# Patient Record
Sex: Male | Born: 1935 | Race: White | Hispanic: No | Smoking: Never smoker
Health system: Southern US, Community
[De-identification: ages and names within clinical notes are randomized; demographics above are authoritative.]

## PROBLEM LIST (undated history)

## (undated) DIAGNOSIS — C4490 Unspecified malignant neoplasm of skin, unspecified: Secondary | ICD-10-CM

## (undated) DIAGNOSIS — E785 Hyperlipidemia, unspecified: Secondary | ICD-10-CM

## (undated) DIAGNOSIS — N400 Enlarged prostate without lower urinary tract symptoms: Secondary | ICD-10-CM

## (undated) DIAGNOSIS — R7303 Prediabetes: Secondary | ICD-10-CM

## (undated) DIAGNOSIS — I10 Essential (primary) hypertension: Secondary | ICD-10-CM

## (undated) DIAGNOSIS — M1990 Unspecified osteoarthritis, unspecified site: Secondary | ICD-10-CM

## (undated) DIAGNOSIS — E559 Vitamin D deficiency, unspecified: Secondary | ICD-10-CM

## (undated) DIAGNOSIS — C679 Malignant neoplasm of bladder, unspecified: Secondary | ICD-10-CM

## (undated) HISTORY — DX: Malignant neoplasm of bladder, unspecified: C67.9

## (undated) HISTORY — DX: Essential (primary) hypertension: I10

## (undated) HISTORY — PX: CATARACT EXTRACTION: SUR2

## (undated) HISTORY — DX: Benign prostatic hyperplasia without lower urinary tract symptoms: N40.0

## (undated) HISTORY — DX: Hyperlipidemia, unspecified: E78.5

## (undated) HISTORY — DX: Prediabetes: R73.03

## (undated) HISTORY — DX: Unspecified osteoarthritis, unspecified site: M19.90

## (undated) HISTORY — DX: Vitamin D deficiency, unspecified: E55.9

## (undated) HISTORY — PX: TONSILLECTOMY: SUR1361

---

## 1997-06-03 ENCOUNTER — Other Ambulatory Visit: Admission: RE | Admit: 1997-06-03 | Discharge: 1997-06-03 | Payer: Self-pay | Admitting: *Deleted

## 1997-07-06 ENCOUNTER — Ambulatory Visit (HOSPITAL_COMMUNITY): Admission: RE | Admit: 1997-07-06 | Discharge: 1997-07-06 | Payer: Self-pay | Admitting: *Deleted

## 1997-08-04 ENCOUNTER — Other Ambulatory Visit: Admission: RE | Admit: 1997-08-04 | Discharge: 1997-08-04 | Payer: Self-pay | Admitting: *Deleted

## 1997-09-06 ENCOUNTER — Other Ambulatory Visit: Admission: RE | Admit: 1997-09-06 | Discharge: 1997-09-06 | Payer: Self-pay | Admitting: *Deleted

## 1999-08-15 ENCOUNTER — Ambulatory Visit (HOSPITAL_COMMUNITY): Admission: RE | Admit: 1999-08-15 | Discharge: 1999-08-15 | Payer: Self-pay | Admitting: *Deleted

## 2000-01-22 ENCOUNTER — Encounter: Payer: Self-pay | Admitting: Internal Medicine

## 2000-01-22 ENCOUNTER — Encounter: Admission: RE | Admit: 2000-01-22 | Discharge: 2000-01-22 | Payer: Self-pay | Admitting: Internal Medicine

## 2001-02-18 DIAGNOSIS — C679 Malignant neoplasm of bladder, unspecified: Secondary | ICD-10-CM

## 2001-02-18 HISTORY — PX: CYSTO: SHX6284

## 2001-02-18 HISTORY — DX: Malignant neoplasm of bladder, unspecified: C67.9

## 2001-08-03 ENCOUNTER — Encounter: Payer: Self-pay | Admitting: Urology

## 2001-08-03 ENCOUNTER — Encounter: Admission: RE | Admit: 2001-08-03 | Discharge: 2001-08-03 | Payer: Self-pay | Admitting: Urology

## 2001-08-05 ENCOUNTER — Encounter (INDEPENDENT_AMBULATORY_CARE_PROVIDER_SITE_OTHER): Payer: Self-pay | Admitting: Specialist

## 2001-08-05 ENCOUNTER — Ambulatory Visit (HOSPITAL_BASED_OUTPATIENT_CLINIC_OR_DEPARTMENT_OTHER): Admission: RE | Admit: 2001-08-05 | Discharge: 2001-08-05 | Payer: Self-pay | Admitting: Urology

## 2003-02-25 ENCOUNTER — Encounter: Admission: RE | Admit: 2003-02-25 | Discharge: 2003-02-25 | Payer: Self-pay | Admitting: Urology

## 2003-03-02 ENCOUNTER — Ambulatory Visit (HOSPITAL_BASED_OUTPATIENT_CLINIC_OR_DEPARTMENT_OTHER): Admission: RE | Admit: 2003-03-02 | Discharge: 2003-03-02 | Payer: Self-pay | Admitting: Urology

## 2003-03-02 ENCOUNTER — Encounter (INDEPENDENT_AMBULATORY_CARE_PROVIDER_SITE_OTHER): Payer: Self-pay | Admitting: Specialist

## 2003-03-02 ENCOUNTER — Ambulatory Visit (HOSPITAL_COMMUNITY): Admission: RE | Admit: 2003-03-02 | Discharge: 2003-03-02 | Payer: Self-pay | Admitting: Urology

## 2004-06-12 ENCOUNTER — Encounter: Admission: RE | Admit: 2004-06-12 | Discharge: 2004-06-12 | Payer: Self-pay | Admitting: Urology

## 2004-06-20 ENCOUNTER — Ambulatory Visit (HOSPITAL_BASED_OUTPATIENT_CLINIC_OR_DEPARTMENT_OTHER): Admission: RE | Admit: 2004-06-20 | Discharge: 2004-06-20 | Payer: Self-pay | Admitting: Urology

## 2004-06-20 ENCOUNTER — Encounter (INDEPENDENT_AMBULATORY_CARE_PROVIDER_SITE_OTHER): Payer: Self-pay | Admitting: Specialist

## 2004-06-20 ENCOUNTER — Ambulatory Visit (HOSPITAL_COMMUNITY): Admission: RE | Admit: 2004-06-20 | Discharge: 2004-06-20 | Payer: Self-pay | Admitting: Urology

## 2005-06-06 ENCOUNTER — Ambulatory Visit (HOSPITAL_COMMUNITY): Admission: RE | Admit: 2005-06-06 | Discharge: 2005-06-06 | Payer: Self-pay | Admitting: Internal Medicine

## 2005-12-11 ENCOUNTER — Encounter (INDEPENDENT_AMBULATORY_CARE_PROVIDER_SITE_OTHER): Payer: Self-pay | Admitting: *Deleted

## 2005-12-11 ENCOUNTER — Ambulatory Visit (HOSPITAL_BASED_OUTPATIENT_CLINIC_OR_DEPARTMENT_OTHER): Admission: RE | Admit: 2005-12-11 | Discharge: 2005-12-11 | Payer: Self-pay | Admitting: Urology

## 2009-10-13 ENCOUNTER — Ambulatory Visit (HOSPITAL_BASED_OUTPATIENT_CLINIC_OR_DEPARTMENT_OTHER): Admission: RE | Admit: 2009-10-13 | Discharge: 2009-10-13 | Payer: Self-pay | Admitting: Urology

## 2010-05-03 LAB — POCT I-STAT, CHEM 8
BUN: 31 mg/dL — ABNORMAL HIGH (ref 6–23)
HCT: 39 % (ref 39.0–52.0)
Hemoglobin: 13.3 g/dL (ref 13.0–17.0)
Potassium: 3.9 mEq/L (ref 3.5–5.1)
Sodium: 142 mEq/L (ref 135–145)
TCO2: 23 mmol/L (ref 0–100)

## 2010-07-06 NOTE — Op Note (Signed)
NAMEBENEN, WEIDA                   ACCOUNT NO.:  [id number]   MEDICAL RECORD NO.:  [id number]          PATIENT TYPE:  AMB   LOCATION:  [facility]                         FACILITY:  [facility]   PHYSICIAN:  Maretta Bees. Vonita Moss, M.D.DATE OF BIRTH:  1922-09-07   DATE OF PROCEDURE:  06/20/2004  DATE OF DISCHARGE:                                 OPERATIVE REPORT   PREOPERATIVE DIAGNOSIS:  Rule out recurrent bladder carcinoma.   POSTOPERATIVE DIAGNOSIS:  Rule out recurrent bladder carcinoma.   PROCEDURE:  1.  Cystoscopy.  2.  Bilateral retrograde pyelograms with interpretation.  3.  Cold cup bladder biopsy.   SURGEON:  Larey Dresser, M.D.   ANESTHESIA:  General.   INDICATIONS:  This 77 year old gentleman had bladder carcinoma resected in  June of 2003 and also had bladder biopsies done in January of 2005.  He has  got some erythematous areas in the bladder and hopefully this would just be  reflective of the inflammation and reactive tissue in his January, 2005  biopsy.   PROCEDURE:  The patient was brought to the operating room and placed in the  lithotomy position.  External genitalia were prepped and draped in the usual  fashion using a cystoscope.  The anterior and prostatic urethra were  unremarkable.   Bilateral retrograde pyelograms were obtained using the cone tip catheter  and there was no evidence of obstruction or filling defects in either upper  tract.   He then underwent cold cup bladder biopsy of a tiny frond-like lesion on the  trigone, this was about no more than 0.5 cm in size.  There were two areas  that were also biopsied that were erythematous and measuring about 1 to 1.5  cm each.  One was on the dome and one was on the right wall and  representative areas of each one were biopsied  with the cold cup biopsy forceps and the surrounding erythematous mucosa and  biopsy site were completely fulgurated with the Bugbee electrode.  At this  point, the bladder was emptied, the  scope removed and the patient sent to  the recovery room in good condition having tolerated the procedure well.      LJP/MEDQ  D:  06/20/2004  T:  06/20/2004  Job:  [id number]   cc:   Lovenia Kim, D.O.  [address]  [place]  [place] [address]  Fax: [phone]

## 2010-07-06 NOTE — Op Note (Signed)
Philip Simpson, Philip Simpson                   ACCOUNT NO.:  [id number]   MEDICAL RECORD NO.:  [id number]          PATIENT TYPE:  AMB   LOCATION:  [facility]                         FACILITY:  [facility]   PHYSICIAN:  Maretta Bees. Vonita Moss, M.D.DATE OF BIRTH:  19-Jan-1923   DATE OF PROCEDURE:  12/11/2005  DATE OF DISCHARGE:                                 OPERATIVE REPORT   PREOPERATIVE DIAGNOSIS:  Rule out recurrent bladder carcinoma.   POSTOPERATIVE DIAGNOSIS:  Rule out recurrent bladder carcinoma.   PROCEDURE:  Cystoscopy, bilateral retrograde pyelograms with interpretation  and cold cup bladder biopsy.   SURGEON:  Dr. Larey Dresser.   ANESTHESIA:  General.   INDICATIONS:  This is an 77 year old gentleman has had a history of bladder  carcinoma dating back to 2003 and has had a follow-up biopsy showing  inflammation and reactive tissue.  His last biopsy procedure was May 2006.  Recent cystoscopy revealed some couple of erythematous areas and some small  raised area in the dome suspicious for tumor.  Brought to the OR today for  further treatment and evaluation.   PROCEDURE:  The patient brought to the operating room, placed lithotomy  position.  External genitalia were prepped and draped in usual fashion.  He  was cystoscoped.  The anterior and prostatic urethras were unremarkable.  He  had an erythematous area on the anterior wall and a couple of erythematous  areas on the dome one of which was slightly raised mucosa.   Bilateral retrograde pyelograms were performed by injecting contrast through  the cystoscope by means of a cone-tip catheter.  The pyelocaliceal systems  and ureters were delicate without filling defects.  The calyces were full  which I think reflect a variation of normal and no obstruction.   The cold cup bladder biopsy forceps was used to biopsy the two areas on the  dome and the one area on the anterior wall, each of which was 0.5 to 1 cm in  size.  The biopsy sites were  fulgurated with the Bugbee electrode.  At this  point there was no evidence of other lesions noted.  There was good  hemostasis.  Bladder was emptied. the scope removed and the patient sent to  recovery room in good condition having tolerated the procedure well.      Maretta Bees. Vonita Moss, M.D.  Electronically Signed     LJP/MEDQ  D:  12/11/2005  T:  12/12/2005  Job:  [id number]

## 2010-07-06 NOTE — Op Note (Signed)
NAME:  Philip Simpson, Philip Simpson                             ACCOUNT NO.:  [id number]   MEDICAL RECORD NO.:  [id number]                   PATIENT TYPE:  AMB   LOCATION:  [facility]                                 FACILITY:  [facility]   PHYSICIAN:  Maretta Bees. Vonita Moss, M.D.             DATE OF BIRTH:  Dec 30, 1922   DATE OF PROCEDURE:  03/02/2003  DATE OF DISCHARGE:                                 OPERATIVE REPORT   PREOPERATIVE DIAGNOSIS:  Rule out recurrent bladder carcinoma.   POSTOPERATIVE DIAGNOSIS:  Rule out recurrent bladder carcinoma.   PROCEDURE:  Cystoscopy, bilateral retrograde pyelograms with interpretation  and cold cup bladder biopsies.   SURGEON:  Maretta Bees. Vonita Moss, M.D.   ANESTHESIA:  General.   INDICATIONS FOR PROCEDURE:  This 77 year old white male had a bladder  carcinoma resected in June of 2003.  It was lateral to the right ureteral  orifice. He has been cystoscope in followup and there has been no evidence  of recurrences.  On Cysto on February 08, 2003, there were a couple of  erythematous area and an NMP-22 was done which was positive which lead to  his evaluation today.   DESCRIPTION OF PROCEDURE:  The patient was brought to the operating room,  placed in lithotomy position, external genitalia were prepped and draped in  the usual fashion. He was cystoscope, the anterior urethra was normal, the  prostate had a partial lateral lobe hypertrophy.  The bladder had some mild  erythematous areas on the right lower bladder wall and one small  erythematous area on the base.  I performed bilateral retrograde pyelograms  using the cone tip catheter in the ureters and pyelocaliceal systems had no  filling defects or hydronephrosis. There was some extravasation of contrast  in the perirenal region at the end of the procedure but that should not  cause any problems.  I then perform cold cup bladder biopsies taking two  specimens from the right lower wall, one specimen from the base and  erythematous areas and a random biopsy from the left wall.  Biopsies sites  were fulgurated with the Bugbee electrode and at this point the bladder was  emptied, the scope removed and there was good hemostasis with no blood loss  and he tolerated the procedure well.                                               Maretta Bees. Vonita Moss, M.D.    LJP/MEDQ  D:  03/02/2003  T:  03/02/2003  Job:  [id number]   cc:   Lovenia Kim, D.O.  [address]  [place]  [place] [address]  Fax: [phone]

## 2010-07-06 NOTE — Op Note (Signed)
[facility]  Patient:    Philip Simpson, Philip Simpson Visit Number: [id number] MRN: [id number]          Service Type: N[facility] Location: [facility] Attending Physician:  Lauree Chandler Dictated by:   Maretta Bees. Vonita Moss, M.D. Proc. Date: 08/05/01 Admit Date:  08/05/2001                             Operative Report  PREOPERATIVE DIAGNOSIS:  Bladder carcinoma.  POSTOPERATIVE DIAGNOSIS:  Bladder carcinoma.  OPERATION:  Cystoscopy, bilateral retrograde pyelogram with interpretation and resection and fulguration of bladder tumor.  SURGEON:  Maretta Bees. Vonita Moss, M.D.  ANESTHESIA:  General  INDICATIONS:  This 77 year old, gentleman has had workup for hematuria and a renal ultrasound was unremarkable except for right renal cyst.  Cystoscopy revealed a slightly raised pinkish lesion lateral to the right ureteral orifice which was worrisome for bladder carcinoma.  He is brought to the ER today for further treatment therapy.  DESCRIPTION OF PROCEDURE:  The patient was brought to the operating room placed in the lithotomy position.  External genitalia were prepped and draped in the usual fashion.  He was cystoscoped.  The anterior urethra was normal. The prostate had partial obstruction.  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  Bilateral retrograde pyelogram were obtained using the cone tip catheter and the upper tracts were unremarkable with no stones, obstruction or filling defects.  I then used the cold cup bladder biopsy forceps to biopsy and resect representative areas of this tumor.  This was sent for pathology.  The unresected areas and surrounding mucosa were fulgurated with the Bugbee electrode and this encompassed about and area 2 cm in diameter.  At this point, there were no other lesions in the  bladder.  The ureteral orifices were intact.  Bladder was emptied.  The scope removed and the patient sent to the recovery room in good condition, having tolerated the procedure well. Dictated by:   Maretta Bees. Vonita Moss, M.D. Attending Physician:  Lauree Chandler DD:  08/05/01 TD:  08/06/01 Job: [id number]

## 2011-02-19 DIAGNOSIS — C4490 Unspecified malignant neoplasm of skin, unspecified: Secondary | ICD-10-CM

## 2011-02-19 HISTORY — DX: Unspecified malignant neoplasm of skin, unspecified: C44.90

## 2011-10-23 ENCOUNTER — Other Ambulatory Visit: Payer: Self-pay | Admitting: Dermatology

## 2013-01-27 ENCOUNTER — Encounter: Payer: Self-pay | Admitting: Internal Medicine

## 2013-01-27 DIAGNOSIS — E785 Hyperlipidemia, unspecified: Secondary | ICD-10-CM | POA: Insufficient documentation

## 2013-01-27 DIAGNOSIS — N400 Enlarged prostate without lower urinary tract symptoms: Secondary | ICD-10-CM | POA: Insufficient documentation

## 2013-01-27 DIAGNOSIS — R7303 Prediabetes: Secondary | ICD-10-CM | POA: Insufficient documentation

## 2013-01-27 DIAGNOSIS — I10 Essential (primary) hypertension: Secondary | ICD-10-CM | POA: Insufficient documentation

## 2013-01-27 DIAGNOSIS — E559 Vitamin D deficiency, unspecified: Secondary | ICD-10-CM | POA: Insufficient documentation

## 2013-01-27 DIAGNOSIS — M1990 Unspecified osteoarthritis, unspecified site: Secondary | ICD-10-CM | POA: Insufficient documentation

## 2013-02-02 ENCOUNTER — Encounter: Payer: Self-pay | Admitting: Internal Medicine

## 2013-02-02 ENCOUNTER — Ambulatory Visit (INDEPENDENT_AMBULATORY_CARE_PROVIDER_SITE_OTHER): Payer: Medicare PPO | Admitting: Internal Medicine

## 2013-02-02 VITALS — BP 140/62 | HR 72 | Temp 97.9°F | Resp 16 | Wt 172.8 lb

## 2013-02-02 DIAGNOSIS — Z79899 Other long term (current) drug therapy: Secondary | ICD-10-CM

## 2013-02-02 DIAGNOSIS — I10 Essential (primary) hypertension: Secondary | ICD-10-CM

## 2013-02-02 DIAGNOSIS — R7309 Other abnormal glucose: Secondary | ICD-10-CM

## 2013-02-02 DIAGNOSIS — E559 Vitamin D deficiency, unspecified: Secondary | ICD-10-CM

## 2013-02-02 DIAGNOSIS — E782 Mixed hyperlipidemia: Secondary | ICD-10-CM

## 2013-02-02 LAB — CBC WITH DIFFERENTIAL/PLATELET
Basophils Relative: 1 % (ref 0–1)
Eosinophils Absolute: 0.1 10*3/uL (ref 0.0–0.7)
Lymphocytes Relative: 19 % (ref 12–46)
Lymphs Abs: 1 10*3/uL (ref 0.7–4.0)
MCHC: 33.7 g/dL (ref 30.0–36.0)
MCV: 97.3 fL (ref 78.0–100.0)
Monocytes Absolute: 0.5 10*3/uL (ref 0.1–1.0)
Monocytes Relative: 9 % (ref 3–12)
Neutro Abs: 3.8 10*3/uL (ref 1.7–7.7)
Platelets: 201 10*3/uL (ref 150–400)
RBC: 3.69 MIL/uL — ABNORMAL LOW (ref 4.22–5.81)

## 2013-02-02 NOTE — Progress Notes (Signed)
Patient ID: Philip Simpson, male   DOB: Nov 22, 1922, 79 y.o.   MRN: [id number]   This very nice 79 yo WWM living alone who presents for 3 month follow up with Hypertension, Hyperlipidemia, Pre-Diabetes and Vitamin D Deficiency.    BP has been controlled at home. Today's BP is 140/62. Patient denies any cardiac type chest pain, palpitations, dyspnea/orthopnea/PND, dizziness, claudication, or dependent edema.   Hyperlipidemia is controlled with diet & meds. Last Cholesterol was 138, Triglycerides were 86, HDL 56, and LDL 71 in September. Patient denies myalgias or other med SE's.    Also, the patient has history of PreDiabetes/insulin resistance with last A1c of 6.0% in September. Patient denies any symptoms of reactive hypoglycemia, diabetic polys, paresthesias or visual blurring.   Further, Patient has history of Vitamin D Deficiency with last vitamin D of 34 in September. Patient supplements vitamin D without any suspected side-effects.  Medication Sig Dispense Refill  . amLODipine (NORVASC) 5 MG tablet Take 5 mg by mouth daily.      . benazepril (LOTENSIN) 40 MG tablet Take 40 mg by mouth daily.      . Cholecalciferol (VITAMIN D PO) Take 4,000 Int'l Units by mouth daily.      Marland Kitchen gemfibrozil (LOPID) 600 MG tablet Take 600 mg by mouth 2 (two) times daily before a meal.      . HYDROcodone-acetaminophen (NORCO/VICODIN) 5-325 MG per tablet Take 1 tablet by mouth every 4 (four) hours as needed for moderate pain.       Advil 200 mg  Takes 2-3 tabs daily           . terazosin (HYTRIN) 10 MG capsule Take 10 mg by mouth 2 (two) times daily.         Allergies  Allergen Reactions  . Baycol [Cerivastatin]   . Zocor [Simvastatin]     PMHx:   Past Medical History  Diagnosis Date  . Hyperlipidemia   . Hypertension   . Arthritis   . Bladder cancer 2003  . Prediabetes   . Vitamin D deficiency   . BPH (benign prostatic hyperplasia)     FHx:    Reviewed / unchanged  SHx:    Reviewed /  unchanged  Systems Review: Constitutional: Denies fever, chills, wt changes, headaches, insomnia, fatigue, night sweats, change in appetite. Eyes: Denies redness, blurred vision, diplopia, discharge, itchy, watery eyes.  ENT: Denies discharge, congestion, post nasal drip, epistaxis, sore throat, earache, hearing loss, dental pain, tinnitus, vertigo, sinus pain, snoring.  CV: Denies chest pain, palpitations, irregular heartbeat, syncope, dyspnea, diaphoresis, orthopnea, PND, claudication, edema. Respiratory: denies cough, dyspnea, DOE, pleurisy, hoarseness, laryngitis, wheezing.  Gastrointestinal: Denies dysphagia, odynophagia, heartburn, reflux, water brash, abdominal pain or cramps, nausea, vomiting, bloating, diarrhea, constipation, hematemesis, melena, hematochezia,  or hemorrhoids. Genitourinary: Denies dysuria, frequency, urgency, nocturia, hesitancy, discharge, hematuria, flank pain. Musculoskeletal: Denies arthralgias, myalgias, stiffness, jt. swelling, pain, limp, strain/sprain.  Skin: Denies pruritus, rash, hives, warts, acne, eczema, change in skin lesion(s). Neuro: No weakness, tremor, incoordination, spasms, paresthesia, or pain. Psychiatric: Denies confusion, memory loss, or sensory loss. Endo: Denies change in weight, skin, hair change.  Heme/Lymph: No excessive bleeding, bruising, orenlarged lymph nodes.  Filed Vitals:   02/02/13 1532  BP: 140/62  Pulse: 72  Temp: 97.9 F (36.6 C)  Resp: 16    There is no height on file to calculate BMI.  On Exam: Appears well nourished - in no distress. Eyes: PERRLA, EOMs, conjunctiva no swelling or erythema. Sinuses: No  frontal/maxillary tenderness ENT/Mouth: EAC's clear, TM's nl w/o erythema, bulging. Nares clear w/o erythema, swelling, exudates. Oropharynx clear without erythema or exudates. Oral hygiene is good. Tongue normal, non obstructing. Hearing intact.  Neck: Supple. Thyroid nl. Car 2+/2+ without bruits, nodes or  JVD. Chest: Respirations nl with BS clear & equal w/o rales, rhonchi, wheezing or stridor.  Cor: Heart sounds normal w/ regular rate and rhythm without sig. murmurs, gallops, clicks, or rubs. Peripheral pulses normal and equal  without edema.  Abdomen: Soft & bowel sounds normal. Non-tender w/o guarding, rebound, hernias, masses, or organomegaly.  Lymphatics: Unremarkable.  Musculoskeletal: Full ROM all peripheral extremities, joint stability, 5/5 strength, and normal gait.  Skin: Warm, dry without exposed rashes, lesions, ecchymosis apparent.  Neuro: Cranial nerves intact, reflexes equal bilaterally. Sensory-motor testing grossly intact. Tendon reflexes grossly intact.  Pysch: Alert & oriented x 3. Insight and judgement nl & appropriate. No ideations.  Assessment and Plan:  1. Hypertension - Continue monitor blood pressure at home. Continue diet/meds same.  2. Hyperlipidemia - Continue diet/meds, exercise,& lifestyle modifications. Continue monitor periodic cholesterol/liver & renal functions   3. Pre-diabetes/Insulin Resistance - Continue diet, exercise, lifestyle modifications. Monitor appropriate labs.  4. Vitamin D Deficiency - Continue supplementation.  Recommended regular exercise, BP monitoring, weight control, and discussed med and SE's. Recommended labs to assess and monitor clinical status. Further disposition pending results of labs.

## 2013-02-02 NOTE — Patient Instructions (Signed)
Hypertension As your heart beats, it forces blood through your arteries. This force is your blood pressure. If the pressure is too high, it is called hypertension (HTN) or high blood pressure. HTN is dangerous because you may have it and not know it. High blood pressure may mean that your heart has to work harder to pump blood. Your arteries may be narrow or stiff. The extra work puts you at risk for heart disease, stroke, and other problems.  Blood pressure consists of two numbers, a higher number over a lower, 110/72, for example. It is stated as "110 over 72." The ideal is below 120 for the top number (systolic) and under 80 for the bottom (diastolic). Write down your blood pressure today. You should pay close attention to your blood pressure if you have certain conditions such as:  Heart failure.  Prior heart attack.  Diabetes  Chronic kidney disease.  Prior stroke.  Multiple risk factors for heart disease. To see if you have HTN, your blood pressure should be measured while you are seated with your arm held at the level of the heart. It should be measured at least twice. A one-time elevated blood pressure reading (especially in the Emergency Department) does not mean that you need treatment. There may be conditions in which the blood pressure is different between your right and left arms. It is important to see your caregiver soon for a recheck. Most people have essential hypertension which means that there is not a specific cause. This type of high blood pressure may be lowered by changing lifestyle factors such as:  Stress.  Smoking.  Lack of exercise.  Excessive weight.  Drug/tobacco/alcohol use.  Eating less salt. Most people do not have symptoms from high blood pressure until it has caused damage to the body. Effective treatment can often prevent, delay or reduce that damage. TREATMENT  When a cause has been identified, treatment for high blood pressure is directed at the  cause. There are a large number of medications to treat HTN. These fall into several categories, and your caregiver will help you select the medicines that are best for you. Medications may have side effects. You should review side effects with your caregiver. If your blood pressure stays high after you have made lifestyle changes or started on medicines,   Your medication(s) may need to be changed.  Other problems may need to be addressed.  Be certain you understand your prescriptions, and know how and when to take your medicine.  Be sure to follow up with your caregiver within the time frame advised (usually within two weeks) to have your blood pressure rechecked and to review your medications.  If you are taking more than one medicine to lower your blood pressure, make sure you know how and at what times they should be taken. Taking two medicines at the same time can result in blood pressure that is too low. SEEK IMMEDIATE MEDICAL CARE IF:  You develop a severe headache, blurred or changing vision, or confusion.  You have unusual weakness or numbness, or a faint feeling.  You have severe chest or abdominal pain, vomiting, or breathing problems. MAKE SURE YOU:   Understand these instructions.  Will watch your condition.  Will get help right away if you are not doing well or get worse. Document Released: 02/04/2005 Document Revised: 04/29/2011 Document Reviewed: 09/25/2007 [employer] Patient Information 2014 [employer].  Diabetes and Exercise Exercising regularly is important. It is not just about losing weight. It   has many health benefits, such as:  Improving your overall fitness, flexibility, and endurance.  Increasing your bone density.  Helping with weight control.  Decreasing your body fat.  Increasing your muscle strength.  Reducing stress and tension.  Improving your overall health. People with diabetes who exercise gain additional benefits because  exercise:  Reduces appetite.  Improves the body's use of blood sugar (glucose).  Helps lower or control blood glucose.  Decreases blood pressure.  Helps control blood lipids (such as cholesterol and triglycerides).  Improves the body's use of the hormone insulin by:  Increasing the body's insulin sensitivity.  Reducing the body's insulin needs.  Decreases the risk for heart disease because exercising:  Lowers cholesterol and triglycerides levels.  Increases the levels of good cholesterol (such as high-density lipoproteins [HDL]) in the body.  Lowers blood glucose levels. YOUR ACTIVITY PLAN  Choose an activity that you enjoy and set realistic goals. Your health care provider or diabetes educator can help you make an activity plan that works for you. You can break activities into 2 or 3 sessions throughout the day. Doing so is as good as one long session. Exercise ideas include:  Taking the dog for a walk.  Taking the stairs instead of the elevator.  Dancing to your favorite song.  Doing your favorite exercise with a friend. RECOMMENDATIONS FOR EXERCISING WITH TYPE 1 OR TYPE 2 DIABETES   Check your blood glucose before exercising. If blood glucose levels are greater than 240 mg/dL, check for urine ketones. Do not exercise if ketones are present.  Avoid injecting insulin into areas of the body that are going to be exercised. For example, avoid injecting insulin into:  The arms when playing tennis.  The legs when jogging.  Keep a record of:  Food intake before and after you exercise.  Expected peak times of insulin action.  Blood glucose levels before and after you exercise.  The type and amount of exercise you have done.  Review your records with your health care provider. Your health care provider will help you to develop guidelines for adjusting food intake and insulin amounts before and after exercising.  If you take insulin or oral hypoglycemic agents, watch  for signs and symptoms of hypoglycemia. They include:  Dizziness.  Shaking.  Sweating.  Chills.  Confusion.  Drink plenty of water while you exercise to prevent dehydration or heat stroke. Body water is lost during exercise and must be replaced.  Talk to your health care provider before starting an exercise program to make sure it is safe for you. Remember, almost any type of activity is better than none. Document Released: 04/27/2003 Document Revised: 10/07/2012 Document Reviewed: 07/14/2012 [employer] Patient Information 2014 [employer].  Cholesterol Cholesterol is a white, waxy, fat-like protein needed by your body in small amounts. The liver makes all the cholesterol you need. It is carried from the liver by the blood through the blood vessels. Deposits (plaque) may build up on blood vessel walls. This makes the arteries narrower and stiffer. Plaque increases the risk for heart attack and stroke. You cannot feel your cholesterol level even if it is very high. The only way to know is by a blood test to check your lipid (fats) levels. Once you know your cholesterol levels, you should keep a record of the test results. Work with your caregiver to to keep your levels in the desired range. WHAT THE RESULTS MEAN:  Total cholesterol is a rough measure of all the cholesterol   in your blood.  LDL is the so-called bad cholesterol. This is the type that deposits cholesterol in the walls of the arteries. You want this level to be low.  HDL is the good cholesterol because it cleans the arteries and carries the LDL away. You want this level to be high.  Triglycerides are fat that the body can either burn for energy or store. High levels are closely linked to heart disease. DESIRED LEVELS:  Total cholesterol below 200.  LDL below 100 for people at risk, below 70 for very high risk.  HDL above 50 is good, above 60 is best.  Triglycerides below 150. HOW TO LOWER YOUR  CHOLESTEROL:  Diet.  Choose fish or white meat chicken and turkey, roasted or baked. Limit fatty cuts of red meat, fried foods, and processed meats, such as sausage and lunch meat.  Eat lots of fresh fruits and vegetables. Choose whole grains, beans, pasta, potatoes and cereals.  Use only small amounts of olive, corn or canola oils. Avoid butter, mayonnaise, shortening or palm kernel oils. Avoid foods with trans-fats.  Use skim/nonfat milk and low-fat/nonfat yogurt and cheeses. Avoid whole milk, cream, ice cream, egg yolks and cheeses. Healthy desserts include angel food cake, ginger snaps, animal crackers, hard candy, popsicles, and low-fat/nonfat frozen yogurt. Avoid pastries, cakes, pies and cookies.  Exercise.  A regular program helps decrease LDL and raises HDL.  Helps with weight control.  Do things that increase your activity level like gardening, walking, or taking the stairs.  Medication.  May be prescribed by your caregiver to help lowering cholesterol and the risk for heart disease.  You may need medicine even if your levels are normal if you have several risk factors. HOME CARE INSTRUCTIONS   Follow your diet and exercise programs as suggested by your caregiver.  Take medications as directed.  Have blood work done when your caregiver feels it is necessary. MAKE SURE YOU:   Understand these instructions.  Will watch your condition.  Will get help right away if you are not doing well or get worse. Document Released: 10/30/2000 Document Revised: 04/29/2011 Document Reviewed: 04/22/2007 [employer] Patient Information 2014 [employer].  Vitamin D Deficiency Vitamin D is an important vitamin that your body needs. Having too little of it in your body is called a deficiency. A very bad deficiency can make your bones soft and can cause a condition called rickets.  Vitamin D is important to your body for different reasons, such as:   It helps your body absorb 2  minerals called calcium and phosphorus.  It helps make your bones healthy.  It may prevent some diseases, such as diabetes and multiple sclerosis.  It helps your muscles and heart. You can get vitamin D in several ways. It is a natural part of some foods. The vitamin is also added to some dairy products and cereals. Some people take vitamin D supplements. Also, your body makes vitamin D when you are in the sun. It changes the sun's rays into a form of the vitamin that your body can use. CAUSES   Not eating enough foods that contain vitamin D.  Not getting enough sunlight.  Having certain digestive system diseases that make it hard to absorb vitamin D. These diseases include Crohn's disease, chronic pancreatitis, and cystic fibrosis.  Having a surgery in which part of the stomach or small intestine is removed.  Being obese. Fat cells pull vitamin D out of your blood. That means that obese people   may not have enough vitamin D left in their blood and in other body tissues.  Having chronic kidney or liver disease. RISK FACTORS Risk factors are things that make you more likely to develop a vitamin D deficiency. They include:  Being older.  Not being able to get outside very much.  Living in a nursing home.  Having had broken bones.  Having weak or thin bones (osteoporosis).  Having a disease or condition that changes how your body absorbs vitamin D.  Having dark skin.  Some medicines such as seizure medicines or steroids.  Being overweight or obese. SYMPTOMS Mild cases of vitamin D deficiency may not have any symptoms. If you have a very bad case, symptoms may include:  Bone pain.  Muscle pain.  Falling often.  Broken bones caused by a minor injury, due to osteoporosis. DIAGNOSIS A blood test is the best way to tell if you have a vitamin D deficiency. TREATMENT Vitamin D deficiency can be treated in different ways. Treatment for vitamin D deficiency depends on what is  causing it. Options include:  Taking vitamin D supplements.  Taking a calcium supplement. Your caregiver will suggest what dose is best for you. HOME CARE INSTRUCTIONS  Take any supplements that your caregiver prescribes. Follow the directions carefully. Take only the suggested amount.  Have your blood tested 2 months after you start taking supplements.  Eat foods that contain vitamin D. Healthy choices include:  Fortified dairy products, cereals, or juices. Fortified means vitamin D has been added to the food. Check the label on the package to be sure.  Fatty fish like salmon or trout.  Eggs.  Oysters.  Do not use a tanning bed.  Keep your weight at a healthy level. Lose weight if you need to.  Keep all follow-up appointments. Your caregiver will need to perform blood tests to make sure your vitamin D deficiency is going away. SEEK MEDICAL CARE IF:  You have any questions about your treatment.  You continue to have symptoms of vitamin D deficiency.  You have nausea or vomiting.  You are constipated.  You feel confused.  You have severe abdominal or back pain. MAKE SURE YOU:  Understand these instructions.  Will watch your condition.  Will get help right away if you are not doing well or get worse. Document Released: 04/29/2011 Document Revised: 06/01/2012 Document Reviewed: 04/29/2011 [employer] Patient Information 2014 [employer].  

## 2013-02-03 LAB — LIPID PANEL
Cholesterol: 135 mg/dL (ref 0–200)
HDL: 46 mg/dL (ref >39)
Total CHOL/HDL Ratio: 2.9 Ratio
Triglycerides: 94 mg/dL (ref <150)
VLDL: 19 mg/dL (ref 0–40)

## 2013-02-03 LAB — HEPATIC FUNCTION PANEL
ALT: 11 U/L (ref 0–53)
AST: 16 U/L (ref 0–37)
Albumin: 4.3 g/dL (ref 3.5–5.2)
Total Protein: 6.7 g/dL (ref 6.0–8.3)

## 2013-02-03 LAB — BASIC METABOLIC PANEL WITH GFR
BUN: 35 mg/dL — ABNORMAL HIGH (ref 6–23)
Chloride: 106 mEq/L (ref 96–112)
GFR, Est African American: 71 mL/min
GFR, Est Non African American: 62 mL/min
Sodium: 138 mEq/L (ref 135–145)

## 2013-02-03 LAB — HEMOGLOBIN A1C: Hgb A1c MFr Bld: 5.8 % — ABNORMAL HIGH (ref <5.7)

## 2013-02-03 LAB — MAGNESIUM: Magnesium: 2.1 mg/dL (ref 1.5–2.5)

## 2013-02-03 LAB — VITAMIN D 25 HYDROXY (VIT D DEFICIENCY, FRACTURES): Vit D, 25-Hydroxy: 23 ng/mL — ABNORMAL LOW (ref 30–89)

## 2013-02-03 LAB — INSULIN, FASTING: Insulin fasting, serum: 30 u[IU]/mL — ABNORMAL HIGH (ref 3–28)

## 2013-02-09 ENCOUNTER — Other Ambulatory Visit: Payer: Self-pay | Admitting: Internal Medicine

## 2013-04-25 ENCOUNTER — Emergency Department (HOSPITAL_COMMUNITY): Payer: Medicare PPO

## 2013-04-25 ENCOUNTER — Inpatient Hospital Stay (HOSPITAL_COMMUNITY)
Admission: EM | Admit: 2013-04-25 | Discharge: 2013-05-05 | DRG: 981 | Disposition: A | Discharge status: Skilled Nursing Facility | Payer: Medicare PPO | Attending: Family Medicine | Admitting: Family Medicine

## 2013-04-25 ENCOUNTER — Encounter (HOSPITAL_COMMUNITY): Payer: Self-pay | Admitting: Emergency Medicine

## 2013-04-25 DIAGNOSIS — R571 Hypovolemic shock: Secondary | ICD-10-CM

## 2013-04-25 DIAGNOSIS — Z602 Problems related to living alone: Secondary | ICD-10-CM

## 2013-04-25 DIAGNOSIS — F29 Unspecified psychosis not due to a substance or known physiological condition: Secondary | ICD-10-CM | POA: Diagnosis present

## 2013-04-25 DIAGNOSIS — Z9849 Cataract extraction status, unspecified eye: Secondary | ICD-10-CM

## 2013-04-25 DIAGNOSIS — E8809 Other disorders of plasma-protein metabolism, not elsewhere classified: Secondary | ICD-10-CM | POA: Diagnosis present

## 2013-04-25 DIAGNOSIS — G9340 Encephalopathy, unspecified: Secondary | ICD-10-CM | POA: Diagnosis present

## 2013-04-25 DIAGNOSIS — S02401A Maxillary fracture, unspecified, initial encounter for closed fracture: Secondary | ICD-10-CM | POA: Diagnosis present

## 2013-04-25 DIAGNOSIS — I5033 Acute on chronic diastolic (congestive) heart failure: Secondary | ICD-10-CM | POA: Diagnosis not present

## 2013-04-25 DIAGNOSIS — S0230XA Fracture of orbital floor, unspecified side, initial encounter for closed fracture: Secondary | ICD-10-CM | POA: Diagnosis present

## 2013-04-25 DIAGNOSIS — E785 Hyperlipidemia, unspecified: Secondary | ICD-10-CM

## 2013-04-25 DIAGNOSIS — D500 Iron deficiency anemia secondary to blood loss (chronic): Secondary | ICD-10-CM | POA: Diagnosis present

## 2013-04-25 DIAGNOSIS — Z8249 Family history of ischemic heart disease and other diseases of the circulatory system: Secondary | ICD-10-CM

## 2013-04-25 DIAGNOSIS — N400 Enlarged prostate without lower urinary tract symptoms: Secondary | ICD-10-CM

## 2013-04-25 DIAGNOSIS — K5732 Diverticulitis of large intestine without perforation or abscess without bleeding: Secondary | ICD-10-CM

## 2013-04-25 DIAGNOSIS — I2510 Atherosclerotic heart disease of native coronary artery without angina pectoris: Secondary | ICD-10-CM | POA: Diagnosis present

## 2013-04-25 DIAGNOSIS — I4510 Unspecified right bundle-branch block: Secondary | ICD-10-CM | POA: Diagnosis present

## 2013-04-25 DIAGNOSIS — K5731 Diverticulosis of large intestine without perforation or abscess with bleeding: Secondary | ICD-10-CM | POA: Diagnosis present

## 2013-04-25 DIAGNOSIS — R55 Syncope and collapse: Secondary | ICD-10-CM | POA: Diagnosis present

## 2013-04-25 DIAGNOSIS — Z794 Long term (current) use of insulin: Secondary | ICD-10-CM

## 2013-04-25 DIAGNOSIS — M1610 Unilateral primary osteoarthritis, unspecified hip: Secondary | ICD-10-CM | POA: Diagnosis present

## 2013-04-25 DIAGNOSIS — D649 Anemia, unspecified: Secondary | ICD-10-CM

## 2013-04-25 DIAGNOSIS — E559 Vitamin D deficiency, unspecified: Secondary | ICD-10-CM

## 2013-04-25 DIAGNOSIS — S51009A Unspecified open wound of unspecified elbow, initial encounter: Secondary | ICD-10-CM | POA: Diagnosis present

## 2013-04-25 DIAGNOSIS — Z820 Family history of epilepsy and other diseases of the nervous system: Secondary | ICD-10-CM

## 2013-04-25 DIAGNOSIS — I214 Non-ST elevation (NSTEMI) myocardial infarction: Secondary | ICD-10-CM

## 2013-04-25 DIAGNOSIS — S0280XA Fracture of other specified skull and facial bones, unspecified side, initial encounter for closed fracture: Secondary | ICD-10-CM

## 2013-04-25 DIAGNOSIS — K449 Diaphragmatic hernia without obstruction or gangrene: Secondary | ICD-10-CM | POA: Diagnosis present

## 2013-04-25 DIAGNOSIS — R7989 Other specified abnormal findings of blood chemistry: Secondary | ICD-10-CM

## 2013-04-25 DIAGNOSIS — R413 Other amnesia: Secondary | ICD-10-CM | POA: Diagnosis present

## 2013-04-25 DIAGNOSIS — E119 Type 2 diabetes mellitus without complications: Secondary | ICD-10-CM | POA: Diagnosis present

## 2013-04-25 DIAGNOSIS — K649 Unspecified hemorrhoids: Secondary | ICD-10-CM | POA: Diagnosis present

## 2013-04-25 DIAGNOSIS — I509 Heart failure, unspecified: Secondary | ICD-10-CM | POA: Diagnosis present

## 2013-04-25 DIAGNOSIS — R4181 Age-related cognitive decline: Secondary | ICD-10-CM | POA: Diagnosis present

## 2013-04-25 DIAGNOSIS — E876 Hypokalemia: Secondary | ICD-10-CM

## 2013-04-25 DIAGNOSIS — K5733 Diverticulitis of large intestine without perforation or abscess with bleeding: Principal | ICD-10-CM | POA: Diagnosis present

## 2013-04-25 DIAGNOSIS — Z85828 Personal history of other malignant neoplasm of skin: Secondary | ICD-10-CM

## 2013-04-25 DIAGNOSIS — I10 Essential (primary) hypertension: Secondary | ICD-10-CM

## 2013-04-25 DIAGNOSIS — K5900 Constipation, unspecified: Secondary | ICD-10-CM | POA: Diagnosis not present

## 2013-04-25 DIAGNOSIS — R7303 Prediabetes: Secondary | ICD-10-CM

## 2013-04-25 DIAGNOSIS — E872 Acidosis, unspecified: Secondary | ICD-10-CM | POA: Diagnosis not present

## 2013-04-25 DIAGNOSIS — M1990 Unspecified osteoarthritis, unspecified site: Secondary | ICD-10-CM

## 2013-04-25 DIAGNOSIS — S0180XA Unspecified open wound of other part of head, initial encounter: Secondary | ICD-10-CM | POA: Diagnosis present

## 2013-04-25 DIAGNOSIS — D62 Acute posthemorrhagic anemia: Secondary | ICD-10-CM

## 2013-04-25 DIAGNOSIS — M25551 Pain in right hip: Secondary | ICD-10-CM

## 2013-04-25 DIAGNOSIS — R578 Other shock: Secondary | ICD-10-CM

## 2013-04-25 DIAGNOSIS — D696 Thrombocytopenia, unspecified: Secondary | ICD-10-CM | POA: Diagnosis not present

## 2013-04-25 DIAGNOSIS — S02400A Malar fracture unspecified, initial encounter for closed fracture: Secondary | ICD-10-CM | POA: Diagnosis present

## 2013-04-25 DIAGNOSIS — I5030 Unspecified diastolic (congestive) heart failure: Secondary | ICD-10-CM

## 2013-04-25 DIAGNOSIS — I5032 Chronic diastolic (congestive) heart failure: Secondary | ICD-10-CM | POA: Diagnosis present

## 2013-04-25 DIAGNOSIS — W19XXXA Unspecified fall, initial encounter: Secondary | ICD-10-CM | POA: Diagnosis present

## 2013-04-25 DIAGNOSIS — S0181XA Laceration without foreign body of other part of head, initial encounter: Secondary | ICD-10-CM

## 2013-04-25 DIAGNOSIS — R197 Diarrhea, unspecified: Secondary | ICD-10-CM | POA: Diagnosis present

## 2013-04-25 DIAGNOSIS — R778 Other specified abnormalities of plasma proteins: Secondary | ICD-10-CM

## 2013-04-25 DIAGNOSIS — M549 Dorsalgia, unspecified: Secondary | ICD-10-CM | POA: Diagnosis not present

## 2013-04-25 DIAGNOSIS — Z8551 Personal history of malignant neoplasm of bladder: Secondary | ICD-10-CM

## 2013-04-25 DIAGNOSIS — K922 Gastrointestinal hemorrhage, unspecified: Secondary | ICD-10-CM

## 2013-04-25 DIAGNOSIS — S0285XA Fracture of orbit, unspecified, initial encounter for closed fracture: Secondary | ICD-10-CM

## 2013-04-25 DIAGNOSIS — Z79899 Other long term (current) drug therapy: Secondary | ICD-10-CM

## 2013-04-25 DIAGNOSIS — R933 Abnormal findings on diagnostic imaging of other parts of digestive tract: Secondary | ICD-10-CM | POA: Diagnosis present

## 2013-04-25 HISTORY — DX: Unspecified malignant neoplasm of skin, unspecified: C44.90

## 2013-04-25 LAB — CBC WITH DIFFERENTIAL/PLATELET
BASOS PCT: 0 % (ref 0–1)
Basophils Absolute: 0 10*3/uL (ref 0.0–0.1)
EOS ABS: 0 10*3/uL (ref 0.0–0.7)
EOS PCT: 0 % (ref 0–5)
HEMATOCRIT: 28.8 % — AB (ref 39.0–52.0)
Hemoglobin: 10 g/dL — ABNORMAL LOW (ref 13.0–17.0)
LYMPHS ABS: 0.3 10*3/uL — AB (ref 0.7–4.0)
Lymphocytes Relative: 3 % — ABNORMAL LOW (ref 12–46)
MCH: 34.4 pg — AB (ref 26.0–34.0)
MCHC: 34.7 g/dL (ref 30.0–36.0)
MCV: 99 fL (ref 78.0–100.0)
MONOS PCT: 5 % (ref 3–12)
Monocytes Absolute: 0.5 10*3/uL (ref 0.1–1.0)
NEUTROS PCT: 91 % — AB (ref 43–77)
Neutro Abs: 9.2 10*3/uL — ABNORMAL HIGH (ref 1.7–7.7)
PLATELETS: 178 10*3/uL (ref 150–400)
RBC: 2.91 MIL/uL — ABNORMAL LOW (ref 4.22–5.81)
RDW: 13.4 % (ref 11.5–15.5)
WBC: 10.1 10*3/uL (ref 4.0–10.5)

## 2013-04-25 LAB — BASIC METABOLIC PANEL
BUN: 38 mg/dL — ABNORMAL HIGH (ref 6–23)
CO2: 21 meq/L (ref 19–32)
Calcium: 9.3 mg/dL (ref 8.4–10.5)
Chloride: 108 mEq/L (ref 96–112)
Creatinine, Ser: 0.8 mg/dL (ref 0.50–1.35)
GFR calc Af Amer: 90 mL/min — ABNORMAL LOW (ref >90)
GFR, EST NON AFRICAN AMERICAN: 77 mL/min — AB (ref >90)
GLUCOSE: 175 mg/dL — AB (ref 70–99)
POTASSIUM: 4.7 meq/L (ref 3.7–5.3)
Sodium: 143 mEq/L (ref 137–147)

## 2013-04-25 LAB — GLUCOSE, CAPILLARY: GLUCOSE-CAPILLARY: 143 mg/dL — AB (ref 70–99)

## 2013-04-25 MED ORDER — SODIUM CHLORIDE 0.9 % IV SOLN
INTRAVENOUS | Status: AC
  Administered 2013-04-25 – 2013-04-26 (×2): via INTRAVENOUS

## 2013-04-25 MED ORDER — AMLODIPINE BESYLATE 5 MG PO TABS
5.0000 mg | ORAL_TABLET | Freq: Every day | ORAL | Status: DC
  Filled 2013-04-25: qty 1

## 2013-04-25 MED ORDER — ACETAMINOPHEN 325 MG PO TABS
650.0000 mg | ORAL_TABLET | ORAL | Status: DC | PRN
  Administered 2013-04-28 – 2013-05-05 (×13): 650 mg via ORAL
  Filled 2013-04-25 (×15): qty 2

## 2013-04-25 MED ORDER — TETANUS-DIPHTH-ACELL PERTUSSIS 5-2.5-18.5 LF-MCG/0.5 IM SUSP
0.5000 mL | Freq: Once | INTRAMUSCULAR | Status: AC
  Administered 2013-04-25: 0.5 mL via INTRAMUSCULAR
  Filled 2013-04-25: qty 0.5

## 2013-04-25 MED ORDER — CHOLECALCIFEROL 10 MCG (400 UNIT) PO TABS
400.0000 [IU] | ORAL_TABLET | Freq: Every day | ORAL | Status: DC
  Administered 2013-04-26 – 2013-05-05 (×9): 400 [IU] via ORAL
  Filled 2013-04-25 (×10): qty 1

## 2013-04-25 MED ORDER — INSULIN ASPART 100 UNIT/ML ~~LOC~~ SOLN
0.0000 [IU] | Freq: Three times a day (TID) | SUBCUTANEOUS | Status: DC
  Administered 2013-04-26 (×3): 1 [IU] via SUBCUTANEOUS

## 2013-04-25 MED ORDER — GEMFIBROZIL 600 MG PO TABS
600.0000 mg | ORAL_TABLET | Freq: Two times a day (BID) | ORAL | Status: DC
  Administered 2013-04-26 (×2): 600 mg via ORAL
  Filled 2013-04-25 (×5): qty 1

## 2013-04-25 MED ORDER — BENAZEPRIL HCL 40 MG PO TABS
40.0000 mg | ORAL_TABLET | Freq: Every day | ORAL | Status: DC
  Filled 2013-04-25: qty 1

## 2013-04-25 MED ORDER — TERAZOSIN HCL 5 MG PO CAPS
10.0000 mg | ORAL_CAPSULE | Freq: Two times a day (BID) | ORAL | Status: DC
  Administered 2013-04-25: 10 mg via ORAL
  Filled 2013-04-25 (×4): qty 2

## 2013-04-25 NOTE — ED Notes (Signed)
Attempt to call report. Floor RN will call back 

## 2013-04-25 NOTE — Consult Note (Signed)
Reason for Consult: Left orbital fractures Referring Physician: Riki Altes, MD  HPI:  Philip Simpson is an 80 y.o. male who apparently had a fall yesterday.  The pt could not recall the actual event.  His son brought him in today for evaluation of his left periorbital pain and laceration. In the ER, patient's CT shows nondisplaced left periorbital fractures. EKG shows sinus rhythm with RBBB and ST depression in the lateral leads and T wave inversion in the inferior leads.  Patient's labs also shows anemia.  Patient's son states that he has hemorrhoids and it bleeds very often.  Patient at this time denies any chest pain, shortness of breath.  He does not have any focal deficits, denies any headache, visual symptoms, difficulty speaking, swallowing, or any incontinence of urine or bowels.    Past Medical History  Diagnosis Date  . Hyperlipidemia   . Hypertension   . Arthritis   . Bladder cancer 2003  . Prediabetes   . Vitamin D deficiency   . BPH (benign prostatic hyperplasia)     Past Surgical History  Procedure Laterality Date  . Cysto  2003  . Tonsillectomy    . Cataract extraction Right     Family History  Problem Relation Age of Onset  . Alzheimer's disease Mother   . Heart disease Mother   . Heart disease Father     Social History:  reports that he has never smoked. He has never used smokeless tobacco. He reports that he does not drink alcohol or use illicit drugs.  Allergies:  Allergies  Allergen Reactions  . Baycol [Cerivastatin]   . Zocor [Simvastatin]     Medications:  I have reviewed the patient's current medications. Scheduled: . [START ON 04/26/2013] amLODipine  5 mg Oral Daily  . [START ON 04/26/2013] benazepril  40 mg Oral Daily  . [START ON 04/26/2013] cholecalciferol  400 Units Oral Daily  . [START ON 04/26/2013] gemfibrozil  600 mg Oral BID AC  . [START ON 04/26/2013] insulin aspart  0-9 Units Subcutaneous TID WC  . terazosin  10 mg Oral BID    BBC:[id number]  Results for orders placed during the hospital encounter of 04/25/13 (from the past 48 hour(s))  CBC WITH DIFFERENTIAL     Status: Abnormal   Collection Time    04/25/13  6:23 PM      Result Value Ref Range   WBC 10.1  4.0 - 10.5 K/uL   RBC 2.91 (*) 4.22 - 5.81 MIL/uL   Hemoglobin 10.0 (*) 13.0 - 17.0 g/dL   HCT 28.8 (*) 39.0 - 52.0 %   MCV 99.0  78.0 - 100.0 fL   MCH 34.4 (*) 26.0 - 34.0 pg   MCHC 34.7  30.0 - 36.0 g/dL   RDW 13.4  11.5 - 15.5 %   Platelets 178  150 - 400 K/uL   Neutrophils Relative % 91 (*) 43 - 77 %   Neutro Abs 9.2 (*) 1.7 - 7.7 K/uL   Lymphocytes Relative 3 (*) 12 - 46 %   Lymphs Abs 0.3 (*) 0.7 - 4.0 K/uL   Monocytes Relative 5  3 - 12 %   Monocytes Absolute 0.5  0.1 - 1.0 K/uL   Eosinophils Relative 0  0 - 5 %   Eosinophils Absolute 0.0  0.0 - 0.7 K/uL   Basophils Relative 0  0 - 1 %   Basophils Absolute 0.0  0.0 - 0.1 K/uL  BASIC METABOLIC PANEL  Status: Abnormal   Collection Time    04/25/13  6:23 PM      Result Value Ref Range   Sodium 143  137 - 147 mEq/L   Potassium 4.7  3.7 - 5.3 mEq/L   Chloride 108  96 - 112 mEq/L   CO2 21  19 - 32 mEq/L   Glucose, Bld 175 (*) 70 - 99 mg/dL   BUN 38 (*) 6 - 23 mg/dL   Creatinine, Ser 0.80  0.50 - 1.35 mg/dL   Calcium 9.3  8.4 - 10.5 mg/dL   GFR calc non Af Amer 77 (*) >90 mL/min   GFR calc Af Amer 90 (*) >90 mL/min   Comment: (NOTE)     The eGFR has been calculated using the CKD EPI equation.     This calculation has not been validated in all clinical situations.     eGFR's persistently <90 mL/min signify possible Chronic Kidney     Disease.    Ct Head Wo Contrast  04/25/2013   CLINICAL DATA:  Fall.  Bruising to left orbit.  EXAM: CT HEAD WITHOUT CONTRAST  CT MAXILLOFACIAL WITHOUT CONTRAST  TECHNIQUE: Multidetector CT imaging of the head and maxillofacial structures were performed using the standard protocol without intravenous contrast. Multiplanar CT image reconstructions of the  maxillofacial structures were also generated.  COMPARISON:  None.  FINDINGS: CT HEAD FINDINGS  Diffuse, extensive cerebral atrophy. Dilated extra-axial CSF spaces over the cerebral hemispheres bilaterally most likely related to the volume loss, or this could be related to chronic subdural hygromas. No hemorrhage. No mass lesion or acute infarction. No hydrocephalus.  No acute calvarial abnormality. Left orbital fractures noted. See maxillofacial report below.  CT MAXILLOFACIAL FINDINGS  Fracture through the left lateral orbital wall and orbital floor. Probable nondisplaced zygomatic arch fracture on the left. Fracture also noted posteriorly within the lateral posterior orbital wall on image 68 of series 6. This is also noted on image 71 of series 6. The fracture line continues laterally into the sphenoid wing and superiorly into the squamosal portion of the left temporal bone.  Small amount of fluid noted in the left maxillary sinus. Remainder the paranasal sinuses are clear. Mastoids are clear. Globes are intact.  IMPRESSION: Diffuse cerebral atrophy. Prominent extra-axial low-density fluid likely related to the atrophy although these could represent chronic subdural hygromas.  No acute intracranial abnormality.  Complex left orbital fracture including anterior and posterior lateral walls, extending laterally into the sphenoid wing, and likely into the squamosal portion of the temporal bone. Left orbital floor fracture. Nondisplaced zygomatic arch fracture.   Electronically Signed   By: Rolm Baptise M.D.   On: 04/25/2013 17:32   Ct Maxillofacial Wo Cm  04/25/2013   CLINICAL DATA:  Fall.  Bruising to left orbit.  EXAM: CT HEAD WITHOUT CONTRAST  CT MAXILLOFACIAL WITHOUT CONTRAST  TECHNIQUE: Multidetector CT imaging of the head and maxillofacial structures were performed using the standard protocol without intravenous contrast. Multiplanar CT image reconstructions of the maxillofacial structures were also generated.   COMPARISON:  None.  FINDINGS: CT HEAD FINDINGS  Diffuse, extensive cerebral atrophy. Dilated extra-axial CSF spaces over the cerebral hemispheres bilaterally most likely related to the volume loss, or this could be related to chronic subdural hygromas. No hemorrhage. No mass lesion or acute infarction. No hydrocephalus.  No acute calvarial abnormality. Left orbital fractures noted. See maxillofacial report below.  CT MAXILLOFACIAL FINDINGS  Fracture through the left lateral orbital wall and orbital floor. Probable  nondisplaced zygomatic arch fracture on the left. Fracture also noted posteriorly within the lateral posterior orbital wall on image 68 of series 6. This is also noted on image 71 of series 6. The fracture line continues laterally into the sphenoid wing and superiorly into the squamosal portion of the left temporal bone.  Small amount of fluid noted in the left maxillary sinus. Remainder the paranasal sinuses are clear. Mastoids are clear. Globes are intact.  IMPRESSION: Diffuse cerebral atrophy. Prominent extra-axial low-density fluid likely related to the atrophy although these could represent chronic subdural hygromas.  No acute intracranial abnormality.  Complex left orbital fracture including anterior and posterior lateral walls, extending laterally into the sphenoid wing, and likely into the squamosal portion of the temporal bone. Left orbital floor fracture. Nondisplaced zygomatic arch fracture.   Electronically Signed   By: Rolm Baptise M.D.   On: 04/25/2013 17:32   Blood pressure 133/52, pulse 98, temperature 97.7 F (36.5 C), temperature source Oral, resp. rate 16, height 5' 7"  (1.702 m), weight 169 lb 1.6 oz (76.703 kg), SpO2 100.00%.  Physical Exam  Nursing note and vitals reviewed.  Constitutional: Awake, mildly confused, nontoxic appearance with baseline speech for patient.  Ears: Normal auricle. Right TM wnl. Left EAC with cerumen impaction. Mouth/Throat: No oropharyngeal exudate.   Left periorbital mild tenderness with ecchymosis. 2 cm irregular laceration lateral to his left periorbital region, closed with dermabond. Eyes: EOM are normal. Pupils are equal, round, and reactive to light. No entrapment. No diplopia. Neck: Neck supple. Cervical spine nontender  Cardiovascular: Normal rate and regular rhythm.  Lymphadenopathy: He has no cervical adenopathy.  Neurological: He is alert and responsive, but slightly confused. CN 2-12 all intact. Skin: No rash noted.  Psychiatric: He has a normal mood and affect.   Assessment/Plan: Non-displaced left orbital fracture. No entrapment, diplopia, or visual loss.  Likely will not need any surgical intervention.  Pt may f/u with me as an outpatient approximately 1 week after discharge.  Pt may call [phone] to schedule an appt.  Cheyan Frees,SUI W 04/25/2013, 11:01 PM

## 2013-04-25 NOTE — ED Notes (Signed)
Pt reports he fell late last night while walking to bathroom. sts He was unable to get up for a long time. Son went to check on him this afternoon and brought him to the ER. Pt has bruising to L eye and wound noted to lateral L eye. Pt denies loc. Family reports that pt seem a little "grogy and slow".

## 2013-04-25 NOTE — ED Notes (Signed)
Pt to CT

## 2013-04-25 NOTE — ED Provider Notes (Signed)
CSN: [id number]     Arrival date & time 04/25/13  1616 History   First MD Initiated Contact with Patient 04/25/13 1733     Chief Complaint  Patient presents with  . Fall     (Consider location/radiation/quality/duration/timing/severity/associated sxs/prior Treatment) HPI 80 year old male lives alone still drives has amnesia for the event sometime last night apparently woke up to go the bathroom at about 2 in the morning next thing he remembers he woke up on the floor at his face with some mild left periorbital pain and bruising but no headache no neck pain no chest pain no palpitations no shortness breath no abdominal pain no back pain no injury to his arms or legs no focal or lateralizing weakness or numbness or incoordination he was able to get up and walk to the couch. His family checked on him today realized he had fallen so brought him to the emergency room for evaluation. His left periorbital pain and bruising is mild without treatment prior to arrival. He also has a small laceration lateral to his left orbital region. He is no change in speech vision swallowing or understanding. His last tetanus shot is unknown so will receive a tetanus booster today in the emergency department. Past Medical History  Diagnosis Date  . Hyperlipidemia   . Hypertension   . Arthritis   . Bladder cancer 2003  . Prediabetes   . Vitamin D deficiency   . BPH (benign prostatic hyperplasia)    Past Surgical History  Procedure Laterality Date  . Cysto  2003  . Tonsillectomy    . Cataract extraction Right    Family History  Problem Relation Age of Onset  . Alzheimer's disease Mother   . Heart disease Mother   . Heart disease Father    History  Substance Use Topics  . Smoking status: Never Smoker   . Smokeless tobacco: Never Used  . Alcohol Use: No    Review of Systems  10 Systems reviewed and are negative for acute change except as noted in the HPI.  Allergies  Baycol and Zocor  Home  Medications   No current outpatient prescriptions on file. BP 133/52  Pulse 98  Temp(Src) 97.7 F (36.5 C) (Oral)  Resp 16  Ht 5\' 7"  (1.702 m)  Wt 169 lb 1.6 oz (76.703 kg)  BMI 26.48 kg/m2  SpO2 100% Physical Exam  Nursing note and vitals reviewed. Constitutional:  Awake, alert, nontoxic appearance with baseline speech for patient.  HENT:  Mouth/Throat: No oropharyngeal exudate.  Left periorbital mild tenderness with ecchymosis 2 cm irregular laceration lateral to his left periorbital region with no foreign body noted no significant deep structure involvement noted  Eyes: EOM are normal. Pupils are equal, round, and reactive to light. Right eye exhibits no discharge. Left eye exhibits no discharge.  Neck: Neck supple.  Cervical spine nontender  Cardiovascular: Normal rate and regular rhythm.   No murmur heard. Pulmonary/Chest: Effort normal and breath sounds normal. No stridor. No respiratory distress. He has no wheezes. He has no rales. He exhibits no tenderness.  Abdominal: Soft. Bowel sounds are normal. He exhibits no mass. There is no tenderness. There is no rebound.  Musculoskeletal: He exhibits no edema and no tenderness.  Baseline ROM, moves extremities with no obvious new focal weakness.  Lymphadenopathy:    He has no cervical adenopathy.  Neurological: He is alert.  Awake, alert, cooperative and aware of situation; motor strength 5/5 bilaterally; sensation normal to light touch bilaterally; peripheral  visual fields full to confrontation; no facial asymmetry; tongue midline; major cranial nerves appear intact; no pronator drift, normal finger to nose bilaterally  Skin: No rash noted.  Psychiatric: He has a normal mood and affect.    ED Course  Procedures (including critical care time) D/w [place] MaxFacial trauma will consult tomorrow. [place] in ED never received iStat Chem8; d/w main lab who has blood and will run BMP now. 2015 Pt stable in ED with no significant  deterioration in condition. Patient / Family / Caregiver informed of clinical course, understand medical decision-making process, and agree with plan.d/w Med for Obs for possible non-low risk syncope.  LACERATION REPAIR Performed by: Babette Relic Consent: Verbal consent obtained. Risks and benefits: risks, benefits and alternatives were discussed Patient identity confirmed: provided demographic data Time out performed prior to procedure Prepped and Draped in normal sterile fashion Wound explored Laceration Location:  Left face Laceration Length: 2cm No Foreign Bodies seen or palpated Amount of cleaning: standard Safclens Skin closure: Dermabond Technique: tissue glue Patient tolerance: Patient tolerated the procedure well with no immediate complications. Labs Review Labs Reviewed  CBC WITH DIFFERENTIAL - Abnormal; Notable for the following:    RBC 2.91 (*)    Hemoglobin 10.0 (*)    HCT 28.8 (*)    MCH 34.4 (*)    Neutrophils Relative % 91 (*)    Neutro Abs 9.2 (*)    Lymphocytes Relative 3 (*)    Lymphs Abs 0.3 (*)    All other components within normal limits  BASIC METABOLIC PANEL - Abnormal; Notable for the following:    Glucose, Bld 175 (*)    BUN 38 (*)    GFR calc non Af Amer 77 (*)    GFR calc Af Amer 90 (*)    All other components within normal limits  CBC WITH DIFFERENTIAL - Abnormal; Notable for the following:    RBC 2.43 (*)    Hemoglobin 8.4 (*)    HCT 24.0 (*)    MCH 34.6 (*)    Neutrophils Relative % 88 (*)    Lymphocytes Relative 6 (*)    Lymphs Abs 0.4 (*)    All other components within normal limits  RETICULOCYTES - Abnormal; Notable for the following:    RBC. 2.40 (*)    All other components within normal limits  GLUCOSE, CAPILLARY - Abnormal; Notable for the following:    Glucose-Capillary 143 (*)    All other components within normal limits  COMPREHENSIVE METABOLIC PANEL  TROPONIN I  VITAMIN B12  FOLATE  IRON AND TIBC  FERRITIN  I-STAT CHEM  8, ED   Imaging Review Ct Head Wo Contrast  04/25/2013   CLINICAL DATA:  Fall.  Bruising to left orbit.  EXAM: CT HEAD WITHOUT CONTRAST  CT MAXILLOFACIAL WITHOUT CONTRAST  TECHNIQUE: Multidetector CT imaging of the head and maxillofacial structures were performed using the standard protocol without intravenous contrast. Multiplanar CT image reconstructions of the maxillofacial structures were also generated.  COMPARISON:  None.  FINDINGS: CT HEAD FINDINGS  Diffuse, extensive cerebral atrophy. Dilated extra-axial CSF spaces over the cerebral hemispheres bilaterally most likely related to the volume loss, or this could be related to chronic subdural hygromas. No hemorrhage. No mass lesion or acute infarction. No hydrocephalus.  No acute calvarial abnormality. Left orbital fractures noted. See maxillofacial report below.  CT MAXILLOFACIAL FINDINGS  Fracture through the left lateral orbital wall and orbital floor. Probable nondisplaced zygomatic arch fracture on the left. Fracture  also noted posteriorly within the lateral posterior orbital wall on image 68 of series 6. This is also noted on image 71 of series 6. The fracture line continues laterally into the sphenoid wing and superiorly into the squamosal portion of the left temporal bone.  Small amount of fluid noted in the left maxillary sinus. Remainder the paranasal sinuses are clear. Mastoids are clear. Globes are intact.  IMPRESSION: Diffuse cerebral atrophy. Prominent extra-axial low-density fluid likely related to the atrophy although these could represent chronic subdural hygromas.  No acute intracranial abnormality.  Complex left orbital fracture including anterior and posterior lateral walls, extending laterally into the sphenoid wing, and likely into the squamosal portion of the temporal bone. Left orbital floor fracture. Nondisplaced zygomatic arch fracture.   Electronically Signed   By: Rolm Baptise M.D.   On: 04/25/2013 17:32   Ct Maxillofacial Wo  Cm  04/25/2013   CLINICAL DATA:  Fall.  Bruising to left orbit.  EXAM: CT HEAD WITHOUT CONTRAST  CT MAXILLOFACIAL WITHOUT CONTRAST  TECHNIQUE: Multidetector CT imaging of the head and maxillofacial structures were performed using the standard protocol without intravenous contrast. Multiplanar CT image reconstructions of the maxillofacial structures were also generated.  COMPARISON:  None.  FINDINGS: CT HEAD FINDINGS  Diffuse, extensive cerebral atrophy. Dilated extra-axial CSF spaces over the cerebral hemispheres bilaterally most likely related to the volume loss, or this could be related to chronic subdural hygromas. No hemorrhage. No mass lesion or acute infarction. No hydrocephalus.  No acute calvarial abnormality. Left orbital fractures noted. See maxillofacial report below.  CT MAXILLOFACIAL FINDINGS  Fracture through the left lateral orbital wall and orbital floor. Probable nondisplaced zygomatic arch fracture on the left. Fracture also noted posteriorly within the lateral posterior orbital wall on image 68 of series 6. This is also noted on image 71 of series 6. The fracture line continues laterally into the sphenoid wing and superiorly into the squamosal portion of the left temporal bone.  Small amount of fluid noted in the left maxillary sinus. Remainder the paranasal sinuses are clear. Mastoids are clear. Globes are intact.  IMPRESSION: Diffuse cerebral atrophy. Prominent extra-axial low-density fluid likely related to the atrophy although these could represent chronic subdural hygromas.  No acute intracranial abnormality.  Complex left orbital fracture including anterior and posterior lateral walls, extending laterally into the sphenoid wing, and likely into the squamosal portion of the temporal bone. Left orbital floor fracture. Nondisplaced zygomatic arch fracture.   Electronically Signed   By: Rolm Baptise M.D.   On: 04/25/2013 17:32     EKG Interpretation   Date/Time:  Sunday April 25 2013  16:55:51 EDT Ventricular Rate:  89 PR Interval:  176 QRS Duration: 137 QT Interval:  355 QTC Calculation: 432 R Axis:   67 Text Interpretation:  Sinus rhythm Right bundle branch block Repol abnrm  suggests ischemia, diffuse leads No significant change since last tracing  Confirmed by [employer]  MD, Jenny Reichmann ([id number]) on 04/25/2013 8:11:17 PM      MDM   Final diagnoses:  Syncope  Orbital fracture  Facial laceration    The patient appears reasonably stabilized for admission considering the current resources, flow, and capabilities available in the ED at this time, and I doubt any other [facility] requiring further screening and/or treatment in the ED prior to admission.    Babette Relic, MD 04/26/13 [phone]

## 2013-04-25 NOTE — H&P (Addendum)
[facility] History and Physical  Philip Simpson VOJ:[id number] DOB: 08-13-1922 DOA: 04/25/2013  Referring physician: ER physician. PCP: Alesia Richards, MD   Chief Complaint: Loss of consciousness.  HPI: Philip Simpson is a 80 y.o. male history of hypertension hyperlipidemia prediabetes presents to the ER because of loss of consciousness. Patient early in the morning had episode of loss of consciousness which he is not recall it is completely amnestic to the episode. Since patient was not seen around his house by his neighbors, the neighbors called his son had it when his son came to check on his dad he was found to be sitting on the commode with a puddle of blood around him. Patient had some laceration on his left forehead and PR rectal area and left elbow area. Patient has had previous episodes of syncope many years ago when he used it muscle spasms in his low back. In the ER patient's CT head maxillofacial shows left periorbital fracture for which ENT surgeon Dr. Arnette Norris has been consulted. EKG shows sinus rhythm with RBBB and ST depression in the lateral leads and T wave inversion in the inferior leads. No old EKG to compare. Patient's labs also shows anemia at patient's son states that he has hemorrhoids and it bleeds very often patient is requesting no colonoscopy or further workup on that. Patient at this time denies any chest pain shortness of breath does not have any focal deficits denies any headache visual symptoms difficulty speaking swallowing or any incontinence of urine or bowels.   Review of Systems: As presented in the history of presenting illness, rest negative.  Past Medical History  Diagnosis Date  . Hyperlipidemia   . Hypertension   . Arthritis   . Bladder cancer 2003  . Prediabetes   . Vitamin D deficiency   . BPH (benign prostatic hyperplasia)    Past Surgical History  Procedure Laterality Date  . Cysto  2003  . Tonsillectomy    . Cataract extraction Right     Social History:  reports that he has never smoked. He has never used smokeless tobacco. He reports that he does not drink alcohol or use illicit drugs. Where does patient live home. Can patient participate in ADLs? Yes.  Allergies  Allergen Reactions  . Baycol [Cerivastatin]   . Zocor [Simvastatin]     Family History:  Family History  Problem Relation Age of Onset  . Alzheimer's disease Mother   . Heart disease Mother   . Heart disease Father       Prior to Admission medications   Medication Sig Start Date End Date Taking? Authorizing Provider  amLODipine (NORVASC) 5 MG tablet Take 5 mg by mouth daily.   Yes Historical Provider, MD  benazepril (LOTENSIN) 40 MG tablet Take 40 mg by mouth daily.   Yes Historical Provider, MD  Cholecalciferol (VITAMIN D PO) Take 2 tablets by mouth daily.   Yes Historical Provider, MD  gemfibrozil (LOPID) 600 MG tablet Take 600 mg by mouth 2 (two) times daily before a meal.   Yes Historical Provider, MD  oxymetazoline (AFRIN) 0.05 % nasal spray Place 1 spray into both nostrils 2 (two) times daily as needed for congestion.   Yes Historical Provider, MD  terazosin (HYTRIN) 10 MG capsule Take 10 mg by mouth 2 (two) times daily.   Yes Historical Provider, MD    Physical Exam: Filed Vitals:   04/25/13 1941 04/25/13 2015 04/25/13 2130 04/25/13 2143  BP: 124/55 125/51 109/56 109/56  Pulse: 89 89 99 94  Temp: 98.2 F (36.8 C)     TempSrc:      Resp: 17 14 19 14   SpO2: 99% 98% 98% 96%     General:  Well-developed well-nourished.  Eyes: Left periorbital hematoma with laceration which has been glued.  ENT: Left periorbital hematoma with laceration which has been glued. No discharge from the ears eyes nose mouth.  Neck: No mass felt no neck rigidity.  Cardiovascular: S1-S2 heard.  Respiratory: No rhonchi or crepitations.  Abdomen: Soft nontender bowel sounds present.  Skin: Laceration on his left periorbital area and left  elbow.  Musculoskeletal: No edema.  Psychiatric: Appears normal.  Neurologic: Alert awake oriented to time place and person. Moves all extremities 5 x 5. No facial asymmetry. Tongue is midline.  Labs on Admission:  Basic Metabolic Panel:  Recent Labs Lab 04/25/13 1823  NA 143  K 4.7  CL 108  CO2 21  GLUCOSE 175*  BUN 38*  CREATININE 0.80  CALCIUM 9.3   Liver Function Tests: No results found for this basename: AST, ALT, ALKPHOS, BILITOT, PROT, ALBUMIN,  in the last 168 hours No results found for this basename: LIPASE, AMYLASE,  in the last 168 hours No results found for this basename: AMMONIA,  in the last 168 hours CBC:  Recent Labs Lab 04/25/13 1823  WBC 10.1  NEUTROABS 9.2*  HGB 10.0*  HCT 28.8*  MCV 99.0  PLT 178   Cardiac Enzymes: No results found for this basename: CKTOTAL, CKMB, CKMBINDEX, TROPONINI,  in the last 168 hours  BNP (last 3 results) No results found for this basename: PROBNP,  in the last 8760 hours CBG: No results found for this basename: GLUCAP,  in the last 168 hours  Radiological Exams on Admission: Ct Head Wo Contrast  04/25/2013   CLINICAL DATA:  Fall.  Bruising to left orbit.  EXAM: CT HEAD WITHOUT CONTRAST  CT MAXILLOFACIAL WITHOUT CONTRAST  TECHNIQUE: Multidetector CT imaging of the head and maxillofacial structures were performed using the standard protocol without intravenous contrast. Multiplanar CT image reconstructions of the maxillofacial structures were also generated.  COMPARISON:  None.  FINDINGS: CT HEAD FINDINGS  Diffuse, extensive cerebral atrophy. Dilated extra-axial CSF spaces over the cerebral hemispheres bilaterally most likely related to the volume loss, or this could be related to chronic subdural hygromas. No hemorrhage. No mass lesion or acute infarction. No hydrocephalus.  No acute calvarial abnormality. Left orbital fractures noted. See maxillofacial report below.  CT MAXILLOFACIAL FINDINGS  Fracture through the left  lateral orbital wall and orbital floor. Probable nondisplaced zygomatic arch fracture on the left. Fracture also noted posteriorly within the lateral posterior orbital wall on image 68 of series 6. This is also noted on image 71 of series 6. The fracture line continues laterally into the sphenoid wing and superiorly into the squamosal portion of the left temporal bone.  Small amount of fluid noted in the left maxillary sinus. Remainder the paranasal sinuses are clear. Mastoids are clear. Globes are intact.  IMPRESSION: Diffuse cerebral atrophy. Prominent extra-axial low-density fluid likely related to the atrophy although these could represent chronic subdural hygromas.  No acute intracranial abnormality.  Complex left orbital fracture including anterior and posterior lateral walls, extending laterally into the sphenoid wing, and likely into the squamosal portion of the temporal bone. Left orbital floor fracture. Nondisplaced zygomatic arch fracture.   Electronically Signed   By: Rolm Baptise M.D.   On: 04/25/2013 17:32  Ct Maxillofacial Wo Cm  04/25/2013   CLINICAL DATA:  Fall.  Bruising to left orbit.  EXAM: CT HEAD WITHOUT CONTRAST  CT MAXILLOFACIAL WITHOUT CONTRAST  TECHNIQUE: Multidetector CT imaging of the head and maxillofacial structures were performed using the standard protocol without intravenous contrast. Multiplanar CT image reconstructions of the maxillofacial structures were also generated.  COMPARISON:  None.  FINDINGS: CT HEAD FINDINGS  Diffuse, extensive cerebral atrophy. Dilated extra-axial CSF spaces over the cerebral hemispheres bilaterally most likely related to the volume loss, or this could be related to chronic subdural hygromas. No hemorrhage. No mass lesion or acute infarction. No hydrocephalus.  No acute calvarial abnormality. Left orbital fractures noted. See maxillofacial report below.  CT MAXILLOFACIAL FINDINGS  Fracture through the left lateral orbital wall and orbital floor.  Probable nondisplaced zygomatic arch fracture on the left. Fracture also noted posteriorly within the lateral posterior orbital wall on image 68 of series 6. This is also noted on image 71 of series 6. The fracture line continues laterally into the sphenoid wing and superiorly into the squamosal portion of the left temporal bone.  Small amount of fluid noted in the left maxillary sinus. Remainder the paranasal sinuses are clear. Mastoids are clear. Globes are intact.  IMPRESSION: Diffuse cerebral atrophy. Prominent extra-axial low-density fluid likely related to the atrophy although these could represent chronic subdural hygromas.  No acute intracranial abnormality.  Complex left orbital fracture including anterior and posterior lateral walls, extending laterally into the sphenoid wing, and likely into the squamosal portion of the temporal bone. Left orbital floor fracture. Nondisplaced zygomatic arch fracture.   Electronically Signed   By: Rolm Baptise M.D.   On: 04/25/2013 17:32    EKG: Independently reviewed. Normal sinus rhythm with RBBB and ST depression in lateral leads and T-wave inversion in inferior leads.  Assessment/Plan Principal Problem:   Syncope Active Problems:   Hyperlipidemia   Hypertension   Prediabetes   Orbital fracture   Anemia   1. Syncope - cause not clear but likely cardiac. Given the EKG changes including ST depression we will cycle cardiac markers check 2D echo and closely monitor in telemetry to rule out any arrhythmias. Check MRI brain and EEG and orthostatic changes. Physical therapy consult. 2. Left orbital fracture - ENT surgeon to see. 3. Anemia -  Patient's son states that patient has active right hemorrhoids and patient does not pursue any further workup at that. Closely follow CBC. Check anemia panel.  4. Hypertension - continue present medications. 5. Hyperlipidemia - continue statins. 6. Prediabetes - I have placed patient on insulin sliding-scale.  I have  reviewed patient's old charts and labs.  Code Status: Full code.  Family Communication: Patient's son at the bedside.  Disposition Plan: Admit for observation.    Milaina Sher N. [facility] Pager [phone].  If 7PM-7AM, please contact night-coverage [email or web address] Password [facility] 04/25/2013, 10:18 PM

## 2013-04-25 NOTE — ED Notes (Signed)
HEENT md at bedside

## 2013-04-25 NOTE — ED Notes (Signed)
Returned from [place].  Pt placed back on cardiac monitor.  Family at bedside.

## 2013-04-26 ENCOUNTER — Observation Stay (HOSPITAL_COMMUNITY): Payer: Medicare PPO

## 2013-04-26 DIAGNOSIS — I059 Rheumatic mitral valve disease, unspecified: Secondary | ICD-10-CM

## 2013-04-26 DIAGNOSIS — R55 Syncope and collapse: Secondary | ICD-10-CM

## 2013-04-26 DIAGNOSIS — R799 Abnormal finding of blood chemistry, unspecified: Secondary | ICD-10-CM

## 2013-04-26 LAB — CK: Total CK: 853 U/L — ABNORMAL HIGH (ref 7–232)

## 2013-04-26 LAB — LIPID PANEL
CHOL/HDL RATIO: 2.7 ratio
Cholesterol: 102 mg/dL (ref 0–200)
HDL: 38 mg/dL — AB (ref >39)
LDL CALC: 50 mg/dL (ref 0–99)
TRIGLYCERIDES: 72 mg/dL (ref <150)
VLDL: 14 mg/dL (ref 0–40)

## 2013-04-26 LAB — COMPREHENSIVE METABOLIC PANEL
ALBUMIN: 2.7 g/dL — AB (ref 3.5–5.2)
ALK PHOS: 47 U/L (ref 39–117)
ALT: 10 U/L (ref 0–53)
AST: 18 U/L (ref 0–37)
BUN: 36 mg/dL — AB (ref 6–23)
CHLORIDE: 107 meq/L (ref 96–112)
CO2: 22 meq/L (ref 19–32)
Calcium: 8.6 mg/dL (ref 8.4–10.5)
Creatinine, Ser: 0.87 mg/dL (ref 0.50–1.35)
GFR calc Af Amer: 87 mL/min — ABNORMAL LOW (ref >90)
GFR, EST NON AFRICAN AMERICAN: 75 mL/min — AB (ref >90)
Glucose, Bld: 165 mg/dL — ABNORMAL HIGH (ref 70–99)
POTASSIUM: 3.8 meq/L (ref 3.7–5.3)
Sodium: 142 mEq/L (ref 137–147)
Total Bilirubin: 0.3 mg/dL (ref 0.3–1.2)
Total Protein: 5.1 g/dL — ABNORMAL LOW (ref 6.0–8.3)

## 2013-04-26 LAB — FOLATE
Folate: 8.4 ng/mL
Folate: 8.7 ng/mL

## 2013-04-26 LAB — CK TOTAL AND CKMB (NOT AT ARMC)
CK TOTAL: 619 U/L — AB (ref 7–232)
CK, MB: 16.4 ng/mL (ref 0.3–4.0)
CK, MB: 17.3 ng/mL (ref 0.3–4.0)
Relative Index: 3.3 — ABNORMAL HIGH (ref 0.0–2.5)
Relative Index: 2.8 — ABNORMAL HIGH (ref 0.0–2.5)
Total CK: 504 U/L — ABNORMAL HIGH (ref 7–232)

## 2013-04-26 LAB — CBC WITH DIFFERENTIAL/PLATELET
BASOS PCT: 0 % (ref 0–1)
Basophils Absolute: 0 10*3/uL (ref 0.0–0.1)
EOS ABS: 0 10*3/uL (ref 0.0–0.7)
EOS PCT: 0 % (ref 0–5)
HEMATOCRIT: 24 % — AB (ref 39.0–52.0)
Hemoglobin: 8.4 g/dL — ABNORMAL LOW (ref 13.0–17.0)
Lymphocytes Relative: 6 % — ABNORMAL LOW (ref 12–46)
Lymphs Abs: 0.4 10*3/uL — ABNORMAL LOW (ref 0.7–4.0)
MCH: 34.6 pg — AB (ref 26.0–34.0)
MCHC: 35 g/dL (ref 30.0–36.0)
MCV: 98.8 fL (ref 78.0–100.0)
MONO ABS: 0.4 10*3/uL (ref 0.1–1.0)
Monocytes Relative: 6 % (ref 3–12)
Neutro Abs: 6.1 10*3/uL (ref 1.7–7.7)
Neutrophils Relative %: 88 % — ABNORMAL HIGH (ref 43–77)
Platelets: 155 10*3/uL (ref 150–400)
RBC: 2.43 MIL/uL — ABNORMAL LOW (ref 4.22–5.81)
RDW: 13.5 % (ref 11.5–15.5)
WBC: 7 10*3/uL (ref 4.0–10.5)

## 2013-04-26 LAB — VITAMIN B12
VITAMIN B 12: 283 pg/mL (ref 211–911)
Vitamin B-12: 266 pg/mL (ref 211–911)

## 2013-04-26 LAB — IRON AND TIBC
Iron: 82 ug/dL (ref 42–135)
Saturation Ratios: 26 % (ref 20–55)
TIBC: 311 ug/dL (ref 215–435)
UIBC: 229 ug/dL (ref 125–400)

## 2013-04-26 LAB — GLUCOSE, CAPILLARY
GLUCOSE-CAPILLARY: 140 mg/dL — AB (ref 70–99)
GLUCOSE-CAPILLARY: 146 mg/dL — AB (ref 70–99)
Glucose-Capillary: 146 mg/dL — ABNORMAL HIGH (ref 70–99)
Glucose-Capillary: 131 mg/dL — ABNORMAL HIGH (ref 70–99)

## 2013-04-26 LAB — OCCULT BLOOD X 1 CARD TO LAB, STOOL: FECAL OCCULT BLD: POSITIVE — AB

## 2013-04-26 LAB — ABO/RH: ABO/RH(D): B NEG

## 2013-04-26 LAB — PREPARE RBC (CROSSMATCH)

## 2013-04-26 LAB — RETICULOCYTES
RBC.: 2.4 MIL/uL — AB (ref 4.22–5.81)
RBC.: 2.19 MIL/uL — AB (ref 4.22–5.81)
RETIC COUNT ABSOLUTE: 40.8 10*3/uL (ref 19.0–186.0)
RETIC COUNT ABSOLUTE: 48.2 10*3/uL (ref 19.0–186.0)
RETIC CT PCT: 1.7 % (ref 0.4–3.1)
Retic Ct Pct: 2.2 % (ref 0.4–3.1)

## 2013-04-26 LAB — FERRITIN
FERRITIN: 46 ng/mL (ref 22–322)
FERRITIN: 45 ng/mL (ref 22–322)

## 2013-04-26 LAB — HEMOGLOBIN AND HEMATOCRIT, BLOOD
HEMATOCRIT: 17.7 % — AB (ref 39.0–52.0)
Hemoglobin: 6.2 g/dL — CL (ref 13.0–17.0)

## 2013-04-26 LAB — TROPONIN I
Troponin I: 0.64 ng/mL (ref <0.30)
Troponin I: 1.55 ng/mL (ref <0.30)
Troponin I: 1.54 ng/mL (ref <0.30)
Troponin I: 2.79 ng/mL (ref <0.30)

## 2013-04-26 MED ORDER — ASPIRIN 325 MG PO TABS
325.0000 mg | ORAL_TABLET | Freq: Every day | ORAL | Status: DC
  Administered 2013-04-26: 325 mg via ORAL
  Filled 2013-04-26: qty 1

## 2013-04-26 MED ORDER — LORAZEPAM 2 MG/ML IJ SOLN
0.2500 mg | INTRAMUSCULAR | Status: AC | PRN
  Administered 2013-04-26 – 2013-04-27 (×2): 0.25 mg via INTRAVENOUS
  Filled 2013-04-26 (×2): qty 1

## 2013-04-26 MED ORDER — SODIUM CHLORIDE 0.9 % IV BOLUS (SEPSIS)
500.0000 mL | Freq: Once | INTRAVENOUS | Status: AC
  Administered 2013-04-26: 500 mL via INTRAVENOUS

## 2013-04-26 NOTE — Progress Notes (Signed)
Called Blood bank and lab/ Phlebotomy: type and screen not ready at this time. requested to be done ASAP. Will continue to monitor. Ferdinand Lango, RN

## 2013-04-26 NOTE — Progress Notes (Signed)
EEG completed; results pending.    

## 2013-04-26 NOTE — Progress Notes (Signed)
Pt had 3 episode of bloody stool , Md notified and gave order to transfuse 1 unit of blood. GI consult pending. Will continue to monitor pt.

## 2013-04-26 NOTE — Consult Note (Addendum)
Reason for Consult: Syncope + Elevated Troponin Referring Physician: TRH  NOTE: The patient is alert but not very communicative. He does not give responses when asked questions. Unfortunately no family was present during this encounter, thus the HPI is limited. The majority of the information below was obtained from chart review.   HPI: The patient is a 80 y/o male with no prior cardiac history, who was admitted to [facility] yesterday. The exact events are not clear. His PMH is significant tor HTN, HLD and prediabetes. He was apparently found by his son yesterday morning. He was found sitting near the commode with a puddle of blood around him. He apparently had a laceration on his left forehead. He also was noted to have blood around his rectal area and left elbow. Until today, the patient has been living alone at home. In the ER, patient's CT of the head demonstrated no hemorrhage. No mass lesion or acute infarction. He was however shown to have a left periorbital fracture for which ENT surgeon Dr. Arnette Norris has been consulted. MRI of the brain is pending. EKG showed sinus rhythm with RBBB and ST depression in the lateral leads and T wave inversion in the inferior leads (? repolarization abnormalities). No old EKG to compare. Patient's labs also shows anemia. Hgb today is at 8.4. Troponin's have also been elevated today x 3 at 0.64, 1.55 and 1.54.   Due to his current mental status, we are unable to obtain any history regarding chest pain, SOB, dizziness or palpitations.   Past Medical History  Diagnosis Date  . Hyperlipidemia   . Hypertension   . Arthritis   . Bladder cancer 2003  . Prediabetes   . Vitamin D deficiency   . BPH (benign prostatic hyperplasia)     Past Surgical History  Procedure Laterality Date  . Cysto  2003  . Tonsillectomy    . Cataract extraction Right     Family History  Problem Relation Age of Onset  . Alzheimer's disease Mother   . Heart disease Mother   . Heart  disease Father     Social History:  reports that he has never smoked. He has never used smokeless tobacco. He reports that he does not drink alcohol or use illicit drugs.  Allergies:  Allergies  Allergen Reactions  . Baycol [Cerivastatin]   . Zocor [Simvastatin]     Medications:  Prior to Admission medications   Medication Sig Start Date End Date Taking? Authorizing Provider  amLODipine (NORVASC) 5 MG tablet Take 5 mg by mouth daily.   Yes Historical Provider, MD  benazepril (LOTENSIN) 40 MG tablet Take 40 mg by mouth daily.   Yes Historical Provider, MD  Cholecalciferol (VITAMIN D PO) Take 2 tablets by mouth daily.   Yes Historical Provider, MD  gemfibrozil (LOPID) 600 MG tablet Take 600 mg by mouth 2 (two) times daily before a meal.   Yes Historical Provider, MD  oxymetazoline (AFRIN) 0.05 % nasal spray Place 1 spray into both nostrils 2 (two) times daily as needed for congestion.   Yes Historical Provider, MD  terazosin (HYTRIN) 10 MG capsule Take 10 mg by mouth 2 (two) times daily.   Yes Historical Provider, MD     Results for orders placed during the hospital encounter of 04/25/13 (from the past 48 hour(s))  VITAMIN B12     Status: None   Collection Time    04/25/13  1:25 AM      Result Value Ref Range  Vitamin B-12 283  211 - 911 pg/mL   Comment: Performed at [facility]     Status: None   Collection Time    04/25/13  1:25 AM      Result Value Ref Range   Folate 8.4     Comment: (NOTE)     Reference Ranges            Deficient:       0.4 - 3.3 ng/mL            Indeterminate:   3.4 - 5.4 ng/mL            Normal:              > 5.4 ng/mL     Performed at [place]     Status: None   Collection Time    04/25/13  1:25 AM      Result Value Ref Range   Ferritin 46  22 - 322 ng/mL   Comment: Performed at [place]     Status: Abnormal   Collection Time    04/25/13  1:25 AM      Result Value Ref Range    Retic Ct Pct 1.7  0.4 - 3.1 %   RBC. 2.40 (*) 4.22 - 5.81 MIL/uL   Retic Count, Manual 40.8  19.0 - 186.0 K/uL  CBC WITH DIFFERENTIAL     Status: Abnormal   Collection Time    04/25/13  6:23 PM      Result Value Ref Range   WBC 10.1  4.0 - 10.5 K/uL   RBC 2.91 (*) 4.22 - 5.81 MIL/uL   Hemoglobin 10.0 (*) 13.0 - 17.0 g/dL   HCT 28.8 (*) 39.0 - 52.0 %   MCV 99.0  78.0 - 100.0 fL   MCH 34.4 (*) 26.0 - 34.0 pg   MCHC 34.7  30.0 - 36.0 g/dL   RDW 13.4  11.5 - 15.5 %   Platelets 178  150 - 400 K/uL   Neutrophils Relative % 91 (*) 43 - 77 %   Neutro Abs 9.2 (*) 1.7 - 7.7 K/uL   Lymphocytes Relative 3 (*) 12 - 46 %   Lymphs Abs 0.3 (*) 0.7 - 4.0 K/uL   Monocytes Relative 5  3 - 12 %   Monocytes Absolute 0.5  0.1 - 1.0 K/uL   Eosinophils Relative 0  0 - 5 %   Eosinophils Absolute 0.0  0.0 - 0.7 K/uL   Basophils Relative 0  0 - 1 %   Basophils Absolute 0.0  0.0 - 0.1 K/uL  BASIC METABOLIC PANEL     Status: Abnormal   Collection Time    04/25/13  6:23 PM      Result Value Ref Range   Sodium 143  137 - 147 mEq/L   Potassium 4.7  3.7 - 5.3 mEq/L   Chloride 108  96 - 112 mEq/L   CO2 21  19 - 32 mEq/L   Glucose, Bld 175 (*) 70 - 99 mg/dL   BUN 38 (*) 6 - 23 mg/dL   Creatinine, Ser 0.80  0.50 - 1.35 mg/dL   Calcium 9.3  8.4 - 10.5 mg/dL   GFR calc non Af Amer 77 (*) >90 mL/min   GFR calc Af Amer 90 (*) >90 mL/min   Comment: (NOTE)     The eGFR has been calculated using the CKD EPI equation.  This calculation has not been validated in all clinical situations.     eGFR's persistently <90 mL/min signify possible Chronic Kidney     Disease.  GLUCOSE, CAPILLARY     Status: Abnormal   Collection Time    04/25/13 11:07 PM      Result Value Ref Range   Glucose-Capillary 143 (*) 70 - 99 mg/dL  COMPREHENSIVE METABOLIC PANEL     Status: Abnormal   Collection Time    04/26/13  1:25 AM      Result Value Ref Range   Sodium 142  137 - 147 mEq/L   Potassium 3.8  3.7 - 5.3 mEq/L   Chloride  107  96 - 112 mEq/L   CO2 22  19 - 32 mEq/L   Glucose, Bld 165 (*) 70 - 99 mg/dL   BUN 36 (*) 6 - 23 mg/dL   Creatinine, Ser 0.87  0.50 - 1.35 mg/dL   Calcium 8.6  8.4 - 10.5 mg/dL   Total Protein 5.1 (*) 6.0 - 8.3 g/dL   Albumin 2.7 (*) 3.5 - 5.2 g/dL   AST 18  0 - 37 U/L   ALT 10  0 - 53 U/L   Alkaline Phosphatase 47  39 - 117 U/L   Total Bilirubin 0.3  0.3 - 1.2 mg/dL   GFR calc non Af Amer 75 (*) >90 mL/min   GFR calc Af Amer 87 (*) >90 mL/min   Comment: (NOTE)     The eGFR has been calculated using the CKD EPI equation.     This calculation has not been validated in all clinical situations.     eGFR's persistently <90 mL/min signify possible Chronic Kidney     Disease.  TROPONIN I     Status: Abnormal   Collection Time    04/26/13  1:25 AM      Result Value Ref Range   Troponin I 0.64 (*) <0.30 ng/mL   Comment:            Due to the release kinetics of cTnI,     a negative result within the first hours     of the onset of symptoms does not rule out     myocardial infarction with certainty.     If myocardial infarction is still suspected,     repeat the test at appropriate intervals.     CRITICAL RESULT CALLED TO, READ BACK BY AND VERIFIED WITH:     Fredia Beets RN [id number] [address], [address]     CALLED ON [id number] AT 0227  CBC WITH DIFFERENTIAL     Status: Abnormal   Collection Time    04/26/13  1:25 AM      Result Value Ref Range   WBC 7.0  4.0 - 10.5 K/uL   RBC 2.43 (*) 4.22 - 5.81 MIL/uL   Hemoglobin 8.4 (*) 13.0 - 17.0 g/dL   HCT 24.0 (*) 39.0 - 52.0 %   MCV 98.8  78.0 - 100.0 fL   MCH 34.6 (*) 26.0 - 34.0 pg   MCHC 35.0  30.0 - 36.0 g/dL   RDW 13.5  11.5 - 15.5 %   Platelets 155  150 - 400 K/uL   Neutrophils Relative % 88 (*) 43 - 77 %   Neutro Abs 6.1  1.7 - 7.7 K/uL   Lymphocytes Relative 6 (*) 12 - 46 %   Lymphs Abs 0.4 (*) 0.7 - 4.0 K/uL   Monocytes Relative 6  3 -  12 %   Monocytes Absolute 0.4  0.1 - 1.0 K/uL   Eosinophils Relative 0  0 - 5 %    Eosinophils Absolute 0.0  0.0 - 0.7 K/uL   Basophils Relative 0  0 - 1 %   Basophils Absolute 0.0  0.0 - 0.1 K/uL  GLUCOSE, CAPILLARY     Status: Abnormal   Collection Time    04/26/13  8:20 AM      Result Value Ref Range   Glucose-Capillary 146 (*) 70 - 99 mg/dL  TROPONIN I     Status: Abnormal   Collection Time    04/26/13  9:00 AM      Result Value Ref Range   Troponin I 1.55 (*) <0.30 ng/mL   Comment:            Due to the release kinetics of cTnI,     a negative result within the first hours     of the onset of symptoms does not rule out     myocardial infarction with certainty.     If myocardial infarction is still suspected,     repeat the test at appropriate intervals.     CRITICAL VALUE NOTED.  VALUE IS CONSISTENT WITH PREVIOUSLY REPORTED AND CALLED VALUE.     CRITICAL VALUE NOTED.  VALUE IS CONSISTENT WITH PREVIOUSLY REPORTED AND CALLED VALUE.  CK TOTAL AND CKMB     Status: Abnormal   Collection Time    04/26/13  9:00 AM      Result Value Ref Range   Total CK 504 (*) 7 - 232 U/L   CK, MB 16.4 (*) 0.3 - 4.0 ng/mL   Comment: CRITICAL RESULT CALLED TO, READ BACK BY AND VERIFIED WITH:     C.KALUMBO,RN 04/26/13 1000 BY BSLADE   Relative Index 3.3 (*) 0.0 - 2.5  LIPID PANEL     Status: Abnormal   Collection Time    04/26/13 10:30 AM      Result Value Ref Range   Cholesterol 102  0 - 200 mg/dL   Triglycerides 72  <150 mg/dL   HDL 38 (*) >39 mg/dL   Total CHOL/HDL Ratio 2.7     VLDL 14  0 - 40 mg/dL   LDL Cholesterol 50  0 - 99 mg/dL   Comment:            Total Cholesterol/HDL:CHD Risk     Coronary Heart Disease Risk Table                         Men   Women      1/2 Average Risk   3.4   3.3      Average Risk       5.0   4.4      2 X Average Risk   9.6   7.1      3 X Average Risk  23.4   11.0                Use the calculated Patient Ratio     above and the CHD Risk Table     to determine the patient's CHD Risk.                ATP III CLASSIFICATION (LDL):       <100     mg/dL   Optimal      100-129  mg/dL   Near or Above  Optimal      130-159  mg/dL   Borderline      160-189  mg/dL   High      >190     mg/dL   Very High  GLUCOSE, CAPILLARY     Status: Abnormal   Collection Time    04/26/13 11:38 AM      Result Value Ref Range   Glucose-Capillary 140 (*) 70 - 99 mg/dL  RETICULOCYTES     Status: Abnormal   Collection Time    04/26/13 12:43 PM      Result Value Ref Range   Retic Ct Pct 2.2  0.4 - 3.1 %   RBC. 2.19 (*) 4.22 - 5.81 MIL/uL   Retic Count, Manual 48.2  19.0 - 186.0 K/uL  TROPONIN I     Status: Abnormal   Collection Time    04/26/13  1:01 PM      Result Value Ref Range   Troponin I 1.54 (*) <0.30 ng/mL   Comment:            Due to the release kinetics of cTnI,     a negative result within the first hours     of the onset of symptoms does not rule out     myocardial infarction with certainty.     If myocardial infarction is still suspected,     repeat the test at appropriate intervals.     CRITICAL VALUE NOTED.  VALUE IS CONSISTENT WITH PREVIOUSLY REPORTED AND CALLED VALUE.  CK TOTAL AND CKMB     Status: Abnormal   Collection Time    04/26/13  1:01 PM      Result Value Ref Range   Total CK 619 (*) 7 - 232 U/L   CK, MB 17.3 (*) 0.3 - 4.0 ng/mL   Comment: CRITICAL VALUE NOTED.  VALUE IS CONSISTENT WITH PREVIOUSLY REPORTED AND CALLED VALUE.   Relative Index 2.8 (*) 0.0 - 2.5    Ct Head Wo Contrast  04/25/2013   CLINICAL DATA:  Fall.  Bruising to left orbit.  EXAM: CT HEAD WITHOUT CONTRAST  CT MAXILLOFACIAL WITHOUT CONTRAST  TECHNIQUE: Multidetector CT imaging of the head and maxillofacial structures were performed using the standard protocol without intravenous contrast. Multiplanar CT image reconstructions of the maxillofacial structures were also generated.  COMPARISON:  None.  FINDINGS: CT HEAD FINDINGS  Diffuse, extensive cerebral atrophy. Dilated extra-axial CSF spaces over the cerebral hemispheres  bilaterally most likely related to the volume loss, or this could be related to chronic subdural hygromas. No hemorrhage. No mass lesion or acute infarction. No hydrocephalus.  No acute calvarial abnormality. Left orbital fractures noted. See maxillofacial report below.  CT MAXILLOFACIAL FINDINGS  Fracture through the left lateral orbital wall and orbital floor. Probable nondisplaced zygomatic arch fracture on the left. Fracture also noted posteriorly within the lateral posterior orbital wall on image 68 of series 6. This is also noted on image 71 of series 6. The fracture line continues laterally into the sphenoid wing and superiorly into the squamosal portion of the left temporal bone.  Small amount of fluid noted in the left maxillary sinus. Remainder the paranasal sinuses are clear. Mastoids are clear. Globes are intact.  IMPRESSION: Diffuse cerebral atrophy. Prominent extra-axial low-density fluid likely related to the atrophy although these could represent chronic subdural hygromas.  No acute intracranial abnormality.  Complex left orbital fracture including anterior and posterior lateral walls, extending laterally into the sphenoid wing, and likely into the  squamosal portion of the temporal bone. Left orbital floor fracture. Nondisplaced zygomatic arch fracture.   Electronically Signed   By: Rolm Baptise M.D.   On: 04/25/2013 17:32   Ct Maxillofacial Wo Cm  04/25/2013   CLINICAL DATA:  Fall.  Bruising to left orbit.  EXAM: CT HEAD WITHOUT CONTRAST  CT MAXILLOFACIAL WITHOUT CONTRAST  TECHNIQUE: Multidetector CT imaging of the head and maxillofacial structures were performed using the standard protocol without intravenous contrast. Multiplanar CT image reconstructions of the maxillofacial structures were also generated.  COMPARISON:  None.  FINDINGS: CT HEAD FINDINGS  Diffuse, extensive cerebral atrophy. Dilated extra-axial CSF spaces over the cerebral hemispheres bilaterally most likely related to the volume  loss, or this could be related to chronic subdural hygromas. No hemorrhage. No mass lesion or acute infarction. No hydrocephalus.  No acute calvarial abnormality. Left orbital fractures noted. See maxillofacial report below.  CT MAXILLOFACIAL FINDINGS  Fracture through the left lateral orbital wall and orbital floor. Probable nondisplaced zygomatic arch fracture on the left. Fracture also noted posteriorly within the lateral posterior orbital wall on image 68 of series 6. This is also noted on image 71 of series 6. The fracture line continues laterally into the sphenoid wing and superiorly into the squamosal portion of the left temporal bone.  Small amount of fluid noted in the left maxillary sinus. Remainder the paranasal sinuses are clear. Mastoids are clear. Globes are intact.  IMPRESSION: Diffuse cerebral atrophy. Prominent extra-axial low-density fluid likely related to the atrophy although these could represent chronic subdural hygromas.  No acute intracranial abnormality.  Complex left orbital fracture including anterior and posterior lateral walls, extending laterally into the sphenoid wing, and likely into the squamosal portion of the temporal bone. Left orbital floor fracture. Nondisplaced zygomatic arch fracture.   Electronically Signed   By: Rolm Baptise M.D.   On: 04/25/2013 17:32    Review of Systems  Unable to perform ROS: mental status change   Blood pressure 89/51, pulse 79, temperature 98.1 F (36.7 C), temperature source Oral, resp. rate 16, height 5' 7"  (1.702 m), weight 169 lb 1.5 oz (76.7 kg), SpO2 98.00%. Physical Exam  Constitutional: He appears well-developed and well-nourished. No distress.  Eyes:  Left periorbital ecchymosis   Neck: No JVD present.  Cardiovascular: Normal rate and regular rhythm.  Exam reveals no gallop and no friction rub.   No murmur heard. Pulses:      Radial pulses are 2+ on the right side, and 2+ on the left side.       Dorsalis pedis pulses are 2+  on the right side, and 2+ on the left side.  Respiratory: Effort normal and breath sounds normal. No respiratory distress. He has no wheezes. He has no rales.  Musculoskeletal: He exhibits no edema.  Neurological: He is alert.  Skin: Skin is warm and dry. He is not diaphoretic.      Syncope    It is my understanding that the patient was found sitting on his toilet, confused. I am not completely sure of the syncope history.    Elevated troponin     There is a total CPK of 600 and elevation of MB. The CK value does not seem compatible with rhabdomyalasis. This most likely is cardiac in origin. At this point it is impossible to say if this is from demand ischemia or possibly a primary cardiac event. However the hemoglobin is 8.4. I suspect that this is demand ischemia.    Hyperlipidemia  Hypertension   Prediabetes   Orbital fracture   Anemia  Plan: 80 y/o male with HTN, HLD and prediabetes, but no known cardiac history, admitted for evaluation for syncope. Was found unresponsive by his son at home with a left forehead laceration, surrounded in a pool of blood. CT of head negative for bleed, but he has sustain a left periorbital fracture. MRI pending. EKG on admit showed sinus rhythm with RBBB and ST depression in the lateral leads and T wave inversion in the inferior leads (? repolarization abnormalities). No old EKG to compare. Troponin's were cycled today and were positive x 3 at 0.64, 1.55 and 1.54. Based on his mental status, we are unable to obtain a history from him regarding any recent CP or dyspnea. ? If he had a true Type I NSTEMI that caused his syncope of if this is Type II, from demand ischemia, secondary to blood loss. Hgb today is 8.4. However, will first check a CPK x 1. Will also await 2D echo results. Will re-evaluate in the am. If he has any improvement in his mental status, we may consider a Lexiscan  Myoview. MD to follow.    SIMMONS, BRITTAINY 04/26/2013, 4:13 PM  Patient  seen and examined. I agree with the assessment and plan as detailed above. See also my additional thoughts below.   I have reviewed all the information in the note above. I have modified the note. The patient was found with an abnormal mental status. His hemoglobin is 8.4. He is still having some loose stools. His rhythm has been stable. Two-dimensional echo is to be done. Recommendation at this time is to continue to follow his hemoglobin and his mental status. We will await the results of his echo. When his mental status improves, I would recommend risk stratification with a nuclear stress study in the hospital. Dola Argyle, MD, [facility] 04/26/2013 5:58 PM

## 2013-04-26 NOTE — Progress Notes (Signed)
PT Cancellation Note  Patient Details Name: Philip Simpson MRN: [id number] DOB: December 05, 1922   Cancelled Treatment:    Reason Eval/Treat Not Completed: Medical issues which prohibited therapy. RN and MD asked to hold due to recent troponin lab value elevated to 1.4. PT to return when appropriate/as able.   [name], [name] 04/26/2013, 11:20 AM

## 2013-04-26 NOTE — Progress Notes (Signed)
UR completed. Patient changed to inpatient- requiring IVF @ 75cc/hr and + troponin

## 2013-04-26 NOTE — Progress Notes (Signed)
CRITICAL VALUE ALERT  Critical value received:  Elevated troponin of 0.64  Date of notification:  04/26/2013  Time of notification:  0230  Critical value read back:yes  Nurse who received alert:  Geanie Berlin, RN  MD notified (1st page):  Kathline Magic, NP  Time of first page:  0235  MD notified (2nd page):  Time of second page:  Responding MD:  Kathline Magic, NP  Time MD responded:  [phone]  NP had RN do a stat EKG and placed an order for serial troponins and CK/CKMB.

## 2013-04-26 NOTE — Progress Notes (Signed)
PT Cancellation Note  Patient Details Name: GREYCEN FELTER MRN: [id number] DOB: 03-12-1922   Cancelled Treatment:    Reason Eval/Treat Not Completed: Patient at procedure or test/unavailable. Pt undergoing bedside EEG. PT to re-attempt as able.   Kingsley Callander 04/26/2013, 8:53 AM

## 2013-04-26 NOTE — Progress Notes (Addendum)
Patient ID: Philip Simpson  male  VQM:[id number]    DOB: [birth year]    DOA: 04/25/2013  PCP: Alesia Richards, MD  Addendum Called by RN that patient is having bloody bowel movements. - Will obtain stat H&H, will transfuse 1 unit packed RBC now, patient is still somewhat hypotensive - Hold aspirin - Clear liquid diet - Will obtain GI consult, d/w Dr Darnelle Going M.D. [facility] 04/26/2013, 5:01 PM  Pager: [phone]     Assessment/Plan: Principal Problem:   Syncope: Hypotensive, possibly cardiac with positive troponins, EKG changes, rule out arrhythmia. Patient also reported diarrhea yesterday and he was rushing to the bathroom when the syncopal episode happened. - Second set of troponins elevated to 1.55, placed on aspirin. Not started on heparin due to left periorbital fracture. - Patient is hypotensive, we'll give one fluid bolus and continue IV fluids. Hold off on amlodipine, benazepril and Hytrin - MRI of the brain and EEG still pending, 2-D echo pending - Cardiology consult called  Active Problems:   Hyperlipidemia -Continue statin    Hypotension with history of  Hypertension: Hold all BP meds, will give one fluid bolus    Prediabetes - Obtain hemoglobin A1c, continue sliding-scale insulin   Nondisplaced Left periorbital fracture - Evaluated by Dr. Benjamine Mola, does not feel patient needs any surgical intervention at this time and follow up outpatient one week after dc    Anemia - Will check anemia panel, FOBT, transfuse pRBC for hb <8  DVT Prophylaxis:SCDs  Code Status:Full code  Family Communication:Discussed with the patient's son at the bedside  Disposition:Will attempt PT evaluation once patient is more stable  Consultants:  Cardiology  Procedures:  None  Antibiotics:  None    Subjective: Patient seen and examined, denies any specific complaints, no chest pain, shortness of breath, palpitations, dizziness  left periorbital region  with ecchymosis  Objective: Weight change:  No intake or output data in the 24 hours ending 04/26/13 1201 Blood pressure 89/51, pulse 79, temperature 98.1 F (36.7 C), temperature source Oral, resp. rate 16, height 5\' 7"  (1.702 m), weight 76.7 kg (169 lb 1.5 oz), SpO2 98.00%.  Physical Exam: General: Alert and awake, oriented x3, not in any acute distress. HEENT: anicteric sclera, PERLA, EOMI, about 2 cm laceration on the left periorbital region with ecchymosis, glued CVS: S1-S2 clear, no murmur rubs or gallops Chest: clear to auscultation bilaterally, no wheezing, rales or rhonchi Abdomen: soft nontender, nondistended, normal bowel sounds  Extremities: no cyanosis, clubbing or edema noted bilaterally Neuro: Cranial nerves II-XII intact, no focal neurological deficits  Lab Results: Basic Metabolic Panel:  Recent Labs Lab 04/25/13 1823 04/26/13 0125  NA 143 142  K 4.7 3.8  CL 108 107  CO2 21 22  GLUCOSE 175* 165*  BUN 38* 36*  CREATININE 0.80 0.87  CALCIUM 9.3 8.6   Liver Function Tests:  Recent Labs Lab 04/26/13 0125  AST 18  ALT 10  ALKPHOS 47  BILITOT 0.3  PROT 5.1*  ALBUMIN 2.7*   No results found for this basename: LIPASE, AMYLASE,  in the last 168 hours No results found for this basename: AMMONIA,  in the last 168 hours CBC:  Recent Labs Lab 04/25/13 1823 04/26/13 0125  WBC 10.1 7.0  NEUTROABS 9.2* 6.1  HGB 10.0* 8.4*  HCT 28.8* 24.0*  MCV 99.0 98.8  PLT 178 155   Cardiac Enzymes:  Recent Labs Lab 04/26/13 0125 04/26/13 0900  CKTOTAL  --  504*  CKMB  --  16.4*  TROPONINI 0.64* 1.55*   BNP: No components found with this basename: POCBNP,  CBG:  Recent Labs Lab 04/25/13 2307 04/26/13 0820 04/26/13 1138  GLUCAP 143* 146* 140*     Micro Results: No results found for this or any previous visit (from the past 240 hour(s)).  Studies/Results: Ct Head Wo Contrast  04/25/2013   CLINICAL DATA:  Fall.  Bruising to left orbit.  EXAM: CT  HEAD WITHOUT CONTRAST  CT MAXILLOFACIAL WITHOUT CONTRAST  TECHNIQUE: Multidetector CT imaging of the head and maxillofacial structures were performed using the standard protocol without intravenous contrast. Multiplanar CT image reconstructions of the maxillofacial structures were also generated.  COMPARISON:  None.  FINDINGS: CT HEAD FINDINGS  Diffuse, extensive cerebral atrophy. Dilated extra-axial CSF spaces over the cerebral hemispheres bilaterally most likely related to the volume loss, or this could be related to chronic subdural hygromas. No hemorrhage. No mass lesion or acute infarction. No hydrocephalus.  No acute calvarial abnormality. Left orbital fractures noted. See maxillofacial report below.  CT MAXILLOFACIAL FINDINGS  Fracture through the left lateral orbital wall and orbital floor. Probable nondisplaced zygomatic arch fracture on the left. Fracture also noted posteriorly within the lateral posterior orbital wall on image 68 of series 6. This is also noted on image 71 of series 6. The fracture line continues laterally into the sphenoid wing and superiorly into the squamosal portion of the left temporal bone.  Small amount of fluid noted in the left maxillary sinus. Remainder the paranasal sinuses are clear. Mastoids are clear. Globes are intact.  IMPRESSION: Diffuse cerebral atrophy. Prominent extra-axial low-density fluid likely related to the atrophy although these could represent chronic subdural hygromas.  No acute intracranial abnormality.  Complex left orbital fracture including anterior and posterior lateral walls, extending laterally into the sphenoid wing, and likely into the squamosal portion of the temporal bone. Left orbital floor fracture. Nondisplaced zygomatic arch fracture.   Electronically Signed   By: Rolm Baptise M.D.   On: 04/25/2013 17:32   Ct Maxillofacial Wo Cm  04/25/2013   CLINICAL DATA:  Fall.  Bruising to left orbit.  EXAM: CT HEAD WITHOUT CONTRAST  CT MAXILLOFACIAL  WITHOUT CONTRAST  TECHNIQUE: Multidetector CT imaging of the head and maxillofacial structures were performed using the standard protocol without intravenous contrast. Multiplanar CT image reconstructions of the maxillofacial structures were also generated.  COMPARISON:  None.  FINDINGS: CT HEAD FINDINGS  Diffuse, extensive cerebral atrophy. Dilated extra-axial CSF spaces over the cerebral hemispheres bilaterally most likely related to the volume loss, or this could be related to chronic subdural hygromas. No hemorrhage. No mass lesion or acute infarction. No hydrocephalus.  No acute calvarial abnormality. Left orbital fractures noted. See maxillofacial report below.  CT MAXILLOFACIAL FINDINGS  Fracture through the left lateral orbital wall and orbital floor. Probable nondisplaced zygomatic arch fracture on the left. Fracture also noted posteriorly within the lateral posterior orbital wall on image 68 of series 6. This is also noted on image 71 of series 6. The fracture line continues laterally into the sphenoid wing and superiorly into the squamosal portion of the left temporal bone.  Small amount of fluid noted in the left maxillary sinus. Remainder the paranasal sinuses are clear. Mastoids are clear. Globes are intact.  IMPRESSION: Diffuse cerebral atrophy. Prominent extra-axial low-density fluid likely related to the atrophy although these could represent chronic subdural hygromas.  No acute intracranial abnormality.  Complex left orbital fracture including anterior and posterior  lateral walls, extending laterally into the sphenoid wing, and likely into the squamosal portion of the temporal bone. Left orbital floor fracture. Nondisplaced zygomatic arch fracture.   Electronically Signed   By: Rolm Baptise M.D.   On: 04/25/2013 17:32    Medications: Scheduled Meds: . aspirin  325 mg Oral Daily  . cholecalciferol  400 Units Oral Daily  . gemfibrozil  600 mg Oral BID AC  . insulin aspart  0-9 Units  Subcutaneous TID WC  . sodium chloride  500 mL Intravenous Once      LOS: 1 day   RAI,RIPUDEEP M.D. [facility] 04/26/2013, 12:01 PM Pager: [phone]  If 7PM-7AM, please contact night-coverage [email or web address] Password [email or web address]

## 2013-04-26 NOTE — Procedures (Signed)
ELECTROENCEPHALOGRAM REPORT  Patient: Philip Simpson       Room #: 0J81 EEG No. ID: [id number] Age: 80 y.o.        Sex: male Referring Physician: Rai Report Date:  04/26/2013        Interpreting Physician: Anthony Sar  History: Philip Simpson is an 80 y.o. male admitted with altered mental status. Patient suffered a fall at home during which she sustained a nondisplaced orbital fracture.  Indications for study:  Assess severity of encephalopathy.  Technique: This is an 18 channel routine scalp EEG performed at the bedside with bipolar and monopolar montages arranged in accordance to the international 10/20 system of electrode placement.   Description: This EEG recording was performed during wakefulness. Predominant background activity consists of one to 2 Hz diffuse low amplitude delta activity with superimposed 5-7 Hz theta activity which was fairly regular posteriorly. Photic stimulation was not performed. Hyperventilation was not performed. No epileptiform discharges are recorded.  Interpretation: This EEG is abnormal with moderate generalized continuous slowing of cerebral activity. This pattern of slowing is nonspecific and can be seen with a wide variety of disorders including metabolic as well as toxic and degenerative disorders. No evidence of an epileptic disorder was demonstrated.   Rush Farmer M.D. [facility] [phone]

## 2013-04-27 ENCOUNTER — Inpatient Hospital Stay (HOSPITAL_COMMUNITY): Payer: Medicare PPO

## 2013-04-27 ENCOUNTER — Encounter (HOSPITAL_COMMUNITY)
Admission: EM | Disposition: A | Discharge status: Skilled Nursing Facility | Payer: Self-pay | Source: Home / Self Care | Attending: Internal Medicine

## 2013-04-27 ENCOUNTER — Encounter (HOSPITAL_COMMUNITY): Payer: Self-pay | Admitting: Physician Assistant

## 2013-04-27 DIAGNOSIS — I214 Non-ST elevation (NSTEMI) myocardial infarction: Secondary | ICD-10-CM | POA: Diagnosis present

## 2013-04-27 DIAGNOSIS — R578 Other shock: Secondary | ICD-10-CM | POA: Diagnosis present

## 2013-04-27 DIAGNOSIS — D649 Anemia, unspecified: Secondary | ICD-10-CM

## 2013-04-27 DIAGNOSIS — K922 Gastrointestinal hemorrhage, unspecified: Secondary | ICD-10-CM

## 2013-04-27 HISTORY — PX: ESOPHAGOGASTRODUODENOSCOPY: SHX5428

## 2013-04-27 LAB — CBC
HCT: 21.6 % — ABNORMAL LOW (ref 39.0–52.0)
HEMATOCRIT: 20.5 % — AB (ref 39.0–52.0)
HEMATOCRIT: 22.3 % — AB (ref 39.0–52.0)
Hemoglobin: 7.2 g/dL — ABNORMAL LOW (ref 13.0–17.0)
Hemoglobin: 8 g/dL — ABNORMAL LOW (ref 13.0–17.0)
Hemoglobin: 7.6 g/dL — ABNORMAL LOW (ref 13.0–17.0)
MCH: 33.3 pg (ref 26.0–34.0)
MCH: 32.9 pg (ref 26.0–34.0)
MCH: 32.3 pg (ref 26.0–34.0)
MCHC: 35.1 g/dL (ref 30.0–36.0)
MCHC: 35.9 g/dL (ref 30.0–36.0)
MCHC: 35.2 g/dL (ref 30.0–36.0)
MCV: 94.9 fL (ref 78.0–100.0)
MCV: 91.8 fL (ref 78.0–100.0)
MCV: 91.9 fL (ref 78.0–100.0)
PLATELETS: 161 10*3/uL (ref 150–400)
Platelets: 150 10*3/uL (ref 150–400)
Platelets: 151 10*3/uL (ref 150–400)
RBC: 2.16 MIL/uL — ABNORMAL LOW (ref 4.22–5.81)
RBC: 2.43 MIL/uL — ABNORMAL LOW (ref 4.22–5.81)
RBC: 2.35 MIL/uL — AB (ref 4.22–5.81)
RDW: 16 % — AB (ref 11.5–15.5)
RDW: 16.9 % — AB (ref 11.5–15.5)
RDW: 17.5 % — ABNORMAL HIGH (ref 11.5–15.5)
WBC: 8.6 10*3/uL (ref 4.0–10.5)
WBC: 9.6 10*3/uL (ref 4.0–10.5)
WBC: 10.4 10*3/uL (ref 4.0–10.5)

## 2013-04-27 LAB — BASIC METABOLIC PANEL
BUN: 53 mg/dL — AB (ref 6–23)
CHLORIDE: 110 meq/L (ref 96–112)
CO2: 18 meq/L — AB (ref 19–32)
CREATININE: 0.9 mg/dL (ref 0.50–1.35)
Calcium: 8.3 mg/dL — ABNORMAL LOW (ref 8.4–10.5)
GFR calc Af Amer: 86 mL/min — ABNORMAL LOW (ref >90)
GFR calc non Af Amer: 74 mL/min — ABNORMAL LOW (ref >90)
GLUCOSE: 151 mg/dL — AB (ref 70–99)
Potassium: 3.7 mEq/L (ref 3.7–5.3)
Sodium: 144 mEq/L (ref 137–147)

## 2013-04-27 LAB — PROTIME-INR
INR: 1.24 (ref 0.00–1.49)
PROTHROMBIN TIME: 15.3 s — AB (ref 11.6–15.2)

## 2013-04-27 LAB — HEMOGLOBIN AND HEMATOCRIT, BLOOD
HCT: 22.8 % — ABNORMAL LOW (ref 39.0–52.0)
Hemoglobin: 8 g/dL — ABNORMAL LOW (ref 13.0–17.0)

## 2013-04-27 LAB — GLUCOSE, CAPILLARY
GLUCOSE-CAPILLARY: 151 mg/dL — AB (ref 70–99)
Glucose-Capillary: 131 mg/dL — ABNORMAL HIGH (ref 70–99)
Glucose-Capillary: 129 mg/dL — ABNORMAL HIGH (ref 70–99)
Glucose-Capillary: 128 mg/dL — ABNORMAL HIGH (ref 70–99)

## 2013-04-27 LAB — LACTIC ACID, PLASMA: Lactic Acid, Venous: 1.7 mmol/L (ref 0.5–2.2)

## 2013-04-27 LAB — PREPARE RBC (CROSSMATCH)

## 2013-04-27 LAB — APTT: aPTT: 30 seconds (ref 24–37)

## 2013-04-27 LAB — MRSA PCR SCREENING: MRSA by PCR: NEGATIVE

## 2013-04-27 LAB — IRON AND TIBC
Iron: 57 ug/dL (ref 42–135)
Saturation Ratios: 18 % — ABNORMAL LOW (ref 20–55)
TIBC: 315 ug/dL (ref 215–435)
UIBC: 258 ug/dL (ref 125–400)

## 2013-04-27 SURGERY — EGD (ESOPHAGOGASTRODUODENOSCOPY)
Anesthesia: Moderate Sedation

## 2013-04-27 MED ORDER — FENTANYL CITRATE 0.05 MG/ML IJ SOLN
INTRAMUSCULAR | Status: AC
  Filled 2013-04-27: qty 2

## 2013-04-27 MED ORDER — DIPHENHYDRAMINE HCL 50 MG/ML IJ SOLN
INTRAMUSCULAR | Status: AC
  Filled 2013-04-27: qty 1

## 2013-04-27 MED ORDER — DIPHENHYDRAMINE HCL 50 MG/ML IJ SOLN
INTRAMUSCULAR | Status: DC | PRN
  Administered 2013-04-27: 12.5 mg via INTRAVENOUS

## 2013-04-27 MED ORDER — SODIUM CHLORIDE 0.9 % IV BOLUS (SEPSIS)
1000.0000 mL | Freq: Once | INTRAVENOUS | Status: AC
  Administered 2013-04-27: 1000 mL via INTRAVENOUS

## 2013-04-27 MED ORDER — MIDAZOLAM HCL 5 MG/ML IJ SOLN
INTRAMUSCULAR | Status: AC
  Filled 2013-04-27: qty 1

## 2013-04-27 MED ORDER — SODIUM CHLORIDE 0.9 % IV BOLUS (SEPSIS)
500.0000 mL | Freq: Once | INTRAVENOUS | Status: AC
  Administered 2013-04-27: 500 mL via INTRAVENOUS

## 2013-04-27 MED ORDER — NOREPINEPHRINE BITARTRATE 1 MG/ML IJ SOLN
2.0000 ug/min | INTRAVENOUS | Status: DC
  Administered 2013-04-27: 10 ug/min via INTRAVENOUS
  Administered 2013-04-28: 30 ug/min via INTRAVENOUS
  Administered 2013-04-29: 26 ug/min via INTRAVENOUS
  Filled 2013-04-27 (×6): qty 16

## 2013-04-27 MED ORDER — INSULIN ASPART 100 UNIT/ML ~~LOC~~ SOLN
0.0000 [IU] | SUBCUTANEOUS | Status: DC
  Administered 2013-04-27: 3 [IU] via SUBCUTANEOUS
  Administered 2013-04-28: 2 [IU] via SUBCUTANEOUS
  Administered 2013-04-28 – 2013-04-29 (×3): 3 [IU] via SUBCUTANEOUS
  Administered 2013-04-29: 2 [IU] via SUBCUTANEOUS

## 2013-04-27 MED ORDER — SODIUM CHLORIDE 0.9 % IV SOLN: INTRAVENOUS | Status: DC

## 2013-04-27 MED ORDER — PANTOPRAZOLE SODIUM 40 MG IV SOLR
40.0000 mg | Freq: Two times a day (BID) | INTRAVENOUS | Status: DC
  Administered 2013-04-27 – 2013-04-28 (×3): 40 mg via INTRAVENOUS
  Filled 2013-04-27 (×4): qty 40

## 2013-04-27 NOTE — Progress Notes (Addendum)
Patient: FREDRICK GEOGHEGAN / Admit Date: 04/25/2013 / Date of Encounter: 04/27/2013, 2:53 PM  Subjective  Went down to endoscopy for EGD but dropped pressure into 60's. Receiving additional blood. CCM consulted and planning for norepi drip. Somnolent and confused this AM.  Objective   Telemetry: NSR PACs  Physical Exam: Blood pressure 66/26, pulse 54, temperature 98.5 F (36.9 C), temperature source Axillary, resp. rate 20, height 5\' 7"  (1.702 m), weight 174 lb 14.4 oz (79.334 kg), SpO2 100.00%. General: Well developed pale elderly WM in no acute distress. Head: Normocephalic, atraumatic, sclera non-icteric, no xanthomas, nares are without discharge. Neck: Just had central line placed. Lungs: Clear bilaterally anteriorly without wheezes, rales, or rhonchi. Breathing is unlabored. Laying flat in no acute distress. Heart: RRR S1 S2 without murmurs, rubs, or gallops.  Abdomen: Soft, non-tender Extremities: No edema.  Neuro: Oriented to name. Sleepy.    Intake/Output Summary (Last 24 hours) at 04/27/13 1453 Last data filed at 04/27/13 1345  Gross per 24 hour  Intake  467.5 ml  Output    101 ml  Net  366.5 ml    Inpatient Medications:  . cholecalciferol  400 Units Oral Daily  . gemfibrozil  600 mg Oral BID AC  . insulin aspart  0-9 Units Subcutaneous TID WC  . pantoprazole (PROTONIX) IV  40 mg Intravenous Q12H   Infusions:  . sodium chloride    . norepinephrine (LEVOPHED) Adult infusion      Labs:  Recent Labs  04/26/13 0125 04/27/13 0535  NA 142 144  K 3.8 3.7  CL 107 110  CO2 22 18*  GLUCOSE 165* 151*  BUN 36* 53*  CREATININE 0.87 0.90  CALCIUM 8.6 8.3*    Recent Labs  04/26/13 0125  AST 18  ALT 10  ALKPHOS 47  BILITOT 0.3  PROT 5.1*  ALBUMIN 2.7*    Recent Labs  04/25/13 1823 04/26/13 0125  04/27/13 0535 04/27/13 1400  WBC 10.1 7.0  --  8.6  --   NEUTROABS 9.2* 6.1  --   --   --   HGB 10.0* 8.4*  < > 7.2* 8.0*  HCT 28.8* 24.0*  < > 20.5* 22.8*  MCV  99.0 98.8  --  94.9  --   PLT 178 155  --  150  --   < > = values in this interval not displayed.  Recent Labs  04/26/13 0125 04/26/13 0900 04/26/13 1301 04/26/13 2150  CKTOTAL  --  504* 619* 853*  CKMB  --  16.4* 17.3*  --   TROPONINI 0.64* 1.55* 1.54* 2.79*   No components found with this basename: POCBNP,  No results found for this basename: HGBA1C,  in the last 72 hours   Radiology/Studies:  Ct Head Wo Contrast  04/25/2013   CLINICAL DATA:  Fall.  Bruising to left orbit.  EXAM: CT HEAD WITHOUT CONTRAST  CT MAXILLOFACIAL WITHOUT CONTRAST  TECHNIQUE: Multidetector CT imaging of the head and maxillofacial structures were performed using the standard protocol without intravenous contrast. Multiplanar CT image reconstructions of the maxillofacial structures were also generated.  COMPARISON:  None.  FINDINGS: CT HEAD FINDINGS  Diffuse, extensive cerebral atrophy. Dilated extra-axial CSF spaces over the cerebral hemispheres bilaterally most likely related to the volume loss, or this could be related to chronic subdural hygromas. No hemorrhage. No mass lesion or acute infarction. No hydrocephalus.  No acute calvarial abnormality. Left orbital fractures noted. See maxillofacial report below.  CT MAXILLOFACIAL FINDINGS  Fracture through  the left lateral orbital wall and orbital floor. Probable nondisplaced zygomatic arch fracture on the left. Fracture also noted posteriorly within the lateral posterior orbital wall on image 68 of series 6. This is also noted on image 71 of series 6. The fracture line continues laterally into the sphenoid wing and superiorly into the squamosal portion of the left temporal bone.  Small amount of fluid noted in the left maxillary sinus. Remainder the paranasal sinuses are clear. Mastoids are clear. Globes are intact.  IMPRESSION: Diffuse cerebral atrophy. Prominent extra-axial low-density fluid likely related to the atrophy although these could represent chronic subdural  hygromas.  No acute intracranial abnormality.  Complex left orbital fracture including anterior and posterior lateral walls, extending laterally into the sphenoid wing, and likely into the squamosal portion of the temporal bone. Left orbital floor fracture. Nondisplaced zygomatic arch fracture.   Electronically Signed   By: Charlett Nose M.D.   On: 04/25/2013 17:32   Ct Maxillofacial Wo Cm  04/25/2013   CLINICAL DATA:  Fall.  Bruising to left orbit.  EXAM: CT HEAD WITHOUT CONTRAST  CT MAXILLOFACIAL WITHOUT CONTRAST  TECHNIQUE: Multidetector CT imaging of the head and maxillofacial structures were performed using the standard protocol without intravenous contrast. Multiplanar CT image reconstructions of the maxillofacial structures were also generated.  COMPARISON:  None.  FINDINGS: CT HEAD FINDINGS  Diffuse, extensive cerebral atrophy. Dilated extra-axial CSF spaces over the cerebral hemispheres bilaterally most likely related to the volume loss, or this could be related to chronic subdural hygromas. No hemorrhage. No mass lesion or acute infarction. No hydrocephalus.  No acute calvarial abnormality. Left orbital fractures noted. See maxillofacial report below.  CT MAXILLOFACIAL FINDINGS  Fracture through the left lateral orbital wall and orbital floor. Probable nondisplaced zygomatic arch fracture on the left. Fracture also noted posteriorly within the lateral posterior orbital wall on image 68 of series 6. This is also noted on image 71 of series 6. The fracture line continues laterally into the sphenoid wing and superiorly into the squamosal portion of the left temporal bone.  Small amount of fluid noted in the left maxillary sinus. Remainder the paranasal sinuses are clear. Mastoids are clear. Globes are intact.  IMPRESSION: Diffuse cerebral atrophy. Prominent extra-axial low-density fluid likely related to the atrophy although these could represent chronic subdural hygromas.  No acute intracranial abnormality.   Complex left orbital fracture including anterior and posterior lateral walls, extending laterally into the sphenoid wing, and likely into the squamosal portion of the temporal bone. Left orbital floor fracture. Nondisplaced zygomatic arch fracture.   Electronically Signed   By: Charlett Nose M.D.   On: 04/25/2013 17:32     Assessment and Plan  1. Possible syncopal spell vs fall in bathroom, c/b periorbital fx 2. ABL anemia with GI bleed s/p 2 U PRBC (nadir 6.2) 3. NSTEMI peak 2.79 thus far, preserved LV function by echo 55-60% 04/26/13 4. Hypotension ? Hemorrhagic or hypovolemic shock 5. Altered mental status, nonspecific EEG pattern  At this time he is not a candidate for traditional MI measures including anticoag or antiplatelet therapy due to ongoing bleeding. NSTEMI may be demand ischemia +/- possible underlying CAD. 2D echo with normal LV function. Further cardiac workup is on hold. Blood pressure prohibits beta blocker initiation. EKG remains abnormal but not significantly changed from admission, still with ST-T changes. Continue supportive care. Appreciate PCCM assistance. Will follow.  Signed, Ronie Spies PA-C   Attending Note:   The patient was seen and  examined.  Agree with assessment and plan as noted above.  Changes made to the above note as needed. 1. Syncope. His "syncopal" episode was most likely due to shock associated with acute blood loss.    2. Elevated Troponin:  Likely a type 2 NSTEMI due to anemia.  At present there is not need to do any further evaluation - he is still significantly hypotensive and we need to resusitate him  Depending on how he does, we could consider further cardiac work up  - presently he is encephalopathic.    Getting ECg   3. GI bleed:  He had / is having a significant GI bleed - BP is still low. Could not complete the EGD due to hypotension.    Thayer Headings, Brooke Bonito., MD, [facility] 04/27/2013, 3:27 PM

## 2013-04-27 NOTE — Evaluation (Signed)
Physical Therapy Evaluation Patient Details Name: Philip Simpson MRN: [id number] DOB: May 16, 1922 Today's Date: 04/27/2013 Time: 4098-1191 PT Time Calculation (min): 29 min  PT Assessment / Plan / Recommendation History of Present Illness  80 y.o. male admitted to [facility] on 04/27/13 with history of hypertension hyperlipidemia prediabetes presents to the ER because of loss of consciousness.  He had head trauma and a facial fx with laceration above the left eye.  Cardiology following as he had elevated triponon.   Cardiology is questioning if he had a true Type I NSTEMI that caused his syncope or if this is Type II, from demand ischemia, secondary to blood loss.  GI also consulted as pt has been having BRBPR and may have a GIB.  Pt has had two blood transfusions of 1 unit PRBCs each time on 3/9 and 04/27/13.    Clinical Impression  Pt is very lethargic today and cannot tell me where he is or about his home situation.  The pt is unable to sit or stand unsupported.  He will likely need SNF level rehab before returning home alone.   PT to follow acutely for deficits listed below and update plan as able.    PT Assessment  Patient needs continued PT services    Follow Up Recommendations  SNF    Does the patient have the potential to tolerate intense rehabilitation     NA  Barriers to Discharge  Unknown support at d/c.  Pt usually lives alone, has a son (who works here) that lives in town.        Equipment Recommendations  Rolling walker with 5" wheels;Wheelchair (measurements PT);Wheelchair cushion (measurements PT);3in1 (PT)    Recommendations for Other Services OT consult   Frequency Min 3X/week    Precautions / Restrictions Precautions Precautions: Fall   Pertinent Vitals/Pain  04/27/13 1140  Vital Signs  Temp 98 F (36.7 C)  Temp src Axillary  Pulse Rate 70  Resp 18  BP 120/90 mmHg  Oxygen Therapy  SpO2 94 %  O2 Device None (Room air)    04/27/13 1152  Vital Signs  Pulse Rate 95  BP  ! 71/49 mmHg (pt moving a lot during BP, may not be accurate)  BP Location Left arm  BP Method Automatic  Patient Position, if appropriate Sitting  Pain Assessment  Pain Assessment No/denies pain         Mobility  Bed Mobility Overal bed mobility: Needs Assistance Bed Mobility: Sit to Supine;Supine to Sit Supine to sit: Max assist Sit to supine: Mod assist General bed mobility comments: Max assist to progress legs and support trunk to get to EOB.  Pt not initiating movement at all during transition until almost half way up then he was using his arms against therapist to pull up.  Sit to supine mod assist of bil legs.  Pt taking trunk to bed on his own.  Transfers Overall transfer level: Needs assistance Equipment used: None Transfers: Sit to/from Stand Sit to Stand: Max assist General transfer comment: Max assist of trunk to get to half squat position EOB.  Pt not strong enough to get to full standing.  Attempted orthostatics, but pt moving throughout sitting and unable to tell if BP was accurate, also not able to stand long enough to stand.          PT Diagnosis: Difficulty walking;Abnormality of gait;Generalized weakness;Altered mental status  PT Problem List: Decreased strength;Decreased activity tolerance;Decreased balance;Decreased cognition;Decreased mobility;Decreased knowledge of use of DME;Decreased safety  awareness;Decreased knowledge of precautions;Cardiopulmonary status limiting activity PT Treatment Interventions: DME instruction;Gait training;Stair training;Functional mobility training;Therapeutic activities;Therapeutic exercise;Balance training;Neuromuscular re-education;Patient/family education;Cognitive remediation     PT Goals(Current goals can be found in the care plan section) Acute Rehab PT Goals Patient Stated Goal: none stated PT Goal Formulation: Patient unable to participate in goal setting Time For Goal Achievement: 05/11/13 Potential to Achieve  Goals: Good  Visit Information  Last PT Received On: 04/27/13 Assistance Needed: +2 (for safety with gait) History of Present Illness: 80 y.o. male admitted to [facility] on 04/27/13 with history of hypertension hyperlipidemia prediabetes presents to the ER because of loss of consciousness.  He had head trauma and a facial fx with laceration above the left eye.  Cardiology following as he had elevated triponon.   Cardiology is questioning if he had a true Type I NSTEMI that caused his syncope or if this is Type II, from demand ischemia, secondary to blood loss.  GI also consulted as pt has been having BRBPR and may have a GIB.  Pt has had two blood transfusions of 1 unit PRBCs each time on 3/9 and 04/27/13.         Prior Selz expects to be discharged to:: Private residence Living Arrangements: Alone Available Help at Discharge: Family;Other (Comment) (son lives in town, but works) Type of Home: House Additional Comments: pt is poor historian info recieved from other staff sources.   Communication Communication: HOH    Cognition  Cognition Arousal/Alertness: Lethargic Behavior During Therapy: Restless Overall Cognitive Status: No family/caregiver present to determine baseline cognitive functioning    Extremity/Trunk Assessment Upper Extremity Assessment Upper Extremity Assessment: Generalized weakness Lower Extremity Assessment Lower Extremity Assessment: Generalized weakness Cervical / Trunk Assessment Cervical / Trunk Assessment: Normal   Balance Balance Overall balance assessment: Needs assistance Sitting-balance support: Bilateral upper extremity supported;Feet supported Sitting balance-Leahy Scale: Poor Sitting balance - Comments: mod assist to maintain sitting balance EOB.  Pt pushing right and posteriorly Postural control: Posterior lean;Right lateral lean Standing balance support: Bilateral upper extremity supported Standing balance-Leahy Scale:  Zero General Comments General comments (skin integrity, edema, etc.): Pt very lethargic.  Hgb is low (getting 1 unit PRBCs today) 7.2 (up from yesterday, but got 1 unit of blood last night).  Bp may be low in sitting.    End of Session PT - End of Session Activity Tolerance: Patient limited by fatigue;Patient limited by lethargy Patient left: in bed;with bed alarm set;with call bell/phone within reach Nurse Communication: Mobility status    Wells Guiles B. Sharisse Rantz, PT, DPT [phone]   04/27/2013, 12:14 PM

## 2013-04-27 NOTE — Progress Notes (Signed)
CRITICAL VALUE ALERT  Critical value received:  HGB   Date of notification:  04/26/13  Time of notification:  2243  Critical value read back:yes  Nurse who received alert:  Williemae Area., RN BSN   MD notified (1st page):  Tylene Fantasia, ARNP  Time of first page:  2302  MD notified (2nd page):  Time of second page:  Responding MD:  Tylene Fantasia, ARNP   Time MD responded:  [phone]

## 2013-04-27 NOTE — Procedures (Signed)
Central Venous Catheter Insertion Procedure Note JOSEY FORCIER [id number] 1922/04/17  Procedure: Insertion of Central Venous Catheter Indications: Assessment of intravascular volume, Drug and/or fluid administration and Frequent blood sampling  Procedure Details Consent: Risks of procedure as well as the alternatives and risks of each were explained to the (patient/caregiver).  Consent for procedure obtained. Time Out: Verified patient identification, verified procedure, site/side was marked, verified correct patient position, special equipment/implants available, medications/allergies/relevent history reviewed, required imaging and test results available.  Performed  Maximum sterile technique was used including antiseptics, cap, gloves, gown, hand hygiene, mask and sheet. Skin prep: Chlorhexidine; local anesthetic administered A antimicrobial bonded/coated single lumen catheter was placed in the right internal jugular vein using the Seldinger technique.  Evaluation Blood flow good Complications: No apparent complications Patient did tolerate procedure well. Chest X-ray ordered to verify placement.  CXR: pending.   Montey Hora, PA - [facility] Pgr: [phone]  or [phone]  Supervised, present for the entire procedure.  Doree Fudge, MD [facility] and [place] Pager: [phone]

## 2013-04-27 NOTE — Consult Note (Signed)
[facility] Consult: 11:26 AM 04/27/2013  LOS: 2 days    Referring Provider: Dr Tana Coast  Primary Care Physician:  Alesia Richards, MD Primary Gastroenterologist:  none Son Juleon Bosley cell [phone]   Reason for Consultation:  Upper GI bleed    HPI: Philip Simpson is a 80 y.o. male.  Hx bladder and skin cancer, HLD, htn.   Doing great at lunch on Saturday per son.   Upset stomach after pizza for dinner Sunday PM.  2AM woke up, fell in BR hit his left head, which bled.   Felt weak but made it to couch.  Son called by neighbor, arrived to see his Dad at 2 PM.  Had a melenic sounding stool when son was there.  Son saw BRB on floor of bathroom, he felt it was from the head injury. Pt was confused.  Pt brought to ED.     Hgb of 10 to 8.4 to 6.2 since admisiion at 1800 last night.  Hgb was 13.3 09/2009 and 12.1 in 01/2013.  1 unit of PRBC given overnight, another just started.  BUN is up.  No coags but no hx bleeding issues or blood thinners.    CT head neg acute changes but + atrophy and left orbital fx.     When he was alert, pt said he had never had colon/egd.   No hx of GI upset, anorexia, weight loss, dysphagia, transfusions, anemia or taking iron supplements.  No NSAIDs or use of ASA.  All hx is from son who knows him well.   Past Medical History  Diagnosis Date  . Hyperlipidemia   . Hypertension   . Arthritis   . Bladder cancer 2003  . Prediabetes   . Vitamin D deficiency   . BPH (benign prostatic hyperplasia)     Past Surgical History  Procedure Laterality Date  . Cysto  2003  . Tonsillectomy    . Cataract extraction Right     Prior to Admission medications   Medication Sig Start Date End Date Taking? Authorizing Provider  amLODipine (NORVASC) 5 MG tablet Take 5 mg by mouth daily.    Yes Historical Provider, MD  benazepril (LOTENSIN) 40 MG tablet Take 40 mg by mouth daily.   Yes Historical Provider, MD  Cholecalciferol (VITAMIN D PO) Take 2 tablets by mouth daily.   Yes Historical Provider, MD  gemfibrozil (LOPID) 600 MG tablet Take 600 mg by mouth 2 (two) times daily before a meal.   Yes Historical Provider, MD  oxymetazoline (AFRIN) 0.05 % nasal spray Place 1 spray into both nostrils 2 (two) times daily as needed for congestion.   Yes Historical Provider, MD  terazosin (HYTRIN) 10 MG capsule Take 10 mg by mouth 2 (two) times daily.   Yes Historical Provider, MD    Scheduled Meds: . cholecalciferol  400 Units Oral Daily  . gemfibrozil  600 mg Oral BID AC  . insulin aspart  0-9 Units Subcutaneous TID WC  . pantoprazole (PROTONIX) IV  40 mg Intravenous Q12H   Infusions:  PRN Meds: acetaminophen   Allergies as of 04/25/2013 - Review Complete 04/25/2013  Allergen Reaction Noted  . Baycol [cerivastatin]  01/27/2013  . Zocor [simvastatin]  01/27/2013    Family History  Problem Relation Age of Onset  . Alzheimer's disease Mother   . Heart disease Mother   . Heart disease Father     History   Social History  . Marital Status: Widowed    Spouse Name: N/A    Number of Children: N/A  . Years of Education: N/A   Occupational History  . Not on file.   Social History Main Topics  . Smoking status: Never Smoker   . Smokeless tobacco: Never Used  . Alcohol Use: No  . Drug Use: No  . Sexual Activity: Not on file   Social History Narrative  . Lives alone.     REVIEW OF SYSTEMS: Constitutional:  Per HPI.   ENT:  No nose bleeds Pulm:  Generally no SOB or cough.  Now with cough CV:  No palpitations, no LE edema.  GU:  No hematuria, no frequency GI:  Per HPI Heme:  Per HPI.  No hx anemia   Transfusions:  None before now Neuro:  No headaches, no peripheral tingling or numbness Derm:  No itching, no rash or sores.  Endocrine:  No sweats or chills.  No  polyuria or dysuria Immunization:  Multiple vaccines are up to date and reviewed Travel:  None beyond local counties in last few months.    PHYSICAL EXAM: Vital signs in last 24 hours: Filed Vitals:   04/27/13 0739  BP: 128/98  Pulse: 78  Temp: 98.1 F (36.7 C)  Resp: 16   Wt Readings from Last 3 Encounters:  04/27/13 79.334 kg (174 lb 14.4 oz)  02/02/13 78.382 kg (172 lb 12.8 oz)    General: confused, somnolent but follows simple commands Head:  Bruise and stitched laceration of left brow, orbit  Eyes:  + conj pallor Ears:  Not obviously HOH  Nose:  No discharge Mouth:  No blood in mouth.  No lesions Neck:  No mass, no JVD Lungs:  + cough, no dyspnea.  No rales.  BS diminished   Heart: RRR.  No MRG Abdomen:  Soft, ND, NT, not obese.  No mass, no bruit, no HSM.   Rectal: medium red, melenic smelling stool.  No mass   Musc/Skeltl: +kyphosis.  Extremities:  No pedal edema.  Feet warm  Neurologic:  Not speaking much, follows commands, not agitated.  Alert and moving 4 limbs.  + tremor of head/trunk Skin:  Some small bruising on trunk Tattoos:  none Nodes:  No cervical adenopathy.    Psych:  Somnolent.  Not agitated.  But mittens in place.   Intake/Output from previous day: 03/09 0701 - 03/10 0700 In: 12.5 [Blood:12.5] Out: 101 [Urine:100; Stool:1] Intake/Output this shift:    LAB RESULTS:  Recent Labs  04/25/13 1823 04/26/13 0125 04/26/13 2150 04/27/13 0535  WBC 10.1 7.0  --  8.6  HGB 10.0* 8.4* 6.2* 7.2*  HCT 28.8* 24.0* 17.7* 20.5*  PLT 178 155  --  150   BMET Lab Results  Component Value Date   NA 144 04/27/2013   NA 142 04/26/2013   NA 143 04/25/2013   K 3.7 04/27/2013   K 3.8 04/26/2013   K 4.7 04/25/2013   CL 110 04/27/2013   CL 107 04/26/2013   CL 108 04/25/2013   CO2 18* 04/27/2013   CO2 22 04/26/2013  CO2 21 04/25/2013   GLUCOSE 151* 04/27/2013   GLUCOSE 165* 04/26/2013   GLUCOSE 175* 04/25/2013   BUN 53* 04/27/2013   BUN 36* 04/26/2013   BUN 38* 04/25/2013    CREATININE 0.90 04/27/2013   CREATININE 0.87 04/26/2013   CREATININE 0.80 04/25/2013   CALCIUM 8.3* 04/27/2013   CALCIUM 8.6 04/26/2013   CALCIUM 9.3 04/25/2013   LFT  Recent Labs  04/26/13 0125  PROT 5.1*  ALBUMIN 2.7*  AST 18  ALT 10  ALKPHOS 47  BILITOT 0.3   PT/INR No results found for this basename: INR, PROTIME   Hepatitis Panel No results found for this basename: HEPBSAG, HCVAB, HEPAIGM, HEPBIGM,  in the last 72 hours C-Diff No components found with this basename: cdiff   Lipase  No results found for this basename: lipase    Drugs of Abuse  No results found for this basename: labopia, cocainscrnur, labbenz, amphetmu, thcu, labbarb     RADIOLOGY STUDIES: Ct Head Wo Contrast Ct Maxillofacial Wo Cm 04/25/2013   .  FINDINGS: CT HEAD FINDINGS  Diffuse, extensive cerebral atrophy. Dilated extra-axial CSF spaces over the cerebral hemispheres bilaterally most likely related to the volume loss, or this could be related to chronic subdural hygromas. No hemorrhage. No mass lesion or acute infarction. No hydrocephalus.  No acute calvarial abnormality. Left orbital fractures noted. See maxillofacial report below.  CT MAXILLOFACIAL FINDINGS  Fracture through the left lateral orbital wall and orbital floor. Probable nondisplaced zygomatic arch fracture on the left. Fracture also noted posteriorly within the lateral posterior orbital wall on image 68 of series 6. This is also noted on image 71 of series 6. The fracture line continues laterally into the sphenoid wing and superiorly into the squamosal portion of the left temporal bone.  Small amount of fluid noted in the left maxillary sinus. Remainder the paranasal sinuses are clear. Mastoids are clear. Globes are intact.  IMPRESSION: Diffuse cerebral atrophy. Prominent extra-axial low-density fluid likely related to the atrophy although these could represent chronic subdural hygromas.  No acute intracranial abnormality.  Complex left orbital  fracture including anterior and posterior lateral walls, extending laterally into the sphenoid wing, and likely into the squamosal portion of the temporal bone. Left orbital floor fracture. Nondisplaced zygomatic arch fracture.   Electronically Signed   By: Rolm Baptise M.D.   On: 04/25/2013 17:32    ENDOSCOPIC STUDIES: none  IMPRESSION:   *  Melenic stool by history.  *  ABL anemia.  MCV normal.  No hx of anemia.    *  Fall in BR, suffered orbital fracture.  No evidence of acute CVA but is confused.      PLAN:     *  Needs EGD after transfused, perhaps later today. Marland Kitchen  Spoke about this with son and will have son sign consent    Azucena Freed  04/27/2013, 11:27 AM Pager: [phone] Attending MD note:   I have taken a history, examined the patient, and reviewed the chart., spoke to the son. Acute upper GI bleed. Pt became unstable after  arrival to endoscopy unit. His blood pressure dropped to 45/40 systolic while blood was infusing. He is being transferred to monitored bed in the CCU. He has positive troponins. 2.79. Will consult hospitalist as to holding off upper endoscopy at this time.  [facility] Pager # [phone]

## 2013-04-27 NOTE — Progress Notes (Signed)
[facility] Progress Note Patient Name: Philip Simpson DOB: 1922/09/19 MRN: [id number]  Date of Service  04/27/2013   HPI/Events of Note  cvp 1 , reminas on 30 levophed On 1 unit prior order   eICU Interventions  Add additional unit prbc, then follow cbc, levo needs   Intervention Category Intermediate Interventions: Hypotension - evaluation and management  Raylene Miyamoto. 04/27/2013, 10:18 PM

## 2013-04-27 NOTE — Progress Notes (Addendum)
Patient ID: Philip Simpson  male  GEX:[id number]    DOB: 1922-05-24    DOA: 04/25/2013  PCP: Alesia Richards, MD    Assessment/Plan: Principal Problem:   Hypovolemic shock.  -Patient having a GI bleed overnight, and continued this morning. -Systolic blood pressures falling into the 70s for which he was transferred to the intensive care unit. -Pulmonary critical care medicine involved, started on IV pressors. -Will continue monitoring H&H, transfuse, IV fluid resuscitation.  Suspected upper GI bleed -Gastroenterology consulted -Patient becoming unstable after arrival to endoscopy units, could not undergo upper endoscopy as planned -Continue blood transfusions, endoscopy when hemodynamically stable. -Continue IV PPI therapy  Acute hemorrhagic anemia -Patient's hemoglobin goal and is 6.2 overnight as he was typed and crossed and transfused with packed red blood cells -Noted by nursing staff to have a red blood, possible this may represent upper GI bleed -GI consulted, however given Hemodynamic instability could not undergo endoscopy today. -Continue monitoring H&H, transfuse as needed   Syncope -Initial CT scan of brain negative -Troponins were cycled, come back positive with peak troponin of 1.55 as cardiology was consulted. -It is possible underlying anemia may have contributed to syncopal event.  Elevated troponins -Could be secondary to demand ischemia from anemia -Continue blood transfusions   Active Problems:   Hyperlipidemia -Continue statin    Hypotension with history of  Hypertension: Hold all BP meds, will give one fluid bolus    Prediabetes - Obtain hemoglobin A1c, continue sliding-scale insulin   Nondisplaced Left periorbital fracture - Evaluated by Dr. Benjamine Mola, does not feel patient needs any surgical intervention at this time and follow up outpatient one week after dc     DVT Prophylaxis:SCDs  Code Status:Full code  Family Communication:Discussed with  the patient's son at the bedside  Disposition: Patient transferred to intensive care unit  Consultants:  Cardiology  GI  PCCM  Procedures:  None  Antibiotics:  None    Subjective: Patient is an 80 year old gentleman with a past medical history of hypertension, dyslipidemia, admitted to the medicine service on 04/25/2013. Family members that reported that patient was highly functional, able to perform all activities of daily living. It appears on the morning of admission he may have had a syncopal event that resulted in head injury. He was brought to the emergency department where CT scan showed a left paravertebral fracture. Was seen and evaluated by ENT, patient not felt to warrant surgical intervention. On the evening of 04/26/2013 he had 3 episodes of bloody stools. Repeat hemoglobin and hematocrit came back low at 6.2 and 17.7. He was typed and crossed and transfused with one unit of packed red blood cells. On the following morning he was administered a second unit of packed red blood cells. At 11:40 AM on 04/27/2013 his blood pressures have remained stable having blood pressure 120/90 then he was sat up with a rapid fall in his blood pressures to 71/49. Transfusion of packed red blood cells were continued however despite this his blood pressures remained low and could not undergo upper endoscopy. At this point he was transferred to the intensive care unit where he was administered blood and started on IV pressors. Patient's son present at bedside was updated on condition.   Objective: Weight change: 2.449 kg (5 lb 6.4 oz)  Intake/Output Summary (Last 24 hours) at 04/27/13 1753 Last data filed at 04/27/13 1600  Gross per 24 hour  Intake 1523.8 ml  Output    100 ml  Net 1423.8  ml   Blood pressure 103/32, pulse 73, temperature 98 F (36.7 C), temperature source Oral, resp. rate 23, height 5\' 7"  (1.702 m), weight 79.334 kg (174 lb 14.4 oz), SpO2 98.00%.  Physical  Exam: General: Patient lethargic, cannot provide a history, he is arousable during my encounter HEENT: anicteric sclera, PERLA, EOMI, about 2 cm laceration on the left periorbital region with ecchymosis, glued CVS: S1-S2 clear, no murmur rubs or gallops Chest: clear to auscultation bilaterally, no wheezing, rales or rhonchi Abdomen: soft nontender, nondistended, normal bowel sounds  Extremities: no cyanosis, clubbing or edema noted bilaterally Neuro: Cranial nerves II-XII intact, no focal neurological deficits  Lab Results: Basic Metabolic Panel:  Recent Labs Lab 04/26/13 0125 04/27/13 0535  NA 142 144  K 3.8 3.7  CL 107 110  CO2 22 18*  GLUCOSE 165* 151*  BUN 36* 53*  CREATININE 0.87 0.90  CALCIUM 8.6 8.3*   Liver Function Tests:  Recent Labs Lab 04/26/13 0125  AST 18  ALT 10  ALKPHOS 47  BILITOT 0.3  PROT 5.1*  ALBUMIN 2.7*   No results found for this basename: LIPASE, AMYLASE,  in the last 168 hours No results found for this basename: AMMONIA,  in the last 168 hours CBC:  Recent Labs Lab 04/26/13 0125  04/27/13 1600 04/27/13 1715  WBC 7.0  < > 9.6 10.4  NEUTROABS 6.1  --   --   --   HGB 8.4*  < > 8.0* 7.6*  HCT 24.0*  < > 22.3* 21.6*  MCV 98.8  < > 91.8 91.9  PLT 155  < > 151 161  < > = values in this interval not displayed. Cardiac Enzymes:  Recent Labs Lab 04/26/13 0125 04/26/13 0900 04/26/13 1301 04/26/13 2150  CKTOTAL  --  504* 619* 853*  CKMB  --  16.4* 17.3*  --   TROPONINI 0.64* 1.55* 1.54* 2.79*   BNP: No components found with this basename: POCBNP,  CBG:  Recent Labs Lab 04/26/13 1642 04/26/13 1948 04/27/13 0742 04/27/13 1138 04/27/13 1543  GLUCAP 131* 146* 131* 129* 128*     Micro Results: Recent Results (from the past 240 hour(s))  MRSA PCR SCREENING     Status: None   Collection Time    04/27/13  2:38 PM      Result Value Ref Range Status   MRSA by PCR NEGATIVE  NEGATIVE Final   Comment:            The GeneXpert  MRSA Assay (FDA     approved for NASAL specimens     only), is one component of a     comprehensive MRSA colonization     surveillance program. It is not     intended to diagnose MRSA     infection nor to guide or     monitor treatment for     MRSA infections.    Studies/Results: Ct Head Wo Contrast  04/25/2013   CLINICAL DATA:  Fall.  Bruising to left orbit.  EXAM: CT HEAD WITHOUT CONTRAST  CT MAXILLOFACIAL WITHOUT CONTRAST  TECHNIQUE: Multidetector CT imaging of the head and maxillofacial structures were performed using the standard protocol without intravenous contrast. Multiplanar CT image reconstructions of the maxillofacial structures were also generated.  COMPARISON:  None.  FINDINGS: CT HEAD FINDINGS  Diffuse, extensive cerebral atrophy. Dilated extra-axial CSF spaces over the cerebral hemispheres bilaterally most likely related to the volume loss, or this could be related to chronic subdural hygromas. No hemorrhage.  No mass lesion or acute infarction. No hydrocephalus.  No acute calvarial abnormality. Left orbital fractures noted. See maxillofacial report below.  CT MAXILLOFACIAL FINDINGS  Fracture through the left lateral orbital wall and orbital floor. Probable nondisplaced zygomatic arch fracture on the left. Fracture also noted posteriorly within the lateral posterior orbital wall on image 68 of series 6. This is also noted on image 71 of series 6. The fracture line continues laterally into the sphenoid wing and superiorly into the squamosal portion of the left temporal bone.  Small amount of fluid noted in the left maxillary sinus. Remainder the paranasal sinuses are clear. Mastoids are clear. Globes are intact.  IMPRESSION: Diffuse cerebral atrophy. Prominent extra-axial low-density fluid likely related to the atrophy although these could represent chronic subdural hygromas.  No acute intracranial abnormality.  Complex left orbital fracture including anterior and posterior lateral walls,  extending laterally into the sphenoid wing, and likely into the squamosal portion of the temporal bone. Left orbital floor fracture. Nondisplaced zygomatic arch fracture.   Electronically Signed   By: Rolm Baptise M.D.   On: 04/25/2013 17:32   Ct Maxillofacial Wo Cm  04/25/2013   CLINICAL DATA:  Fall.  Bruising to left orbit.  EXAM: CT HEAD WITHOUT CONTRAST  CT MAXILLOFACIAL WITHOUT CONTRAST  TECHNIQUE: Multidetector CT imaging of the head and maxillofacial structures were performed using the standard protocol without intravenous contrast. Multiplanar CT image reconstructions of the maxillofacial structures were also generated.  COMPARISON:  None.  FINDINGS: CT HEAD FINDINGS  Diffuse, extensive cerebral atrophy. Dilated extra-axial CSF spaces over the cerebral hemispheres bilaterally most likely related to the volume loss, or this could be related to chronic subdural hygromas. No hemorrhage. No mass lesion or acute infarction. No hydrocephalus.  No acute calvarial abnormality. Left orbital fractures noted. See maxillofacial report below.  CT MAXILLOFACIAL FINDINGS  Fracture through the left lateral orbital wall and orbital floor. Probable nondisplaced zygomatic arch fracture on the left. Fracture also noted posteriorly within the lateral posterior orbital wall on image 68 of series 6. This is also noted on image 71 of series 6. The fracture line continues laterally into the sphenoid wing and superiorly into the squamosal portion of the left temporal bone.  Small amount of fluid noted in the left maxillary sinus. Remainder the paranasal sinuses are clear. Mastoids are clear. Globes are intact.  IMPRESSION: Diffuse cerebral atrophy. Prominent extra-axial low-density fluid likely related to the atrophy although these could represent chronic subdural hygromas.  No acute intracranial abnormality.  Complex left orbital fracture including anterior and posterior lateral walls, extending laterally into the sphenoid wing,  and likely into the squamosal portion of the temporal bone. Left orbital floor fracture. Nondisplaced zygomatic arch fracture.   Electronically Signed   By: Rolm Baptise M.D.   On: 04/25/2013 17:32    Medications: Scheduled Meds: . cholecalciferol  400 Units Oral Daily  . insulin aspart  0-15 Units Subcutaneous 6 times per day  . pantoprazole (PROTONIX) IV  40 mg Intravenous Q12H      LOS: 2 days   Kelvin Cellar M.D. [facility] 04/27/2013, 5:53 PM Pager: [phone]  If 7PM-7AM, please contact night-coverage [email or web address] Password [email or web address]

## 2013-04-27 NOTE — Procedures (Signed)
Arterial Catheter Insertion Procedure Note Philip Simpson [id number] 11/11/1922  Procedure: Insertion of Arterial Catheter  Indications: Blood pressure monitoring and Frequent blood sampling  Procedure Details Consent: Risks of procedure as well as the alternatives and risks of each were explained to the (patient/caregiver).  Consent for procedure obtained. Time Out: Verified patient identification, verified procedure, site/side was marked, verified correct patient position, special equipment/implants available, medications/allergies/relevent history reviewed, required imaging and test results available.  Performed  Maximum sterile technique was used including antiseptics, gloves, gown, hand hygiene, mask and sheet. Skin prep: Chlorhexidine; local anesthetic administered 20 gauge catheter was inserted into right radial artery using the Seldinger technique.  Evaluation Blood flow good; BP tracing good. Complications: No apparent complications.   [name] Jeani Hawking 04/27/2013

## 2013-04-27 NOTE — Progress Notes (Signed)
[facility] Progress Note Patient Name: Philip Simpson DOB: [birth year] MRN: [id number]  Date of Service  04/27/2013   HPI/Events of Note  hgb 7.7 still onp ressors   eICU Interventions  1 unit prbc further, cbc to follow, a line, abg, lactic acid   Intervention Category Major Interventions: Hypotension - evaluation and management  Raylene Miyamoto. 04/27/2013, 6:52 PM

## 2013-04-27 NOTE — Progress Notes (Signed)
Procedure note from Upper endoscopy has not made it into EPIC, so this is a summary of the findings:  Results:   Small reducible hiatal hernia No blood or bleeding from upper GI tract No lesions to account for GIB Shortly following completion of the EGD, pt evacuated large volume of maroon stool  Assessmant: suspect a LGIB- ? Diverticular, or small bowl, doubt ischemic colon  Plan:  discussed with the son, will observe overnight Check H/H q 4 hours Transfuse as needed If bleeding continues in am, will go ahead with nuclear medicine bleeding scan No anticoagulants Will try to transport Endoscopy Procedure  Report into EPIC in am.

## 2013-04-27 NOTE — Consult Note (Signed)
PULMONARY / CRITICAL CARE MEDICINE  Name: Philip Simpson MRN: [id number] DOB: February 10, 1923    ADMISSION DATE:  04/25/2013 CONSULTATION DATE:  04/27/2013  REFERRING MD :  [facility] PRIMARY SERVICE:  PCCM  CHIEF COMPLAINT:  Hypotension  BRIEF PATIENT DESCRIPTION: 80 yo with history of bleeding hemorrhoids admitted 3/8 after syncopal episode and fall resulting in periorbital Simpson.  EKG revealed RBBB and ST-T changes.  Troponin was elevated.  Course was complicated by brisk lower GI hemorrhage.  Endoscopy was planned, but aborted due to hypotension and the patient was brought to ICU for further management.  SIGNIFICANT EVENTS / STUDIES:  3/8    CT head >>> nad, chronic hygromas 3/8    CT maxillofacial >>> L orbital Simpson 3/9    TTE >>> EF 60, grade 1 DD 3/10  Hypotensive, endoscopy aborted, brought to ICU  LINES / TUBES: R IJ Cordis 3/10 >>>  CULTURES:  ANTIBIOTICS:  The patient is encephalopathic and unable to provide history, which was obtained for available medical records.  HISTORY OF PRESENT ILLNESS:  Philip Simpson.  EKG revealed RBBB and ST-T changes.  Troponin was elevated.  Course was complicated by brisk lower GI hemorrhage.  Endoscopy was planned, but aborted due to hypotension and the patient was brought to ICU for further management.  PAST MEDICAL HISTORY :  Past Medical History  Diagnosis Date  . Hyperlipidemia   . Hypertension   . Arthritis   . Bladder cancer 2003  . Prediabetes   . Vitamin D deficiency   . BPH (benign prostatic hyperplasia)   . Skin cancer 2013    sq cell on scalp, basal cell on chest   Past Surgical History  Procedure Laterality Date  . Cysto  2003  . Tonsillectomy    . Cataract extraction Right    Prior to Admission medications   Medication Sig Start Date End Date Taking? Authorizing Provider  amLODipine (NORVASC) 5 MG tablet Take 5 mg  by mouth daily.   Yes Historical Provider, MD  benazepril (LOTENSIN) 40 MG tablet Take 40 mg by mouth daily.   Yes Historical Provider, MD  Cholecalciferol (VITAMIN D PO) Take 2 tablets by mouth daily.   Yes Historical Provider, MD  gemfibrozil (LOPID) 600 MG tablet Take 600 mg by mouth 2 (two) times daily before a meal.   Yes Historical Provider, MD  oxymetazoline (AFRIN) 0.05 % nasal spray Place 1 spray into both nostrils 2 (two) times daily as needed for congestion.   Yes Historical Provider, MD  terazosin (HYTRIN) 10 MG capsule Take 10 mg by mouth 2 (two) times daily.   Yes Historical Provider, MD   Allergies  Allergen Reactions  . Baycol [Cerivastatin]   . Zocor [Simvastatin]    FAMILY HISTORY:  Family History  Problem Relation Age of Onset  . Alzheimer's disease Mother   . Heart disease Mother   . Heart disease Father    SOCIAL HISTORY:  reports that he has never smoked. He has never used smokeless tobacco. He reports that he does not drink alcohol or use illicit drugs.  REVIEW OF SYSTEMS:  Unable to provide.  INTERVAL HISTORY:  VITAL SIGNS: Temp:  [97.3 F (36.3 C)-98.5 F (36.9 C)] 98.5 F (36.9 C) (03/10 1355) Pulse Rate:  [Philip-100] Philip (03/10 1355) Resp:  [16-20] 20 (03/10 1355) BP: (62-128)/(21-98) 66/26 mmHg (03/10 1355) SpO2:  [93 %-100 %] 100 % (  03/10 1305) Weight:  [79.153 kg (174 lb 8 oz)-79.334 kg (174 lb 14.4 oz)] 79.334 kg (174 lb 14.4 oz) (03/10 0418)  HEMODYNAMICS:   VENTILATOR SETTINGS:   INTAKE / OUTPUT: Intake/Output     03/09 0701 - 03/10 0700 03/10 0701 - 03/11 0700   Blood 12.5 455   Total Intake(mL/kg) 12.5 (0.2) 455 (5.7)   Urine (mL/kg/hr) 100 (0.1)    Stool 1 (0)    Total Output 101     Net -88.5 +455        Urine Occurrence 4 x    Stool Occurrence 6 x     PHYSICAL EXAMINATION: General:  Appears acutely ill, pale Neuro:  Confused HEENT:  PERRL, facial ecchymoses Cardiovascular:  RRR, no m/r/g Lungs:  Bilateral diminished air  entry, no w/r/r Abdomen:  Soft, nontender, bowel sounds diminished Musculoskeletal:  Moves all extremities, no edema Skin:  Intact  LABS: CBC  Recent Labs Lab 04/25/13 1823 04/26/13 0125 04/26/13 2150 04/27/13 0535 04/27/13 1400  WBC 10.1 7.0  --  8.6  --   HGB 10.0* 8.4* 6.2* 7.2* 8.0*  HCT 28.8* 24.0* 17.7* 20.5* 22.8*  PLT 178 155  --  150  --    Coag's No results found for this basename: APTT, INR,  in the last 168 hours  BMET  Recent Labs Lab 04/25/13 1823 04/26/13 0125 04/27/13 0535  NA 143 142 144  K 4.7 3.8 3.7  CL 108 107 110  CO2 21 22 18*  BUN 38* 36* 53*  CREATININE 0.80 0.87 0.90  GLUCOSE 175* 165* 151*   Electrolytes  Recent Labs Lab 04/25/13 1823 04/26/13 0125 04/27/13 0535  CALCIUM 9.3 8.6 8.3*   Sepsis Markers No results found for this basename: LATICACIDVEN, PROCALCITON, O2SATVEN,  in the last 168 hours ABG No results found for this basename: PHART, PCO2ART, PO2ART,  in the last 168 hours Liver Enzymes  Recent Labs Lab 04/26/13 0125  AST 18  ALT 10  ALKPHOS 47  BILITOT 0.3  ALBUMIN 2.7*   Cardiac Enzymes  Recent Labs Lab 04/26/13 0900 04/26/13 1301 04/26/13 2150  TROPONINI 1.55* 1.Philip* 2.79*   Glucose  Recent Labs Lab 04/26/13 0820 04/26/13 1138 04/26/13 1642 04/26/13 1948 04/27/13 0742 04/27/13 1138  GLUCAP 146* 140* 131* 146* 131* 129*   IMAGING:  Ct Head Wo Contrast  04/25/2013   CLINICAL DATA:  Fall.  Bruising to left orbit.  EXAM: CT HEAD WITHOUT CONTRAST  CT MAXILLOFACIAL WITHOUT CONTRAST  TECHNIQUE: Multidetector CT imaging of the head and maxillofacial structures were performed using the standard protocol without intravenous contrast. Multiplanar CT image reconstructions of the maxillofacial structures were also generated.  COMPARISON:  None.  FINDINGS: CT HEAD FINDINGS  Diffuse, extensive cerebral atrophy. Dilated extra-axial CSF spaces over the cerebral hemispheres bilaterally most likely related to the  volume loss, or this could be related to chronic subdural hygromas. No hemorrhage. No mass lesion or acute infarction. No hydrocephalus.  No acute calvarial abnormality. Left orbital fractures noted. See maxillofacial report below.  CT MAXILLOFACIAL FINDINGS  Simpson through the left lateral orbital wall and orbital floor. Probable nondisplaced zygomatic arch Simpson on the left. Simpson also noted posteriorly within the lateral posterior orbital wall on image 68 of series 6. This is also noted on image 71 of series 6. The Simpson line continues laterally into the sphenoid wing and superiorly into the squamosal portion of the left temporal bone.  Small amount of fluid noted in the left maxillary  sinus. Remainder the paranasal sinuses are clear. Mastoids are clear. Globes are intact.  IMPRESSION: Diffuse cerebral atrophy. Prominent extra-axial low-density fluid likely related to the atrophy although these could represent chronic subdural hygromas.  No acute intracranial abnormality.  Complex left orbital Simpson including anterior and posterior lateral walls, extending laterally into the sphenoid wing, and likely into the squamosal portion of the temporal bone. Left orbital floor Simpson. Nondisplaced zygomatic arch Simpson.   Electronically Signed   By: Rolm Baptise M.D.   On: 04/25/2013 17:32   Ct Maxillofacial Wo Cm  04/25/2013   CLINICAL DATA:  Fall.  Bruising to left orbit.  EXAM: CT HEAD WITHOUT CONTRAST  CT MAXILLOFACIAL WITHOUT CONTRAST  TECHNIQUE: Multidetector CT imaging of the head and maxillofacial structures were performed using the standard protocol without intravenous contrast. Multiplanar CT image reconstructions of the maxillofacial structures were also generated.  COMPARISON:  None.  FINDINGS: CT HEAD FINDINGS  Diffuse, extensive cerebral atrophy. Dilated extra-axial CSF spaces over the cerebral hemispheres bilaterally most likely related to the volume loss, or this could be related to  chronic subdural hygromas. No hemorrhage. No mass lesion or acute infarction. No hydrocephalus.  No acute calvarial abnormality. Left orbital fractures noted. See maxillofacial report below.  CT MAXILLOFACIAL FINDINGS  Simpson through the left lateral orbital wall and orbital floor. Probable nondisplaced zygomatic arch Simpson on the left. Simpson also noted posteriorly within the lateral posterior orbital wall on image 68 of series 6. This is also noted on image 71 of series 6. The Simpson line continues laterally into the sphenoid wing and superiorly into the squamosal portion of the left temporal bone.  Small amount of fluid noted in the left maxillary sinus. Remainder the paranasal sinuses are clear. Mastoids are clear. Globes are intact.  IMPRESSION: Diffuse cerebral atrophy. Prominent extra-axial low-density fluid likely related to the atrophy although these could represent chronic subdural hygromas.  No acute intracranial abnormality.  Complex left orbital Simpson including anterior and posterior lateral walls, extending laterally into the sphenoid wing, and likely into the squamosal portion of the temporal bone. Left orbital floor Simpson. Nondisplaced zygomatic arch Simpson.   Electronically Signed   By: Rolm Baptise M.D.   On: 04/25/2013 17:32   ASSESSMENT / PLAN:  PULMONARY A:   No active issues Protects airway P:   Goal SpO2>92 Supplemental oxygen   CARDIOVASCULAR A:  NSTEMI vs demand ischemia Chronic diastolic heart failure Hemorrhagic shock P:  Cardiology following Goal MAP>60 Trend troponin / lactate Levophed gtt Place large bore central line No ASA - hemorrhage No BB/ACEI - shock  RENAL A:   Preserved renal function Metabolic acidosis, likely lactic P:   Goal CVP 8-10 Trend BMP NS 500 boluses PRN to goal CVP  GASTROINTESTINAL A:   GI hemorrhage, likely lower Hx bleeding hemorrhoids P:   NPO Place NG Protonix bid GI aware, will notify when  hemodynamically stable Consider bleeding scan  HEMATOLOGIC A:   Acute blood loss anemia  P:  Goal Hb>10 as active hemorrhage Transfused 2 units PRBC Trend CBC q6h APTT / INR  INFECTIOUS A:   No active issues P:   No intervention required  ENDOCRINE  A:   DM P:   SSI  NEUROLOGIC A:   Fall Syncope Zygomatic Fx P:   Defer further work up for now  I have personally obtained history, examined patient, evaluated and interpreted laboratory and imaging results, reviewed medical records, formulated assessment / plan and placed orders.  CRITICAL  CARE:  The patient is critically ill with multiple organ systems failure and requires high complexity decision making for assessment and support, frequent evaluation and titration of therapies, application of advanced monitoring technologies and extensive interpretation of multiple databases. Critical Care Time devoted to patient care services described in this note is 45 minutes.   Doree Fudge, MD Pulmonary and [place] Pager: [phone]  04/27/2013, 3:04 PM

## 2013-04-28 ENCOUNTER — Encounter (HOSPITAL_COMMUNITY)
Admission: EM | Disposition: A | Discharge status: Skilled Nursing Facility | Payer: Self-pay | Source: Home / Self Care | Attending: Internal Medicine

## 2013-04-28 ENCOUNTER — Inpatient Hospital Stay (HOSPITAL_COMMUNITY): Payer: Medicare PPO

## 2013-04-28 ENCOUNTER — Encounter (HOSPITAL_COMMUNITY): Payer: Self-pay | Admitting: Internal Medicine

## 2013-04-28 DIAGNOSIS — E785 Hyperlipidemia, unspecified: Secondary | ICD-10-CM

## 2013-04-28 DIAGNOSIS — K922 Gastrointestinal hemorrhage, unspecified: Secondary | ICD-10-CM

## 2013-04-28 DIAGNOSIS — I214 Non-ST elevation (NSTEMI) myocardial infarction: Secondary | ICD-10-CM

## 2013-04-28 DIAGNOSIS — R933 Abnormal findings on diagnostic imaging of other parts of digestive tract: Secondary | ICD-10-CM

## 2013-04-28 HISTORY — PX: ENTEROSCOPY: SHX5533

## 2013-04-28 LAB — BASIC METABOLIC PANEL
BUN: 62 mg/dL — ABNORMAL HIGH (ref 6–23)
CHLORIDE: 115 meq/L — AB (ref 96–112)
CO2: 16 mEq/L — ABNORMAL LOW (ref 19–32)
Calcium: 7 mg/dL — ABNORMAL LOW (ref 8.4–10.5)
Creatinine, Ser: 1 mg/dL (ref 0.50–1.35)
GFR calc Af Amer: 75 mL/min — ABNORMAL LOW (ref >90)
GFR, EST NON AFRICAN AMERICAN: 65 mL/min — AB (ref >90)
Glucose, Bld: 159 mg/dL — ABNORMAL HIGH (ref 70–99)
POTASSIUM: 4.6 meq/L (ref 3.7–5.3)
SODIUM: 146 meq/L (ref 137–147)

## 2013-04-28 LAB — CBC
HCT: 25.3 % — ABNORMAL LOW (ref 39.0–52.0)
HCT: 22.2 % — ABNORMAL LOW (ref 39.0–52.0)
HCT: 27.1 % — ABNORMAL LOW (ref 39.0–52.0)
HEMATOCRIT: 17.3 % — AB (ref 39.0–52.0)
HEMOGLOBIN: 6.2 g/dL — AB (ref 13.0–17.0)
Hemoglobin: 9 g/dL — ABNORMAL LOW (ref 13.0–17.0)
Hemoglobin: 8.1 g/dL — ABNORMAL LOW (ref 13.0–17.0)
Hemoglobin: 9.7 g/dL — ABNORMAL LOW (ref 13.0–17.0)
MCH: 31.5 pg (ref 26.0–34.0)
MCH: 32 pg (ref 26.0–34.0)
MCH: 31.6 pg (ref 26.0–34.0)
MCH: 31.5 pg (ref 26.0–34.0)
MCHC: 35.6 g/dL (ref 30.0–36.0)
MCHC: 36.5 g/dL — ABNORMAL HIGH (ref 30.0–36.0)
MCHC: 35.8 g/dL (ref 30.0–36.0)
MCHC: 35.8 g/dL (ref 30.0–36.0)
MCV: 88.5 fL (ref 78.0–100.0)
MCV: 87.7 fL (ref 78.0–100.0)
MCV: 88.3 fL (ref 78.0–100.0)
MCV: 88 fL (ref 78.0–100.0)
PLATELETS: 113 10*3/uL — AB (ref 150–400)
PLATELETS: 110 10*3/uL — AB (ref 150–400)
Platelets: 136 10*3/uL — ABNORMAL LOW (ref 150–400)
Platelets: 95 10*3/uL — ABNORMAL LOW (ref 150–400)
RBC: 2.86 MIL/uL — AB (ref 4.22–5.81)
RBC: 2.53 MIL/uL — ABNORMAL LOW (ref 4.22–5.81)
RBC: 1.96 MIL/uL — ABNORMAL LOW (ref 4.22–5.81)
RBC: 3.08 MIL/uL — ABNORMAL LOW (ref 4.22–5.81)
RDW: 17.5 % — AB (ref 11.5–15.5)
RDW: 17.7 % — AB (ref 11.5–15.5)
RDW: 18 % — ABNORMAL HIGH (ref 11.5–15.5)
RDW: 16.8 % — AB (ref 11.5–15.5)
WBC: 11.6 10*3/uL — ABNORMAL HIGH (ref 4.0–10.5)
WBC: 8.9 10*3/uL (ref 4.0–10.5)
WBC: 7.1 10*3/uL (ref 4.0–10.5)
WBC: 10.5 10*3/uL (ref 4.0–10.5)

## 2013-04-28 LAB — GLUCOSE, CAPILLARY
GLUCOSE-CAPILLARY: 112 mg/dL — AB (ref 70–99)
GLUCOSE-CAPILLARY: 132 mg/dL — AB (ref 70–99)
GLUCOSE-CAPILLARY: 153 mg/dL — AB (ref 70–99)
GLUCOSE-CAPILLARY: 155 mg/dL — AB (ref 70–99)
GLUCOSE-CAPILLARY: 166 mg/dL — AB (ref 70–99)
GLUCOSE-CAPILLARY: 64 mg/dL — AB (ref 70–99)
Glucose-Capillary: 109 mg/dL — ABNORMAL HIGH (ref 70–99)

## 2013-04-28 LAB — TROPONIN I: Troponin I: 15.27 ng/mL (ref <0.30)

## 2013-04-28 LAB — LACTIC ACID, PLASMA: LACTIC ACID, VENOUS: 1.4 mmol/L (ref 0.5–2.2)

## 2013-04-28 LAB — POCT I-STAT 3, ART BLOOD GAS (G3+)
Acid-base deficit: 8 mmol/L — ABNORMAL HIGH (ref 0.0–2.0)
BICARBONATE: 15.6 meq/L — AB (ref 20.0–24.0)
O2 Saturation: 98 %
PH ART: 7.396 (ref 7.350–7.450)
TCO2: 16 mmol/L (ref 0–100)
pCO2 arterial: 25.4 mmHg — ABNORMAL LOW (ref 35.0–45.0)
pO2, Arterial: 112 mmHg — ABNORMAL HIGH (ref 80.0–100.0)

## 2013-04-28 LAB — PREPARE RBC (CROSSMATCH)

## 2013-04-28 SURGERY — ENTEROSCOPY
Anesthesia: Moderate Sedation

## 2013-04-28 MED ORDER — PANTOPRAZOLE SODIUM 40 MG IV SOLR: 40.0000 mg | INTRAVENOUS | Status: DC

## 2013-04-28 MED ORDER — PANTOPRAZOLE SODIUM 40 MG IV SOLR
40.0000 mg | INTRAVENOUS | Status: DC
  Filled 2013-04-28: qty 40

## 2013-04-28 MED ORDER — NITROGLYCERIN 1 MG/10 ML FOR IR/CATH LAB
INTRA_ARTERIAL | Status: AC
  Filled 2013-04-28: qty 10

## 2013-04-28 MED ORDER — DIPHENHYDRAMINE HCL 50 MG/ML IJ SOLN
INTRAMUSCULAR | Status: AC
  Filled 2013-04-28: qty 1

## 2013-04-28 MED ORDER — FENTANYL CITRATE 0.05 MG/ML IJ SOLN
INTRAMUSCULAR | Status: AC
  Filled 2013-04-28: qty 2

## 2013-04-28 MED ORDER — TECHNETIUM TC 99M-LABELED RED BLOOD CELLS IV KIT
25.0000 | PACK | Freq: Once | INTRAVENOUS | Status: AC | PRN
  Administered 2013-04-28: 25 via INTRAVENOUS

## 2013-04-28 MED ORDER — MIDAZOLAM HCL 10 MG/2ML IJ SOLN
INTRAMUSCULAR | Status: DC | PRN
  Administered 2013-04-28: 2 mg via INTRAVENOUS
  Administered 2013-04-28: 1 mg via INTRAVENOUS

## 2013-04-28 MED ORDER — PANTOPRAZOLE SODIUM 40 MG PO TBEC: 40.0000 mg | DELAYED_RELEASE_TABLET | Freq: Every day | ORAL | Status: DC

## 2013-04-28 MED ORDER — DIPHENHYDRAMINE HCL 50 MG/ML IJ SOLN
25.0000 mg | Freq: Four times a day (QID) | INTRAMUSCULAR | Status: DC | PRN
  Administered 2013-04-29: 25 mg via INTRAVENOUS
  Filled 2013-04-28: qty 1

## 2013-04-28 MED ORDER — FENTANYL CITRATE 0.05 MG/ML IJ SOLN
INTRAMUSCULAR | Status: AC | PRN
  Administered 2013-04-28 (×3): 25 ug via INTRAVENOUS

## 2013-04-28 MED ORDER — MIDAZOLAM HCL 2 MG/2ML IJ SOLN
INTRAMUSCULAR | Status: AC | PRN
  Administered 2013-04-28: 0.5 mg via INTRAVENOUS
  Administered 2013-04-28: 1 mg via INTRAVENOUS
  Administered 2013-04-28 (×2): 0.5 mg via INTRAVENOUS

## 2013-04-28 MED ORDER — IOHEXOL 300 MG/ML  SOLN
150.0000 mL | Freq: Once | INTRAMUSCULAR | Status: AC | PRN
  Administered 2013-04-28: 110 mL via INTRAVENOUS

## 2013-04-28 MED ORDER — FENTANYL CITRATE 0.05 MG/ML IJ SOLN
INTRAMUSCULAR | Status: DC | PRN
  Administered 2013-04-28 (×2): 25 ug via INTRAVENOUS

## 2013-04-28 MED ORDER — FENTANYL CITRATE 0.05 MG/ML IJ SOLN
INTRAMUSCULAR | Status: AC
  Filled 2013-04-28: qty 4

## 2013-04-28 MED ORDER — MIDAZOLAM HCL 2 MG/2ML IJ SOLN
INTRAMUSCULAR | Status: AC
  Filled 2013-04-28: qty 4

## 2013-04-28 MED ORDER — MIDAZOLAM HCL 5 MG/ML IJ SOLN
INTRAMUSCULAR | Status: AC
  Filled 2013-04-28: qty 2

## 2013-04-28 NOTE — Consult Note (Signed)
I have seen and examined the patient and agree with the assessment and plans. Still needs the source of bleeding mobilized,  He tells me, however, that he does not want major surgery.  Keyera Hattabaugh A. Ninfa Linden  MD, FACS

## 2013-04-28 NOTE — Sedation Documentation (Addendum)
Levophed increased to 28mcg/min.  PRBC's infusing.  Increased rate to 143ml/hr

## 2013-04-28 NOTE — Progress Notes (Signed)
IR aware of request for Visceral angio, possible embo due to GI bleed. D/w Dr. Olevia Perches who did not see source on initial upper endoscopy but informed me of plans for further enteroscopy today. Will review imaging with Dr. Kathlene Cote. If IR is to proceed with angiogram please contact department at [phone] or can further discuss with Dr. Kathlene Cote [phone]  Ascencion Dike PA-C Interventional Radiology, [phone] 04/28/2013 2:54 PM

## 2013-04-28 NOTE — Sedation Documentation (Addendum)
Cleaned for large amount of maroon stool. Back on bed, ready fot Tx to floor.

## 2013-04-28 NOTE — Op Note (Signed)
[facility] [place], [id number]   OPERATIVE PROCEDURE REPORT  PATIENT: Philip Simpson, Philip Simpson  MR#: [id number] BIRTHDATE: [birth year] , 89  yrs. old GENDER: Male ENDOSCOPIST: Lafayette Dragon, MD REFERRED BY:  Dr Dia Crawford PROCEDURE DATE: 04/28/2013 PROCEDURE:   Diagnostic small bowel enteroscopy ASA CLASS:   Class III INDICATIONS:1.  control bleeding.   2.  anemia.   3.  large volume painless hematochezia.  Positive nuclear medicine bleeding scan in upper abdomen either in transverse colon or in the proximal jejunum, upper endoscopy yesterday was negative. MEDICATIONS: These medications were titrated to patient response per physician's verbal order, Fentanyl 25 mcg IV, and Versed 3 mg IV  TOPICAL ANESTHETIC:   none  DESCRIPTION OF PROCEDURE:   After the risks benefits and alternatives of the procedure were thoroughly explained, informed consent was obtained.  The Pentax VSB-2900  endoscope was introduced through the mouth  and advanced to the jejunum , limited by Without limitations.   The instrument was slowly withdrawn as the mucosa was fully examined.  Retroflexed views revealed no abnormalities.    The scope was then withdrawn from the patient and the procedure terminated.  COMPLICATIONS: There were no complications. ENDOSCOPIC IMPRESSION:  NO BLEEDING NOTED  Esophagus: proximal mid and distal esophageal mucosa appeared normal. Stomach: Gastric folds antrum and pyloric outlet was normal.  Duodenum: Duodenal bulb and descending duodenum appeared normal there was no blood or bleeding in the lumen Jejunum: Jejunal mucosa appeared normal there were no AV malformations block or any evidence of remote bleeding endoscope traversed 270 cm from the incisors each was so distal to the ligament of Treitz area Endo Clip was placed for location there was no blood seen in this area. Slow withdrawal of the scope revealed normal appearing mucosa with  a small amount of bilious material it is my impression that BX U. GI bleed is located distal to the ligament of Treitz either in the distal small bowel or in the colon  RECOMMENDATIONS: proceed with visceral arteriogram REPEAT EXAM: no  _______________________________ eSignedLafayette Dragon, MD 04/28/2013 7:11 PM   CC:  PATIENT NAME:  Cruzita Lederer MR#: [id number]

## 2013-04-28 NOTE — Progress Notes (Signed)
SUBJECTIVE:  Sleepy this afternoon  OBJECTIVE:   Vitals:   Filed Vitals:   04/28/13 0100 04/28/13 0300 04/28/13 0700 04/28/13 0800  BP:      Pulse: 59 68 58 74  Temp:    97.5 F (36.4 C)  TempSrc:    Oral  Resp: 16     Height:      Weight:      SpO2: 100% 100% 100% 100%   I&O's:   Intake/Output Summary (Last 24 hours) at 04/28/13 1003 Last data filed at 04/28/13 0800  Gross per 24 hour  Intake 2717.38 ml  Output    580 ml  Net 2137.38 ml   TELEMETRY: Reviewed telemetry pt in NSR with PAC's     PHYSICAL EXAM General: Well developed, well nourished, in no acute distress Head: Eyes PERRLA, No xanthomas.   Normal cephalic and atramatic  Lungs:   Coarse rhonchi with cough Heart:   HRRR S1 S2 Pulses are 2+ & equal. Abdomen: Bowel sounds are positive, abdomen soft and non-tender without masses  Extremities:   No clubbing, cyanosis or edema.  DP +1 Neuro: Alert and oriented X 3. Psych:  Good affect, responds appropriately   LABS: Basic Metabolic Panel:  Recent Labs  04/27/13 0535 04/28/13 0157  NA 144 146  K 3.7 4.6  CL 110 115*  CO2 18* 16*  GLUCOSE 151* 159*  BUN 53* 62*  CREATININE 0.90 1.00  CALCIUM 8.3* 7.0*   Liver Function Tests:  Recent Labs  04/26/13 0125  AST 18  ALT 10  ALKPHOS 47  BILITOT 0.3  PROT 5.1*  ALBUMIN 2.7*   No results found for this basename: LIPASE, AMYLASE,  in the last 72 hours CBC:  Recent Labs  04/25/13 1823 04/26/13 0125  04/28/13 0157 04/28/13 0604  WBC 10.1 7.0  < > 11.6* 8.9  NEUTROABS 9.2* 6.1  --   --   --   HGB 10.0* 8.4*  < > 9.0* 8.1*  HCT 28.8* 24.0*  < > 25.3* 22.2*  MCV 99.0 98.8  < > 88.5 87.7  PLT 178 155  < > 136* 113*  < > = values in this interval not displayed. Cardiac Enzymes:  Recent Labs  04/26/13 0125 04/26/13 0900 04/26/13 1301 04/26/13 2150  CKTOTAL  --  504* 619* 853*  CKMB  --  16.4* 17.3*  --   TROPONINI 0.64* 1.55* 1.54* 2.79*   BNP: No components found with this  basename: POCBNP,  D-Dimer: No results found for this basename: DDIMER,  in the last 72 hours Hemoglobin A1C: No results found for this basename: HGBA1C,  in the last 72 hours Fasting Lipid Panel:  Recent Labs  04/26/13 1030  CHOL 102  HDL 38*  LDLCALC 50  TRIG 72  CHOLHDL 2.7   Thyroid Function Tests: No results found for this basename: TSH, T4TOTAL, FREET3, T3FREE, THYROIDAB,  in the last 72 hours Anemia Panel:  Recent Labs  04/26/13 1243  VITAMINB12 266  FOLATE 8.7  FERRITIN 45  TIBC 311  IRON 82  RETICCTPCT 2.2   Coag Panel:   Lab Results  Component Value Date   INR 1.24 04/27/2013    RADIOLOGY: Ct Head Wo Contrast  04/25/2013   CLINICAL DATA:  Fall.  Bruising to left orbit.  EXAM: CT HEAD WITHOUT CONTRAST  CT MAXILLOFACIAL WITHOUT CONTRAST  TECHNIQUE: Multidetector CT imaging of the head and maxillofacial structures were performed using the standard protocol without intravenous contrast. Multiplanar CT image  reconstructions of the maxillofacial structures were also generated.  COMPARISON:  None.  FINDINGS: CT HEAD FINDINGS  Diffuse, extensive cerebral atrophy. Dilated extra-axial CSF spaces over the cerebral hemispheres bilaterally most likely related to the volume loss, or this could be related to chronic subdural hygromas. No hemorrhage. No mass lesion or acute infarction. No hydrocephalus.  No acute calvarial abnormality. Left orbital fractures noted. See maxillofacial report below.  CT MAXILLOFACIAL FINDINGS  Fracture through the left lateral orbital wall and orbital floor. Probable nondisplaced zygomatic arch fracture on the left. Fracture also noted posteriorly within the lateral posterior orbital wall on image 68 of series 6. This is also noted on image 71 of series 6. The fracture line continues laterally into the sphenoid wing and superiorly into the squamosal portion of the left temporal bone.  Small amount of fluid noted in the left maxillary sinus. Remainder the  paranasal sinuses are clear. Mastoids are clear. Globes are intact.  IMPRESSION: Diffuse cerebral atrophy. Prominent extra-axial low-density fluid likely related to the atrophy although these could represent chronic subdural hygromas.  No acute intracranial abnormality.  Complex left orbital fracture including anterior and posterior lateral walls, extending laterally into the sphenoid wing, and likely into the squamosal portion of the temporal bone. Left orbital floor fracture. Nondisplaced zygomatic arch fracture.   Electronically Signed   By: Rolm Baptise M.D.   On: 04/25/2013 17:32   Dg Chest Port 1 View  04/27/2013   CLINICAL DATA A central line.  GI bleed both wall  EXAM PORTABLE CHEST - 1 VIEW  COMPARISON 10/13/2009  FINDINGS Examination is limited by rightward rotation. This is the best obtainable exam due to patient condition.  Right IJ catheter, tip at the mid to upper SVC. No pneumothorax or hemothorax. Cardiomegaly, comparison with prior limited due to above. Interstitial coarsening with interstitial crowding from hypoaeration.  IMPRESSION Right IJ catheter without adverse features.  SIGNATURE  Electronically Signed   By: Jorje Guild M.D.   On: 04/27/2013 15:56   Ct Maxillofacial Wo Cm  04/25/2013   CLINICAL DATA:  Fall.  Bruising to left orbit.  EXAM: CT HEAD WITHOUT CONTRAST  CT MAXILLOFACIAL WITHOUT CONTRAST  TECHNIQUE: Multidetector CT imaging of the head and maxillofacial structures were performed using the standard protocol without intravenous contrast. Multiplanar CT image reconstructions of the maxillofacial structures were also generated.  COMPARISON:  None.  FINDINGS: CT HEAD FINDINGS  Diffuse, extensive cerebral atrophy. Dilated extra-axial CSF spaces over the cerebral hemispheres bilaterally most likely related to the volume loss, or this could be related to chronic subdural hygromas. No hemorrhage. No mass lesion or acute infarction. No hydrocephalus.  No acute calvarial abnormality.  Left orbital fractures noted. See maxillofacial report below.  CT MAXILLOFACIAL FINDINGS  Fracture through the left lateral orbital wall and orbital floor. Probable nondisplaced zygomatic arch fracture on the left. Fracture also noted posteriorly within the lateral posterior orbital wall on image 68 of series 6. This is also noted on image 71 of series 6. The fracture line continues laterally into the sphenoid wing and superiorly into the squamosal portion of the left temporal bone.  Small amount of fluid noted in the left maxillary sinus. Remainder the paranasal sinuses are clear. Mastoids are clear. Globes are intact.  IMPRESSION: Diffuse cerebral atrophy. Prominent extra-axial low-density fluid likely related to the atrophy although these could represent chronic subdural hygromas.  No acute intracranial abnormality.  Complex left orbital fracture including anterior and posterior lateral walls, extending laterally into  the sphenoid wing, and likely into the squamosal portion of the temporal bone. Left orbital floor fracture. Nondisplaced zygomatic arch fracture.   Electronically Signed   By: Rolm Baptise M.D.   On: 04/25/2013 17:32      ASSESSMENT:  1.  Acute GI bleed - no evidence of upper source.  Presumed diverticular or small bowel.  Received 2 units of PRBCs and now Hbg is 8.  He continues to have melanotic maroon colored stools 2.  Hypovolemic shock on Levophed for pressure control 3.  Dyslipidemia 4.  Prediabetes 5.  Syncope with orbital fracture 6.  Anemia secondary to #1 7.  NSTEMI most likely type II secondary to demand ischemia from GI bleed and profound anemia +/- underlying CAD.  2D echo with normal LVF.   PLAN:   1.  Severe anemia and ongoing GI bleed with hypovolemic shock prohibits traditional treatment for MI (ASA/Heparin/beta blocker) 2.  Transfuse to keep Hbg >8-9 3.  Further cardiac workup on hold until GI bleed resolved  Sueanne Margarita, MD  04/28/2013  10:03 AM

## 2013-04-28 NOTE — Progress Notes (Signed)
[place] Progress Note Patient Name: Philip Simpson DOB: 04-Dec-1922 MRN: [id number]  Date of Service  04/28/2013   HPI/Events of Note   Actively bleeding, in shock Just finished 2 U PRBC but still on 31mcg/min levo  eICU Interventions  Transfuse 1 U PRBC now Keep 2 ahead   Intervention Category Intermediate Interventions: Bleeding - evaluation and treatment with blood products;Hypotension - evaluation and management  MCQUAID, DOUGLAS 04/28/2013, 6:56 PM

## 2013-04-28 NOTE — Procedures (Signed)
Successful peripheral SMA rt colic branch 26mm and 62mm coil embolization for an acute lower GI bleed angiographically in the hepatic flexure No immed comp Stable

## 2013-04-28 NOTE — Progress Notes (Signed)
[facility] PROGRESS NOTE  Philip Simpson RCV:[id number] DOB: 1923/01/04 DOA: 04/25/2013 PCP: Alesia Richards, MD  Assessment/Plan:  Hypovolemic shock.  -Patient having a GI bleed overnight, and continued this morning.  -Systolic blood pressures falling into the 70s for which he was transferred to the intensive care unit.  -Pulmonary critical care medicine involved, started on IV pressors.  -Will continue monitoring H&H, transfuse, IV fluid resuscitation.  -2 unit PRBC 04/27/2013?  Suspected upper GI bleed  -Gastroenterology consulted  -Patient becoming unstable after arrival to endoscopy units, could not undergo upper endoscopy as planned  -Continue blood transfusions, endoscopy when hemodynamically stable.  -Continue IV PPI therapy  -Patient on his way for nuclear medicine bleeding scan  Acute hemorrhagic anemia  -Patient's hemoglobin goal and is 6.2 overnight as he was typed and crossed and transfused with packed red blood cells  -Noted by nursing staff to have a red blood, possible this may represent upper GI bleed  -GI consulted, however given Hemodynamic instability could not undergo endoscopy today.  -Continue monitoring H&H, transfuse as needed   Syncope  -Initial CT scan of brain negative  -Troponins were cycled, come back positive with peak troponin of 1.55 as cardiology was consulted.  -It is possible underlying anemia may have contributed to syncopal event.   Elevated troponins  -Could be secondary to demand ischemia from anemia  -Continue blood transfusions (patient received 2 unit PRBC 3/10)  Hyperlipidemia  -Continue statin  Hypotension with history of Hypertension: Hold all BP meds, will give one fluid bolus   Prediabetes  - Obtain hemoglobin A1c, continue sliding-scale insulin   Nondisplaced Left periorbital fracture  - Evaluated by Dr. Benjamine Mola, does not feel patient needs any surgical intervention at this time and follow up outpatient one week after  dc      Code Status: Full Family Communication: Son present Disposition Plan: Per GI   Consultants: Dr. Delfin Edis (GI) Dr. Merrie Roof (critical-care)   Procedures: EGD by Dr. Olevia Perches 04/27/2013 Small reducible hiatal hernia  No blood or bleeding from upper GI tract  No lesions to account for GIB  Shortly following completion of the EGD, pt evacuated large volume of maroon stool  Central venous catheter insertion 04/27/2013   Antibiotics:    HPI/Subjective: Patient is an 80 yo gentleman  PMHx  hypertension, dyslipidemia, anemia, admitted to the medicine service on 04/25/2013. Family members that reported that patient was highly functional, able to perform all activities of daily living. It appears on the morning of admission he may have had a syncopal event that resulted in head injury. He was brought to the emergency department where CT scan showed a left paravertebral fracture. Was seen and evaluated by ENT, patient not felt to warrant surgical intervention. On the evening of 04/26/2013 he had 3 episodes of bloody stools. Repeat hemoglobin and hematocrit came back low at 6.2 and 17.7. He was typed and crossed and transfused with one unit of packed red blood cells. On the following morning he was administered a second unit of packed red blood cells. At 11:40 AM on 04/27/2013 his blood pressures have remained stable having blood pressure 120/90 then he was sat up with a rapid fall in his blood pressures to 71/49. Transfusion of packed red blood cells were continued however despite this his blood pressures remained low and could not undergo upper endoscopy. At this point he was transferred to the intensive care unit where he was administered blood and started on IV pressors. Patient's son  present at bedside was updated on condition.   Objective: Filed Vitals:   04/28/13 0010 04/28/13 0030 04/28/13 0100 04/28/13 0300  BP:      Pulse:  63 59 68  Temp:  98.5 F (36.9 C)     TempSrc:  Oral    Resp: 19 16 16    Height:      Weight:      SpO2:  100% 100% 100%    Intake/Output Summary (Last 24 hours) at 04/28/13 0720 Last data filed at 04/28/13 0700  Gross per 24 hour  Intake 2712.68 ml  Output    580 ml  Net 2132.68 ml   Filed Weights   04/26/13 0400 04/26/13 2355 04/27/13 0418  Weight: 76.7 kg (169 lb 1.5 oz) 79.153 kg (174 lb 8 oz) 79.334 kg (174 lb 14.4 oz)    Exam:   General: Arousable, improved mentation, son feels close to baseline   Cardiovascular: Regular rhythm and rate, negative murmurs rubs or gallops  Respiratory: Clear to auscultation bilateral  Abdomen: Soft, nontender, nondistended, normal bowel sounds  Musculoskeletal: Negative pedal edema   Data Reviewed: Basic Metabolic Panel:  Recent Labs Lab 04/25/13 1823 04/26/13 0125 04/27/13 0535 04/28/13 0157  NA 143 142 144 146  K 4.7 3.8 3.7 4.6  CL 108 107 110 115*  CO2 21 22 18* 16*  GLUCOSE 175* 165* 151* 159*  BUN 38* 36* 53* 62*  CREATININE 0.80 0.87 0.90 1.00  CALCIUM 9.3 8.6 8.3* 7.0*   Liver Function Tests:  Recent Labs Lab 04/26/13 0125  AST 18  ALT 10  ALKPHOS 47  BILITOT 0.3  PROT 5.1*  ALBUMIN 2.7*   No results found for this basename: LIPASE, AMYLASE,  in the last 168 hours No results found for this basename: AMMONIA,  in the last 168 hours CBC:  Recent Labs Lab 04/25/13 1823 04/26/13 0125  04/27/13 0535 04/27/13 1400 04/27/13 1600 04/27/13 1715 04/28/13 0157 04/28/13 0604  WBC 10.1 7.0  --  8.6  --  9.6 10.4 11.6* 8.9  NEUTROABS 9.2* 6.1  --   --   --   --   --   --   --   HGB 10.0* 8.4*  < > 7.2* 8.0* 8.0* 7.6* 9.0* 8.1*  HCT 28.8* 24.0*  < > 20.5* 22.8* 22.3* 21.6* 25.3* 22.2*  MCV 99.0 98.8  --  94.9  --  91.8 91.9 88.5 87.7  PLT 178 155  --  150  --  151 161 136* 113*  < > = values in this interval not displayed. Cardiac Enzymes:  Recent Labs Lab 04/26/13 0125 04/26/13 0900 04/26/13 1301 04/26/13 2150  CKTOTAL  --  504*  619* 853*  CKMB  --  16.4* 17.3*  --   TROPONINI 0.64* 1.55* 1.54* 2.79*   BNP (last 3 results) No results found for this basename: PROBNP,  in the last 8760 hours CBG:  Recent Labs Lab 04/27/13 1138 04/27/13 1543 04/27/13 2056 04/28/13 0040 04/28/13 0440  GLUCAP 129* 128* 151* 153* 109*    Recent Results (from the past 240 hour(s))  MRSA PCR SCREENING     Status: None   Collection Time    04/27/13  2:38 PM      Result Value Ref Range Status   MRSA by PCR NEGATIVE  NEGATIVE Final   Comment:            The GeneXpert MRSA Assay (FDA     approved for NASAL specimens  only), is one component of a     comprehensive MRSA colonization     surveillance program. It is not     intended to diagnose MRSA     infection nor to guide or     monitor treatment for     MRSA infections.     Studies: Dg Chest Port 1 View  04/27/2013   CLINICAL DATA A central line.  GI bleed both wall  EXAM PORTABLE CHEST - 1 VIEW  COMPARISON 10/13/2009  FINDINGS Examination is limited by rightward rotation. This is the best obtainable exam due to patient condition.  Right IJ catheter, tip at the mid to upper SVC. No pneumothorax or hemothorax. Cardiomegaly, comparison with prior limited due to above. Interstitial coarsening with interstitial crowding from hypoaeration.  IMPRESSION Right IJ catheter without adverse features.  SIGNATURE  Electronically Signed   By: Jorje Guild M.D.   On: 04/27/2013 15:56    Scheduled Meds: . cholecalciferol  400 Units Oral Daily  . insulin aspart  0-15 Units Subcutaneous 6 times per day  . pantoprazole (PROTONIX) IV  40 mg Intravenous Q12H   Continuous Infusions: . norepinephrine (LEVOPHED) Adult infusion 5.013 mcg/min (04/28/13 0645)    Principal Problem:   Syncope Active Problems:   Hyperlipidemia   Hypertension   Prediabetes   Orbital fracture   Anemia   Hemorrhage of gastrointestinal tract, unspecified   Hemorrhagic shock   Lower GI bleed   NSTEMI  (non-ST elevated myocardial infarction)    Time spent: 35 minute   Handy Mcloud, [place], J  [facility] Pager [phone]. If 7PM-7AM, please contact night-coverage at [email or web address], password [facility] 04/28/2013, 7:20 AM  LOS: 3 days

## 2013-04-28 NOTE — Sedation Documentation (Signed)
Blood transfusion complete.  Saline flushing

## 2013-04-28 NOTE — Sedation Documentation (Signed)
Pedal pulses by doppler per Tillman Sers, RT

## 2013-04-28 NOTE — Sedation Documentation (Signed)
Exoseal closure by Dr Annamaria Boots

## 2013-04-28 NOTE — Consult Note (Signed)
PULMONARY / CRITICAL CARE MEDICINE  Name: Philip Simpson MRN: [id number] DOB: October 14, 1922    ADMISSION DATE:  04/25/2013 CONSULTATION DATE:  04/27/2013  REFERRING MD :  [facility] PRIMARY SERVICE:  PCCM  CHIEF COMPLAINT:  Hypotension  BRIEF PATIENT DESCRIPTION: 80 yo with history of bleeding hemorrhoids admitted 3/8 after syncopal episode and fall resulting in periorbital fracture.  EKG revealed RBBB and ST-T changes.  Troponin was elevated.  Course was complicated by brisk lower GI hemorrhage.  Endoscopy was planned, but aborted due to hypotension and the patient was brought to ICU for further management.  SIGNIFICANT EVENTS / STUDIES:  3/8    CT head >>> nad, chronic hygromas 3/8    CT maxillofacial >>> L orbital fracture 3/9    TTE >>> EF 60, grade 1 DD 3/10  Hypotensive, endoscopy aborted, brought to ICU 3/10  EGD >>> Small hiatal hernia, no active hemorrhage 3/11  NM Bleeding Scan >>>  LINES / TUBES: R IJ Cordis 3/10 >>>  CULTURES:  ANTIBIOTICS:  INTERVAL HISTORY:  EGD unrevealing.  Active bleeding per rectum.  Pressor requirements decreased.  Mentation improved.  VITAL SIGNS: Temp:  [97.5 F (36.4 C)-99 F (37.2 C)] 97.5 F (36.4 C) (03/11 0800) Pulse Rate:  [34-110] 74 (03/11 0800) Resp:  [15-25] 16 (03/11 0100) BP: (62-120)/(21-90) 108/47 mmHg (03/10 2110) SpO2:  [83 %-100 %] 100 % (03/11 0800) Arterial Line BP: (104-149)/(28-47) 111/28 mmHg (03/11 0800)  HEMODYNAMICS: CVP:  [4 mmHg] 4 mmHg VENTILATOR SETTINGS:   INTAKE / OUTPUT: Intake/Output     03/10 0701 - 03/11 0700 03/11 0701 - 03/12 0700   I.V. (mL/kg) 602.7 (7.6) 4.7 (0.1)   Blood 1110    IV Piggyback 1000    Total Intake(mL/kg) 2712.7 (34.2) 4.7 (0.1)   Urine (mL/kg/hr) 580 (0.3)    Stool     Total Output 580     Net +2132.7 +4.7        Urine Occurrence  1 x   Stool Occurrence 7 x 1 x    PHYSICAL EXAMINATION: General:  Appears acutely ill, not in distress Neuro:  Awake, alert HEENT:  PERRL, facial  ecchymoses Cardiovascular:  RRR, no m/r/g Lungs:  CTAB Abdomen:  Soft, nontender, bowel sounds diminished Musculoskeletal:  Moves all extremities, no edema Skin:  Intact  LABS: CBC  Recent Labs Lab 04/27/13 1715 04/28/13 0157 04/28/13 0604  WBC 10.4 11.6* 8.9  HGB 7.6* 9.0* 8.1*  HCT 21.6* 25.3* 22.2*  PLT 161 136* 113*   Coag's  Recent Labs Lab 04/27/13 1600  APTT 30  INR 1.24    BMET  Recent Labs Lab 04/26/13 0125 04/27/13 0535 04/28/13 0157  NA 142 144 146  K 3.8 3.7 4.6  CL 107 110 115*  CO2 22 18* 16*  BUN 36* 53* 62*  CREATININE 0.87 0.90 1.00  GLUCOSE 165* 151* 159*   Electrolytes  Recent Labs Lab 04/26/13 0125 04/27/13 0535 04/28/13 0157  CALCIUM 8.6 8.3* 7.0*   Sepsis Markers  Recent Labs Lab 04/27/13 1615 04/28/13 0158  LATICACIDVEN 1.7 1.4   ABG  Recent Labs Lab 04/28/13 0357  PHART 7.396  PCO2ART 25.4*  PO2ART 112.0*   Liver Enzymes  Recent Labs Lab 04/26/13 0125  AST 18  ALT 10  ALKPHOS 47  BILITOT 0.3  ALBUMIN 2.7*   Cardiac Enzymes  Recent Labs Lab 04/26/13 0900 04/26/13 1301 04/26/13 2150  TROPONINI 1.55* 1.54* 2.79*   Glucose  Recent Labs Lab 04/27/13 1138 04/27/13 1543  04/27/13 2056 04/28/13 0040 04/28/13 0440 04/28/13 0741  GLUCAP 129* 128* 151* 153* 109* 132*   IMAGING:  Dg Chest Port 1 View  04/27/2013   CLINICAL DATA A central line.  GI bleed both wall  EXAM PORTABLE CHEST - 1 VIEW  COMPARISON 10/13/2009  FINDINGS Examination is limited by rightward rotation. This is the best obtainable exam due to patient condition.  Right IJ catheter, tip at the mid to upper SVC. No pneumothorax or hemothorax. Cardiomegaly, comparison with prior limited due to above. Interstitial coarsening with interstitial crowding from hypoaeration.  IMPRESSION Right IJ catheter without adverse features.  SIGNATURE  Electronically Signed   By: Jorje Guild M.D.   On: 04/27/2013 15:56   ASSESSMENT /  PLAN:  PULMONARY A:   No active issues Protects airway P:   Goal SpO2>92 Supplemental oxygen   CARDIOVASCULAR A:  NSTEMI vs demand ischemia Chronic diastolic heart failure Hemorrhagic shock, decreased pressor requirements, lactate reassuring P:  Cardiology following Goal MAP>65 Trend troponin Levophed gtt No ASA - hemorrhage No BB/ACEI - shock  RENAL A:   Preserved renal function Metabolic acidosis, likely lactic P:   Goal CVP 8-10 Trend BMP NS 500 boluses PRN to goal CVP  GASTROINTESTINAL A:   GI hemorrhage, likely lower Hx bleeding hemorrhoids P:   GI following NPO Protonix, change to daily Await results of bleeding scan  HEMATOLOGIC A:   Acute blood loss anemia  P:  Goal Hb>10 as active hemorrhage Trend CBC q6h  INFECTIOUS A:   No active issues P:   No intervention required  ENDOCRINE  A:   DM P:   SSI  NEUROLOGIC A:   Fall Syncope Zygomatic Fx P:   Defer further work up for now  I have personally obtained history, examined patient, evaluated and interpreted laboratory and imaging results, reviewed medical records, formulated assessment / plan and placed orders.  CRITICAL CARE:  The patient is critically ill with multiple organ systems failure and requires high complexity decision making for assessment and support, frequent evaluation and titration of therapies, application of advanced monitoring technologies and extensive interpretation of multiple databases. Critical Care Time devoted to patient care services described in this note is 35 minutes.   Doree Fudge, MD [facility] [place] Pager: [phone]  04/28/2013, 10:40 AM

## 2013-04-28 NOTE — Sedation Documentation (Addendum)
Continues to complain of hip pain.  Dr Annamaria Boots requests meds.  Meds given through blood line with saline flush before and after.  Difficulty getting femoral access.

## 2013-04-28 NOTE — Sedation Documentation (Signed)
Dr Annamaria Boots aware of BP, no sedation at this time per md.  Pt c/o right hip pain, positioned for comfort on table.

## 2013-04-28 NOTE — Sedation Documentation (Signed)
Pt art line dislodged, pt trying move off table.  Pressure being held.  Art line not used through out case do to inaccuracy.

## 2013-04-28 NOTE — Consult Note (Signed)
Philip Simpson 14-Jul-1922  [id number].   Requesting MD: Dr. Oretha Caprice Chief Complaint/Reason for Consult: GI bleed HPI: This is an 80 yo male who apparently was having GI bleeding for the last week at home.  He apparently got up to use the restroom and fell and hit his head.  He is a little sketchy on these details.  He was then brought to the [facility] where he was found to have had an NSTEMI.  He was admitted.  He was noticed to have a GI bleed with anemia.  He has been having BRBPR.  His hgb has dropped today from 8 to 6.  He is currently receiving his 5th unit I believe.  He denies abdominal pain.  He went for a nuclear medicine bleeding scan that revealed abnormal GI bleeding which was felt to possibly be in the small bowel in the upper mid abdomen in the RUQ, ? Duodenum vs proximal jejunum.  GI was planning to rescope him today, but in discussion with IR, they may proceed with angiography first as there is a possibility this could still be the colon instead of small bowel.  If all of this fails and he continues to bleed, we have been asked to be on board for possible surgery.  ROS: Please see HPI, otherwise all other systems are currently negative.  He admits to some dizziness with getting up.  Family History  Problem Relation Age of Onset  . Alzheimer's disease Mother   . Heart disease Mother   . Heart disease Father     Past Medical History  Diagnosis Date  . Hyperlipidemia   . Hypertension   . Arthritis   . Bladder cancer 2003  . Prediabetes   . Vitamin D deficiency   . BPH (benign prostatic hyperplasia)   . 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 Cysto  2003  . Tonsillectomy    . Cataract extraction Right   . Esophagogastroduodenoscopy N/A 04/27/2013    Procedure: ESOPHAGOGASTRODUODENOSCOPY (EGD);  Surgeon: Lafayette Dragon, MD;  Location: [facility] ENDOSCOPY;  Service: Endoscopy;  Laterality: N/A;     Social History:  reports that he has never smoked. He has never used smokeless tobacco. He reports that he does not drink alcohol or use illicit drugs.  Allergies:  Allergies  Allergen Reactions  . Baycol [Cerivastatin]   . Zocor [Simvastatin]     Medications Prior to Admission  Medication Sig Dispense Refill  . amLODipine (NORVASC) 5 MG tablet Take 5 mg by mouth daily.      . benazepril (LOTENSIN) 40 MG tablet Take 40 mg by mouth daily.      . Cholecalciferol (VITAMIN D PO) Take 2 tablets by mouth daily.      Marland Kitchen gemfibrozil (LOPID) 600 MG tablet Take 600 mg by mouth 2 (two) times daily before a meal.      . oxymetazoline (AFRIN) 0.05 % nasal spray Place 1 spray into both nostrils 2 (two) times daily as needed for congestion.      Marland Kitchen terazosin (HYTRIN) 10 MG capsule Take 10 mg by mouth 2 (two) times daily.        Blood pressure 108/47, pulse 63, temperature 98 F (36.7 C), temperature source Oral, resp. rate 16, height 5' 7"  (1.702 m), weight 174 lb 14.4 oz (79.334 kg), SpO2 100.00%. Physical Exam: General: pleasant, WD, WN white male who is  laying in bed in NAD HEENT: head is normocephalic.  He has a ecchymosis around his left eye from a fall with a small laceration to the most lateral portion of his eye.   Sclera are noninjected.  PERRL.  Ears and nose without any masses or lesions.  Mouth is pink and moist Heart: regular, rate, and rhythm.  Normal s1,s2. No obvious murmurs, gallops, or rubs noted.  Palpable radial and pedal pulses bilaterally Lungs: diffuse rhonchi.  Respiratory effort nonlabored Abd: soft, NT, ND, +BS, no masses, hernias, or organomegaly MS: all 4 extremities are symmetrical with no cyanosis, clubbing, or edema. Skin: warm and dry with no masses, lesions, or rashes Psych: A&Ox3 with an appropriate affect.    Results for orders placed during the hospital encounter of 04/25/13 (from the past 48 hour(s))  GLUCOSE, CAPILLARY     Status: Abnormal   Collection  Time    04/26/13  4:42 PM      Result Value Ref Range   Glucose-Capillary 131 (*) 70 - 99 mg/dL  GLUCOSE, CAPILLARY     Status: Abnormal   Collection Time    04/26/13  7:48 PM      Result Value Ref Range   Glucose-Capillary 146 (*) 70 - 99 mg/dL   Comment 1 Notify RN    OCCULT BLOOD X 1 CARD TO LAB, STOOL     Status: Abnormal   Collection Time    04/26/13  9:10 PM      Result Value Ref Range   Fecal Occult Bld POSITIVE (*) NEGATIVE  TROPONIN I     Status: Abnormal   Collection Time    04/26/13  9:50 PM      Result Value Ref Range   Troponin I 2.79 (*) <0.30 ng/mL   Comment:            Due to the release kinetics of cTnI,     a negative result within the first hours     of the onset of symptoms does not rule out     myocardial infarction with certainty.     If myocardial infarction is still suspected,     repeat the test at appropriate intervals.     CRITICAL VALUE NOTED.  VALUE IS CONSISTENT WITH PREVIOUSLY REPORTED AND CALLED VALUE.  HEMOGLOBIN AND HEMATOCRIT, BLOOD     Status: Abnormal   Collection Time    04/26/13  9:50 PM      Result Value Ref Range   Hemoglobin 6.2 (*) 13.0 - 17.0 g/dL   Comment: REPEATED TO VERIFY     CRITICAL RESULT CALLED TO, READ BACK BY AND VERIFIED WITHLaury Deep RN [id number] GREEN R   HCT 17.7 (*) 39.0 - 52.0 %  TYPE AND SCREEN     Status: None   Collection Time    04/26/13  9:50 PM      Result Value Ref Range   ABO/RH(D) B NEG     Antibody Screen NEG     Sample Expiration 04/29/2013     Unit Number [id number]     Blood Component Type RED CELLS,LR     Unit division 00     Status of Unit ISSUED,FINAL     Transfusion Status OK TO TRANSFUSE     Crossmatch Result Compatible     Unit Number [id number]     Blood Component Type RED CELLS,LR     Unit division 00     Status of  Unit ISSUED,FINAL     Transfusion Status OK TO TRANSFUSE     Crossmatch Result Compatible     Unit Number [id number]     Blood Component Type RED  CELLS,LR     Unit division 00     Status of Unit ISSUED,FINAL     Transfusion Status OK TO TRANSFUSE     Crossmatch Result Compatible     Unit Number [id number]     Blood Component Type RED CELLS,LR     Unit division 00     Status of Unit ISSUED,FINAL     Transfusion Status OK TO TRANSFUSE     Crossmatch Result Compatible     Unit Number [id number]     Blood Component Type RED CELLS,LR     Unit division 00     Status of Unit ALLOCATED     Transfusion Status OK TO TRANSFUSE     Crossmatch Result Compatible     Unit Number [id number]     Blood Component Type RED CELLS,LR     Unit division 00     Status of Unit ISSUED     Transfusion Status OK TO TRANSFUSE     Crossmatch Result Compatible    PREPARE RBC (CROSSMATCH)     Status: None   Collection Time    04/26/13  9:50 PM      Result Value Ref Range   Order Confirmation ORDER PROCESSED BY BLOOD BANK    CK     Status: Abnormal   Collection Time    04/26/13  9:50 PM      Result Value Ref Range   Total CK 853 (*) 7 - 232 U/L  ABO/RH     Status: None   Collection Time    04/26/13  9:50 PM      Result Value Ref Range   ABO/RH(D) B NEG    BASIC METABOLIC PANEL     Status: Abnormal   Collection Time    04/27/13  5:35 AM      Result Value Ref Range   Sodium 144  137 - 147 mEq/L   Potassium 3.7  3.7 - 5.3 mEq/L   Chloride 110  96 - 112 mEq/L   CO2 18 (*) 19 - 32 mEq/L   Glucose, Bld 151 (*) 70 - 99 mg/dL   BUN 53 (*) 6 - 23 mg/dL   Creatinine, Ser 0.90  0.50 - 1.35 mg/dL   Calcium 8.3 (*) 8.4 - 10.5 mg/dL   GFR calc non Af Amer 74 (*) >90 mL/min   GFR calc Af Amer 86 (*) >90 mL/min   Comment: (NOTE)     The eGFR has been calculated using the CKD EPI equation.     This calculation has not been validated in all clinical situations.     eGFR's persistently <90 mL/min signify possible Chronic Kidney     Disease.  CBC     Status: Abnormal   Collection Time    04/27/13  5:35 AM      Result Value Ref Range   WBC 8.6   4.0 - 10.5 K/uL   RBC 2.16 (*) 4.22 - 5.81 MIL/uL   Hemoglobin 7.2 (*) 13.0 - 17.0 g/dL   HCT 20.5 (*) 39.0 - 52.0 %   MCV 94.9  78.0 - 100.0 fL   MCH 33.3  26.0 - 34.0 pg   MCHC 35.1  30.0 - 36.0 g/dL   RDW 16.0 (*) 11.5 - 15.5 %  Platelets 150  150 - 400 K/uL  PREPARE RBC (CROSSMATCH)     Status: None   Collection Time    04/27/13  7:28 AM      Result Value Ref Range   Order Confirmation ORDER PROCESSED BY BLOOD BANK    GLUCOSE, CAPILLARY     Status: Abnormal   Collection Time    04/27/13  7:42 AM      Result Value Ref Range   Glucose-Capillary 131 (*) 70 - 99 mg/dL   Comment 1 Documented in Chart     Comment 2 Notify RN    GLUCOSE, CAPILLARY     Status: Abnormal   Collection Time    04/27/13 11:38 AM      Result Value Ref Range   Glucose-Capillary 129 (*) 70 - 99 mg/dL   Comment 1 Documented in Chart     Comment 2 Notify RN    HEMOGLOBIN AND HEMATOCRIT, BLOOD     Status: Abnormal   Collection Time    04/27/13  2:00 PM      Result Value Ref Range   Hemoglobin 8.0 (*) 13.0 - 17.0 g/dL   HCT 22.8 (*) 39.0 - 52.0 %  MRSA PCR SCREENING     Status: None   Collection Time    04/27/13  2:38 PM      Result Value Ref Range   MRSA by PCR NEGATIVE  NEGATIVE   Comment:            The GeneXpert MRSA Assay (FDA     approved for NASAL specimens     only), is one component of a     comprehensive MRSA colonization     surveillance program. It is not     intended to diagnose MRSA     infection nor to guide or     monitor treatment for     MRSA infections.  GLUCOSE, CAPILLARY     Status: Abnormal   Collection Time    04/27/13  3:43 PM      Result Value Ref Range   Glucose-Capillary 128 (*) 70 - 99 mg/dL  APTT     Status: None   Collection Time    04/27/13  4:00 PM      Result Value Ref Range   aPTT 30  24 - 37 seconds  PROTIME-INR     Status: Abnormal   Collection Time    04/27/13  4:00 PM      Result Value Ref Range   Prothrombin Time 15.3 (*) 11.6 - 15.2 seconds    INR 1.24  0.00 - 1.49  CBC     Status: Abnormal   Collection Time    04/27/13  4:00 PM      Result Value Ref Range   WBC 9.6  4.0 - 10.5 K/uL   RBC 2.43 (*) 4.22 - 5.81 MIL/uL   Hemoglobin 8.0 (*) 13.0 - 17.0 g/dL   HCT 22.3 (*) 39.0 - 52.0 %   MCV 91.8  78.0 - 100.0 fL   MCH 32.9  26.0 - 34.0 pg   MCHC 35.9  30.0 - 36.0 g/dL   RDW 16.9 (*) 11.5 - 15.5 %   Platelets 151  150 - 400 K/uL  LACTIC ACID, PLASMA     Status: None   Collection Time    04/27/13  4:15 PM      Result Value Ref Range   Lactic Acid, Venous 1.7  0.5 - 2.2 mmol/L  CBC     Status: Abnormal   Collection Time    04/27/13  5:15 PM      Result Value Ref Range   WBC 10.4  4.0 - 10.5 K/uL   RBC 2.35 (*) 4.22 - 5.81 MIL/uL   Hemoglobin 7.6 (*) 13.0 - 17.0 g/dL   HCT 21.6 (*) 39.0 - 52.0 %   MCV 91.9  78.0 - 100.0 fL   MCH 32.3  26.0 - 34.0 pg   MCHC 35.2  30.0 - 36.0 g/dL   RDW 17.5 (*) 11.5 - 15.5 %   Platelets 161  150 - 400 K/uL  PREPARE RBC (CROSSMATCH)     Status: None   Collection Time    04/27/13  7:30 PM      Result Value Ref Range   Order Confirmation ORDER PROCESSED BY BLOOD BANK    GLUCOSE, CAPILLARY     Status: Abnormal   Collection Time    04/27/13  8:56 PM      Result Value Ref Range   Glucose-Capillary 151 (*) 70 - 99 mg/dL  PREPARE RBC (CROSSMATCH)     Status: None   Collection Time    04/27/13 11:00 PM      Result Value Ref Range   Order Confirmation ORDER PROCESSED BY BLOOD BANK    GLUCOSE, CAPILLARY     Status: Abnormal   Collection Time    04/28/13 12:40 AM      Result Value Ref Range   Glucose-Capillary 153 (*) 70 - 99 mg/dL  CBC     Status: Abnormal   Collection Time    04/28/13  1:57 AM      Result Value Ref Range   WBC 11.6 (*) 4.0 - 10.5 K/uL   RBC 2.86 (*) 4.22 - 5.81 MIL/uL   Hemoglobin 9.0 (*) 13.0 - 17.0 g/dL   HCT 25.3 (*) 39.0 - 52.0 %   MCV 88.5  78.0 - 100.0 fL   MCH 31.5  26.0 - 34.0 pg   MCHC 35.6  30.0 - 36.0 g/dL   RDW 17.5 (*) 11.5 - 15.5 %   Platelets  136 (*) 150 - 400 K/uL  BASIC METABOLIC PANEL     Status: Abnormal   Collection Time    04/28/13  1:57 AM      Result Value Ref Range   Sodium 146  137 - 147 mEq/L   Potassium 4.6  3.7 - 5.3 mEq/L   Chloride 115 (*) 96 - 112 mEq/L   CO2 16 (*) 19 - 32 mEq/L   Glucose, Bld 159 (*) 70 - 99 mg/dL   BUN 62 (*) 6 - 23 mg/dL   Creatinine, Ser 1.00  0.50 - 1.35 mg/dL   Calcium 7.0 (*) 8.4 - 10.5 mg/dL   GFR calc non Af Amer 65 (*) >90 mL/min   GFR calc Af Amer 75 (*) >90 mL/min   Comment: (NOTE)     The eGFR has been calculated using the CKD EPI equation.     This calculation has not been validated in all clinical situations.     eGFR's persistently <90 mL/min signify possible Chronic Kidney     Disease.  LACTIC ACID, PLASMA     Status: None   Collection Time    04/28/13  1:58 AM      Result Value Ref Range   Lactic Acid, Venous 1.4  0.5 - 2.2 mmol/L  POCT I-STAT 3, BLOOD GAS (G3+)  Status: Abnormal   Collection Time    04/28/13  3:57 AM      Result Value Ref Range   pH, Arterial 7.396  7.350 - 7.450   pCO2 arterial 25.4 (*) 35.0 - 45.0 mmHg   pO2, Arterial 112.0 (*) 80.0 - 100.0 mmHg   Bicarbonate 15.6 (*) 20.0 - 24.0 mEq/L   TCO2 16  0 - 100 mmol/L   O2 Saturation 98.0     Acid-base deficit 8.0 (*) 0.0 - 2.0 mmol/L   Patient temperature 98.6 F     Collection site RADIAL, ALLEN'S TEST ACCEPTABLE     Drawn by VENIPUNCTURE     Sample type ARTERIAL    GLUCOSE, CAPILLARY     Status: Abnormal   Collection Time    04/28/13  4:40 AM      Result Value Ref Range   Glucose-Capillary 109 (*) 70 - 99 mg/dL  CBC     Status: Abnormal   Collection Time    04/28/13  6:04 AM      Result Value Ref Range   WBC 8.9  4.0 - 10.5 K/uL   RBC 2.53 (*) 4.22 - 5.81 MIL/uL   Hemoglobin 8.1 (*) 13.0 - 17.0 g/dL   HCT 22.2 (*) 39.0 - 52.0 %   MCV 87.7  78.0 - 100.0 fL   MCH 32.0  26.0 - 34.0 pg   MCHC 36.5 (*) 30.0 - 36.0 g/dL   RDW 17.7 (*) 11.5 - 15.5 %   Platelets 113 (*) 150 - 400 K/uL    Comment: REPEATED TO VERIFY     PLATELET COUNT CONFIRMED BY SMEAR  GLUCOSE, CAPILLARY     Status: Abnormal   Collection Time    04/28/13  7:41 AM      Result Value Ref Range   Glucose-Capillary 132 (*) 70 - 99 mg/dL  TROPONIN I     Status: Abnormal   Collection Time    04/28/13 10:46 AM      Result Value Ref Range   Troponin I 15.27 (*) <0.30 ng/mL   Comment:            Due to the release kinetics of cTnI,     a negative result within the first hours     of the onset of symptoms does not rule out     myocardial infarction with certainty.     If myocardial infarction is still suspected,     repeat the test at appropriate intervals.     CRITICAL VALUE NOTED.  VALUE IS CONSISTENT WITH PREVIOUSLY REPORTED AND CALLED VALUE.  GLUCOSE, CAPILLARY     Status: Abnormal   Collection Time    04/28/13  1:03 PM      Result Value Ref Range   Glucose-Capillary 64 (*) 70 - 99 mg/dL  PREPARE RBC (CROSSMATCH)     Status: None   Collection Time    04/28/13  1:42 PM      Result Value Ref Range   Order Confirmation ORDER PROCESSED BY BLOOD BANK    CBC     Status: Abnormal   Collection Time    04/28/13  1:50 PM      Result Value Ref Range   WBC 7.1  4.0 - 10.5 K/uL   RBC 1.96 (*) 4.22 - 5.81 MIL/uL   Hemoglobin 6.2 (*) 13.0 - 17.0 g/dL   Comment: REPEATED TO VERIFY     CRITICAL RESULT CALLED TO, READ BACK BY AND VERIFIED WITH:  REVIS, D RN [id number] 04/28/2013 BY MACEDA, J   HCT 17.3 (*) 39.0 - 52.0 %   MCV 88.3  78.0 - 100.0 fL   MCH 31.6  26.0 - 34.0 pg   MCHC 35.8  30.0 - 36.0 g/dL   RDW 18.0 (*) 11.5 - 15.5 %   Platelets 95 (*) 150 - 400 K/uL   Comment: REPEATED TO VERIFY   Nm Gi Blood Loss  04/28/2013   CLINICAL DATA Hematochezia  EXAM NUCLEAR MEDICINE GASTROINTESTINAL BLEEDING SCAN  TECHNIQUE Sequential abdominal images were obtained following intravenous administration of Tc-84mlabeled red blood cells.  RADIOPHARMACEUTICALS 25 MILLI CURIE ULTRATAG TECHNETIUM TC 13M-LABELED RED BLOOD  CELLS IV KIT  COMPARISON None  FINDINGS Abnormal red cell localization is identified in the right upper quadrant beginning at 31 min.  This abnormal tracer continues through multiple loops of bowel in the mid abdomen and right upper quadrant.  This probably represents a duodenal or proximal jejunal bleeding source.  No tracer is seen along the expected course of the colon.  IMPRESSION Abnormal GI bleeding exam demonstrating what appears to be small bowel localization of tracer in the upper mid abdomen in right upper quadrant, question duodenal versus proximal jejunal origin.  No definite colonic localization of labeled erythrocytes is identified.  SIGNATURE  Electronically Signed   By: MLavonia DanaM.D.   On: 04/28/2013 13:14   Dg Chest Port 1 View  04/27/2013   CLINICAL DATA A central line.  GI bleed both wall  EXAM PORTABLE CHEST - 1 VIEW  COMPARISON 10/13/2009  FINDINGS Examination is limited by rightward rotation. This is the best obtainable exam due to patient condition.  Right IJ catheter, tip at the mid to upper SVC. No pneumothorax or hemothorax. Cardiomegaly, comparison with prior limited due to above. Interstitial coarsening with interstitial crowding from hypoaeration.  IMPRESSION Right IJ catheter without adverse features.  SIGNATURE  Electronically Signed   By: JJorje GuildM.D.   On: 04/27/2013 15:56       Assessment/Plan 1. GI bleed, ? Fast upper vs lower 2. NSTEMI Patient Active Problem List   Diagnosis Date Noted  . Hemorrhage of gastrointestinal tract, unspecified 04/27/2013  . Hemorrhagic shock 04/27/2013  . Upper GI bleed 04/27/2013  . NSTEMI (non-ST elevated myocardial infarction) 04/27/2013  . Syncope 04/25/2013  . Orbital fracture 04/25/2013  . Anemia 04/25/2013  . Hyperlipidemia   . Hypertension   . Arthritis   . Prediabetes   . Vitamin D deficiency   . BPH (benign prostatic hyperplasia)    Plan: 1. Patient has expressed to me that he knows he's going to a better  place and misses his wife.  He told me that he is not afraid to die.  He told Dr. BNinfa Lindenthat after discussion, he would not want a big surgery if his bleeding is unable to be stopped by either GI or IR.  I also stressed to the patient, he needs to communicate with his son his wishes as the son has expressed to do everything for the patient, but I'm not sure the patient, himself, feels the same way. 2. Little to offer at this point as patient has clearly stated he does not want an operation.    Zayli Villafuerte E 04/28/2013, 2:59 PM Pager: [phone]

## 2013-04-28 NOTE — Progress Notes (Signed)
Daily Rounding Note  04/28/2013, 10:13 AM  LOS: 3 days   SUBJECTIVE:       Still large bloody BMs overnight.  No abdominal pain.  In Nuc med for tagged cell scan.   Off levophed Transfused 4 units thus far, latest started 0100 this AM.  More alert per RN.   OBJECTIVE:         Vital signs in last 24 hours:    Temp:  [97.5 F (36.4 C)-99 F (37.2 C)] 97.5 F (36.4 C) (03/11 0800) Pulse Rate:  [34-110] 74 (03/11 0800) Resp:  [15-25] 16 (03/11 0100) BP: (62-120)/(21-90) 108/47 mmHg (03/10 2110) SpO2:  [83 %-100 %] 100 % (03/11 0800) Arterial Line BP: (104-149)/(28-47) 111/28 mmHg (03/11 0800) Last BM Date: 04/28/13  Pt off floor did not examine  Intake/Output from previous day: 03/10 0701 - 03/11 0700 In: 2712.7 [I.V.:602.7; Blood:1110; IV Piggyback:1000] Out: 580 [Urine:580]  Intake/Output this shift: Total I/O In: 4.7 [I.V.:4.7] Out: -   Lab Results:  Recent Labs  04/27/13 1715 04/28/13 0157 04/28/13 0604  WBC 10.4 11.6* 8.9  HGB 7.6* 9.0* 8.1*  HCT 21.6* 25.3* 22.2*  PLT 161 136* 113*   BMET  Recent Labs  04/26/13 0125 04/27/13 0535 04/28/13 0157  NA 142 144 146  K 3.8 3.7 4.6  CL 107 110 115*  CO2 22 18* 16*  GLUCOSE 165* 151* 159*  BUN 36* 53* 62*  CREATININE 0.87 0.90 1.00  CALCIUM 8.6 8.3* 7.0*   LFT  Recent Labs  04/26/13 0125  PROT 5.1*  ALBUMIN 2.7*  AST 18  ALT 10  ALKPHOS 47  BILITOT 0.3   PT/INR  Recent Labs  04/27/13 1600  LABPROT 15.3*  INR 1.24   Hepatitis Panel No results found for this basename: HEPBSAG, HCVAB, HEPAIGM, HEPBIGM,  in the last 72 hours  Studies/Results: Dg Chest Port 1 View  04/27/2013   CLINICAL DATA A central line.  GI bleed both wall  EXAM PORTABLE CHEST - 1 VIEW  COMPARISON 10/13/2009  FINDINGS Examination is limited by rightward rotation. This is the best obtainable exam due to patient condition.  Right IJ catheter, tip at the mid to  upper SVC. No pneumothorax or hemothorax. Cardiomegaly, comparison with prior limited due to above. Interstitial coarsening with interstitial crowding from hypoaeration.  IMPRESSION Right IJ catheter without adverse features.  SIGNATURE  Electronically Signed   By: Jorje Guild M.D.   On: 04/27/2013 15:56    ASSESMENT:   *  GIB.  Maroon stool persistent. EGD 3/10 with small HH, no lesions and no bleeding Stabilized off pressors overnight.  Nuc med study 3/11 in progress.  ? Diverticular bleed. No previous colonoscopy.   *  ABL anemia.    *  Syncope, fell, orbital fracture on right.   *  Non STEMI, likely due to demand ischemia in setting of hypovolemic/blood loss shock and anemia. EF normal on 2d Echo.  State of coronaries unknown but no prior cardiac issues except htn.     PLAN   *  Await results of RBC scan. *  Stop IV Protonix.     Philip Simpson  04/28/2013, 10:13 AM Pager: [phone] Attending MD note:   I have taken a history, examined the patient, and reviewed the chart. I agree with the Advanced Practitioner's impression and recommendations. Nuclear medicine scan is positive in proximal jejunum- ?? Avm? Will proceed with small bowl enteroscopy  This afternoon.  Melburn Popper [facility] Pager # [phone]

## 2013-04-28 NOTE — Consult Note (Signed)
HPI: Philip Simpson is an 80 y.o. male with GI bleed. Source undetermined at present time. Has had Upper endoscopy, no findings. NM RBC scan finds mid abd and RUQ activity, suggesting SB source, but not definitive. Had large melanotic stool at ~1:00pm per RN, and a smaller bloody BM at 2:30p Currently receiving PRBC infusion. Plans for repeat endoscopy/enteroscopy this afternoon. IR asked to be available for mesenteric arteriogram and possible embolization. GI and surgical consults noted. Chart, PMHX, labs, meds reviewed.    Past Medical History:  Past Medical History  Diagnosis Date  . Hyperlipidemia   . Hypertension   . Arthritis   . Bladder cancer 2003  . Prediabetes   . Vitamin D deficiency   . BPH (benign prostatic hyperplasia)   . Skin cancer 2013    sq cell on scalp, basal cell on chest    Past Surgical History:  Past Surgical History  Procedure Laterality Date  . Cysto  2003  . Tonsillectomy    . Cataract extraction Right   . Esophagogastroduodenoscopy N/A 04/27/2013    Procedure: ESOPHAGOGASTRODUODENOSCOPY (EGD);  Surgeon: Lafayette Dragon, MD;  Location: [facility] ENDOSCOPY;  Service: Endoscopy;  Laterality: N/A;    Family History:  Family History  Problem Relation Age of Onset  . Alzheimer's disease Mother   . Heart disease Mother   . Heart disease Father     Social History:  reports that he has never smoked. He has never used smokeless tobacco. He reports that he does not drink alcohol or use illicit drugs.  Allergies:  Allergies  Allergen Reactions  . Baycol [Cerivastatin]   . Zocor [Simvastatin]     Medications:   Please HPI for pertinent positives, otherwise complete 10 system ROS negative.  Physical Exam: BP 108/47  Pulse 61  Temp(Src) 98.4 F (36.9 C) (Oral)  Resp 16  Ht 5' 7"  (1.702 m)  Wt 174 lb 14.4 oz (79.334 kg)  BMI 27.39 kg/m2  SpO2 100% Body mass index is 27.39 kg/(m^2).   General Appearance:  Alert, cooperative, no distress, appears  stated age  Head:  Normocephalic, without obvious abnormality, atraumatic  ENT: Unremarkable airway  Lungs:   Coarse BS bilat  Heart:  Regular rate and rhythm, S1, S2 normal, no murmur, rub or gallop.  Abdomen:   Soft, non-tender, non distended.  Extremities: Extremities normal, atraumatic, no cyanosis or edema  Pulses: 1+ and symmetric femoral  Neurologic: Normal affect, no gross deficits.   Results for orders placed during the hospital encounter of 04/25/13 (from the past 48 hour(s))  GLUCOSE, CAPILLARY     Status: Abnormal   Collection Time    04/26/13  4:42 PM      Result Value Ref Range   Glucose-Capillary 131 (*) 70 - 99 mg/dL  GLUCOSE, CAPILLARY     Status: Abnormal   Collection Time    04/26/13  7:48 PM      Result Value Ref Range   Glucose-Capillary 146 (*) 70 - 99 mg/dL   Comment 1 Notify RN    OCCULT BLOOD X 1 CARD TO LAB, STOOL     Status: Abnormal   Collection Time    04/26/13  9:10 PM      Result Value Ref Range   Fecal Occult Bld POSITIVE (*) NEGATIVE  TROPONIN I     Status: Abnormal   Collection Time    04/26/13  9:50 PM      Result Value Ref Range   Troponin I 2.79 (*) <  0.30 ng/mL   Comment:            Due to the release kinetics of cTnI,     a negative result within the first hours     of the onset of symptoms does not rule out     myocardial infarction with certainty.     If myocardial infarction is still suspected,     repeat the test at appropriate intervals.     CRITICAL VALUE NOTED.  VALUE IS CONSISTENT WITH PREVIOUSLY REPORTED AND CALLED VALUE.  HEMOGLOBIN AND HEMATOCRIT, BLOOD     Status: Abnormal   Collection Time    04/26/13  9:50 PM      Result Value Ref Range   Hemoglobin 6.2 (*) 13.0 - 17.0 g/dL   Comment: REPEATED TO VERIFY     CRITICAL RESULT CALLED TO, READ BACK BY AND VERIFIED WITHLaury Deep RN [id number] GREEN R   HCT 17.7 (*) 39.0 - 52.0 %  TYPE AND SCREEN     Status: None   Collection Time    04/26/13  9:50 PM      Result  Value Ref Range   ABO/RH(D) B NEG     Antibody Screen NEG     Sample Expiration 04/29/2013     Unit Number [id number]     Blood Component Type RED CELLS,LR     Unit division 00     Status of Unit ISSUED,FINAL     Transfusion Status OK TO TRANSFUSE     Crossmatch Result Compatible     Unit Number [id number]     Blood Component Type RED CELLS,LR     Unit division 00     Status of Unit ISSUED,FINAL     Transfusion Status OK TO TRANSFUSE     Crossmatch Result Compatible     Unit Number [id number]     Blood Component Type RED CELLS,LR     Unit division 00     Status of Unit ISSUED,FINAL     Transfusion Status OK TO TRANSFUSE     Crossmatch Result Compatible     Unit Number [id number]     Blood Component Type RED CELLS,LR     Unit division 00     Status of Unit ISSUED,FINAL     Transfusion Status OK TO TRANSFUSE     Crossmatch Result Compatible     Unit Number [id number]     Blood Component Type RED CELLS,LR     Unit division 00     Status of Unit ALLOCATED     Transfusion Status OK TO TRANSFUSE     Crossmatch Result Compatible     Unit Number [id number]     Blood Component Type RED CELLS,LR     Unit division 00     Status of Unit ISSUED     Transfusion Status OK TO TRANSFUSE     Crossmatch Result Compatible    PREPARE RBC (CROSSMATCH)     Status: None   Collection Time    04/26/13  9:50 PM      Result Value Ref Range   Order Confirmation ORDER PROCESSED BY BLOOD BANK    CK     Status: Abnormal   Collection Time    04/26/13  9:50 PM      Result Value Ref Range   Total CK 853 (*) 7 - 232 U/L  ABO/RH     Status: None   Collection Time  04/26/13  9:50 PM      Result Value Ref Range   ABO/RH(D) B NEG    BASIC METABOLIC PANEL     Status: Abnormal   Collection Time    04/27/13  5:35 AM      Result Value Ref Range   Sodium 144  137 - 147 mEq/L   Potassium 3.7  3.7 - 5.3 mEq/L   Chloride 110  96 - 112 mEq/L   CO2 18 (*) 19 - 32 mEq/L   Glucose, Bld  151 (*) 70 - 99 mg/dL   BUN 53 (*) 6 - 23 mg/dL   Creatinine, Ser 0.90  0.50 - 1.35 mg/dL   Calcium 8.3 (*) 8.4 - 10.5 mg/dL   GFR calc non Af Amer 74 (*) >90 mL/min   GFR calc Af Amer 86 (*) >90 mL/min   Comment: (NOTE)     The eGFR has been calculated using the CKD EPI equation.     This calculation has not been validated in all clinical situations.     eGFR's persistently <90 mL/min signify possible Chronic Kidney     Disease.  CBC     Status: Abnormal   Collection Time    04/27/13  5:35 AM      Result Value Ref Range   WBC 8.6  4.0 - 10.5 K/uL   RBC 2.16 (*) 4.22 - 5.81 MIL/uL   Hemoglobin 7.2 (*) 13.0 - 17.0 g/dL   HCT 20.5 (*) 39.0 - 52.0 %   MCV 94.9  78.0 - 100.0 fL   MCH 33.3  26.0 - 34.0 pg   MCHC 35.1  30.0 - 36.0 g/dL   RDW 16.0 (*) 11.5 - 15.5 %   Platelets 150  150 - 400 K/uL  PREPARE RBC (CROSSMATCH)     Status: None   Collection Time    04/27/13  7:28 AM      Result Value Ref Range   Order Confirmation ORDER PROCESSED BY BLOOD BANK    GLUCOSE, CAPILLARY     Status: Abnormal   Collection Time    04/27/13  7:42 AM      Result Value Ref Range   Glucose-Capillary 131 (*) 70 - 99 mg/dL   Comment 1 Documented in Chart     Comment 2 Notify RN    GLUCOSE, CAPILLARY     Status: Abnormal   Collection Time    04/27/13 11:38 AM      Result Value Ref Range   Glucose-Capillary 129 (*) 70 - 99 mg/dL   Comment 1 Documented in Chart     Comment 2 Notify RN    HEMOGLOBIN AND HEMATOCRIT, BLOOD     Status: Abnormal   Collection Time    04/27/13  2:00 PM      Result Value Ref Range   Hemoglobin 8.0 (*) 13.0 - 17.0 g/dL   HCT 22.8 (*) 39.0 - 52.0 %  MRSA PCR SCREENING     Status: None   Collection Time    04/27/13  2:38 PM      Result Value Ref Range   MRSA by PCR NEGATIVE  NEGATIVE   Comment:            The GeneXpert MRSA Assay (FDA     approved for NASAL specimens     only), is one component of a     comprehensive MRSA colonization     surveillance program. It  is not     intended  to diagnose MRSA     infection nor to guide or     monitor treatment for     MRSA infections.  GLUCOSE, CAPILLARY     Status: Abnormal   Collection Time    04/27/13  3:43 PM      Result Value Ref Range   Glucose-Capillary 128 (*) 70 - 99 mg/dL  APTT     Status: None   Collection Time    04/27/13  4:00 PM      Result Value Ref Range   aPTT 30  24 - 37 seconds  PROTIME-INR     Status: Abnormal   Collection Time    04/27/13  4:00 PM      Result Value Ref Range   Prothrombin Time 15.3 (*) 11.6 - 15.2 seconds   INR 1.24  0.00 - 1.49  CBC     Status: Abnormal   Collection Time    04/27/13  4:00 PM      Result Value Ref Range   WBC 9.6  4.0 - 10.5 K/uL   RBC 2.43 (*) 4.22 - 5.81 MIL/uL   Hemoglobin 8.0 (*) 13.0 - 17.0 g/dL   HCT 22.3 (*) 39.0 - 52.0 %   MCV 91.8  78.0 - 100.0 fL   MCH 32.9  26.0 - 34.0 pg   MCHC 35.9  30.0 - 36.0 g/dL   RDW 16.9 (*) 11.5 - 15.5 %   Platelets 151  150 - 400 K/uL  LACTIC ACID, PLASMA     Status: None   Collection Time    04/27/13  4:15 PM      Result Value Ref Range   Lactic Acid, Venous 1.7  0.5 - 2.2 mmol/L  CBC     Status: Abnormal   Collection Time    04/27/13  5:15 PM      Result Value Ref Range   WBC 10.4  4.0 - 10.5 K/uL   RBC 2.35 (*) 4.22 - 5.81 MIL/uL   Hemoglobin 7.6 (*) 13.0 - 17.0 g/dL   HCT 21.6 (*) 39.0 - 52.0 %   MCV 91.9  78.0 - 100.0 fL   MCH 32.3  26.0 - 34.0 pg   MCHC 35.2  30.0 - 36.0 g/dL   RDW 17.5 (*) 11.5 - 15.5 %   Platelets 161  150 - 400 K/uL  PREPARE RBC (CROSSMATCH)     Status: None   Collection Time    04/27/13  7:30 PM      Result Value Ref Range   Order Confirmation ORDER PROCESSED BY BLOOD BANK    GLUCOSE, CAPILLARY     Status: Abnormal   Collection Time    04/27/13  8:56 PM      Result Value Ref Range   Glucose-Capillary 151 (*) 70 - 99 mg/dL  PREPARE RBC (CROSSMATCH)     Status: None   Collection Time    04/27/13 11:00 PM      Result Value Ref Range   Order Confirmation  ORDER PROCESSED BY BLOOD BANK    GLUCOSE, CAPILLARY     Status: Abnormal   Collection Time    04/28/13 12:40 AM      Result Value Ref Range   Glucose-Capillary 153 (*) 70 - 99 mg/dL  CBC     Status: Abnormal   Collection Time    04/28/13  1:57 AM      Result Value Ref Range   WBC 11.6 (*) 4.0 - 10.5 K/uL  RBC 2.86 (*) 4.22 - 5.81 MIL/uL   Hemoglobin 9.0 (*) 13.0 - 17.0 g/dL   HCT 25.3 (*) 39.0 - 52.0 %   MCV 88.5  78.0 - 100.0 fL   MCH 31.5  26.0 - 34.0 pg   MCHC 35.6  30.0 - 36.0 g/dL   RDW 17.5 (*) 11.5 - 15.5 %   Platelets 136 (*) 150 - 400 K/uL  BASIC METABOLIC PANEL     Status: Abnormal   Collection Time    04/28/13  1:57 AM      Result Value Ref Range   Sodium 146  137 - 147 mEq/L   Potassium 4.6  3.7 - 5.3 mEq/L   Chloride 115 (*) 96 - 112 mEq/L   CO2 16 (*) 19 - 32 mEq/L   Glucose, Bld 159 (*) 70 - 99 mg/dL   BUN 62 (*) 6 - 23 mg/dL   Creatinine, Ser 1.00  0.50 - 1.35 mg/dL   Calcium 7.0 (*) 8.4 - 10.5 mg/dL   GFR calc non Af Amer 65 (*) >90 mL/min   GFR calc Af Amer 75 (*) >90 mL/min   Comment: (NOTE)     The eGFR has been calculated using the CKD EPI equation.     This calculation has not been validated in all clinical situations.     eGFR's persistently <90 mL/min signify possible Chronic Kidney     Disease.  LACTIC ACID, PLASMA     Status: None   Collection Time    04/28/13  1:58 AM      Result Value Ref Range   Lactic Acid, Venous 1.4  0.5 - 2.2 mmol/L  POCT I-STAT 3, BLOOD GAS (G3+)     Status: Abnormal   Collection Time    04/28/13  3:57 AM      Result Value Ref Range   pH, Arterial 7.396  7.350 - 7.450   pCO2 arterial 25.4 (*) 35.0 - 45.0 mmHg   pO2, Arterial 112.0 (*) 80.0 - 100.0 mmHg   Bicarbonate 15.6 (*) 20.0 - 24.0 mEq/L   TCO2 16  0 - 100 mmol/L   O2 Saturation 98.0     Acid-base deficit 8.0 (*) 0.0 - 2.0 mmol/L   Patient temperature 98.6 F     Collection site RADIAL, ALLEN'S TEST ACCEPTABLE     Drawn by VENIPUNCTURE     Sample type  ARTERIAL    GLUCOSE, CAPILLARY     Status: Abnormal   Collection Time    04/28/13  4:40 AM      Result Value Ref Range   Glucose-Capillary 109 (*) 70 - 99 mg/dL  CBC     Status: Abnormal   Collection Time    04/28/13  6:04 AM      Result Value Ref Range   WBC 8.9  4.0 - 10.5 K/uL   RBC 2.53 (*) 4.22 - 5.81 MIL/uL   Hemoglobin 8.1 (*) 13.0 - 17.0 g/dL   HCT 22.2 (*) 39.0 - 52.0 %   MCV 87.7  78.0 - 100.0 fL   MCH 32.0  26.0 - 34.0 pg   MCHC 36.5 (*) 30.0 - 36.0 g/dL   RDW 17.7 (*) 11.5 - 15.5 %   Platelets 113 (*) 150 - 400 K/uL   Comment: REPEATED TO VERIFY     PLATELET COUNT CONFIRMED BY SMEAR  GLUCOSE, CAPILLARY     Status: Abnormal   Collection Time    04/28/13  7:41 AM  Result Value Ref Range   Glucose-Capillary 132 (*) 70 - 99 mg/dL  TROPONIN I     Status: Abnormal   Collection Time    04/28/13 10:46 AM      Result Value Ref Range   Troponin I 15.27 (*) <0.30 ng/mL   Comment:            Due to the release kinetics of cTnI,     a negative result within the first hours     of the onset of symptoms does not rule out     myocardial infarction with certainty.     If myocardial infarction is still suspected,     repeat the test at appropriate intervals.     CRITICAL VALUE NOTED.  VALUE IS CONSISTENT WITH PREVIOUSLY REPORTED AND CALLED VALUE.  GLUCOSE, CAPILLARY     Status: Abnormal   Collection Time    04/28/13  1:03 PM      Result Value Ref Range   Glucose-Capillary 64 (*) 70 - 99 mg/dL  PREPARE RBC (CROSSMATCH)     Status: None   Collection Time    04/28/13  1:42 PM      Result Value Ref Range   Order Confirmation ORDER PROCESSED BY BLOOD BANK    CBC     Status: Abnormal   Collection Time    04/28/13  1:50 PM      Result Value Ref Range   WBC 7.1  4.0 - 10.5 K/uL   RBC 1.96 (*) 4.22 - 5.81 MIL/uL   Hemoglobin 6.2 (*) 13.0 - 17.0 g/dL   Comment: REPEATED TO VERIFY     CRITICAL RESULT CALLED TO, READ BACK BY AND VERIFIED WITH:     REVIS, D RN [id number]  04/28/2013 BY MACEDA, J   HCT 17.3 (*) 39.0 - 52.0 %   MCV 88.3  78.0 - 100.0 fL   MCH 31.6  26.0 - 34.0 pg   MCHC 35.8  30.0 - 36.0 g/dL   RDW 18.0 (*) 11.5 - 15.5 %   Platelets 95 (*) 150 - 400 K/uL   Comment: REPEATED TO VERIFY   Nm Gi Blood Loss  04/28/2013   CLINICAL DATA Hematochezia  EXAM NUCLEAR MEDICINE GASTROINTESTINAL BLEEDING SCAN  TECHNIQUE Sequential abdominal images were obtained following intravenous administration of Tc-98mlabeled red blood cells.  RADIOPHARMACEUTICALS 25 MILLI CURIE ULTRATAG TECHNETIUM TC 12M-LABELED RED BLOOD CELLS IV KIT  COMPARISON None  FINDINGS Abnormal red cell localization is identified in the right upper quadrant beginning at 31 min.  This abnormal tracer continues through multiple loops of bowel in the mid abdomen and right upper quadrant.  This probably represents a duodenal or proximal jejunal bleeding source.  No tracer is seen along the expected course of the colon.  IMPRESSION Abnormal GI bleeding exam demonstrating what appears to be small bowel localization of tracer in the upper mid abdomen in right upper quadrant, question duodenal versus proximal jejunal origin.  No definite colonic localization of labeled erythrocytes is identified.  SIGNATURE  Electronically Signed   By: MLavonia DanaM.D.   On: 04/28/2013 13:14   Dg Chest Port 1 View  04/27/2013   CLINICAL DATA A central line.  GI bleed both wall  EXAM PORTABLE CHEST - 1 VIEW  COMPARISON 10/13/2009  FINDINGS Examination is limited by rightward rotation. This is the best obtainable exam due to patient condition.  Right IJ catheter, tip at the mid to upper SVC. No pneumothorax or hemothorax. Cardiomegaly,  comparison with prior limited due to above. Interstitial coarsening with interstitial crowding from hypoaeration.  IMPRESSION Right IJ catheter without adverse features.  SIGNATURE  Electronically Signed   By: Jorje Guild M.D.   On: 04/27/2013 15:56    Assessment/Plan Gi bleed, source  unknown, suspect upper. Discussed angiogram and possible embo procedure with pt and with son, Louie Casa, over phone. Pt agreeable to proceed if needed since [place] approves. Explained risks, complications, use of sedation. Explained possibility that we may not be able to localize and/or achieve hemostasis. Labs reviewed. Consent signed in chart  Ascencion Dike PA-C 04/28/2013, 3:55 PM

## 2013-04-28 NOTE — Consult Note (Signed)
Agree.  Dr. Olevia Perches to perform EGD and enteroscopy.  Will be available for arteriography if bleeding not controlled by endoscopic procedure.  Dr. Annamaria Boots on call tonight.

## 2013-04-28 NOTE — Sedation Documentation (Signed)
Levophed increased to 30mcg/min

## 2013-04-28 NOTE — Sedation Documentation (Signed)
70 mcgs of nitroglycerine given in mesenteric artery by Dr Annamaria Boots

## 2013-04-28 NOTE — Sedation Documentation (Signed)
Short runs of Afib noted

## 2013-04-28 NOTE — Sedation Documentation (Signed)
Saline flush at 125/hr

## 2013-04-28 NOTE — Sedation Documentation (Signed)
Rechecking bp 

## 2013-04-28 NOTE — Sedation Documentation (Signed)
Emergent mesenteric angiogram for gi bleed.  PRBC's infusing.  ART line inaccurate per floor RN.  Pt alert.  Procedure explained, pt agreeable.

## 2013-04-29 ENCOUNTER — Encounter (HOSPITAL_COMMUNITY): Payer: Self-pay | Admitting: Internal Medicine

## 2013-04-29 DIAGNOSIS — E876 Hypokalemia: Secondary | ICD-10-CM

## 2013-04-29 DIAGNOSIS — I5030 Unspecified diastolic (congestive) heart failure: Secondary | ICD-10-CM | POA: Diagnosis present

## 2013-04-29 DIAGNOSIS — K5732 Diverticulitis of large intestine without perforation or abscess without bleeding: Secondary | ICD-10-CM | POA: Diagnosis present

## 2013-04-29 DIAGNOSIS — S0180XA Unspecified open wound of other part of head, initial encounter: Secondary | ICD-10-CM

## 2013-04-29 DIAGNOSIS — K5731 Diverticulosis of large intestine without perforation or abscess with bleeding: Secondary | ICD-10-CM | POA: Diagnosis present

## 2013-04-29 LAB — CBC
HCT: 21.4 % — ABNORMAL LOW (ref 39.0–52.0)
HCT: 25.2 % — ABNORMAL LOW (ref 39.0–52.0)
HCT: 27.6 % — ABNORMAL LOW (ref 39.0–52.0)
HEMOGLOBIN: 9.2 g/dL — AB (ref 13.0–17.0)
Hemoglobin: 7.8 g/dL — ABNORMAL LOW (ref 13.0–17.0)
Hemoglobin: 9.9 g/dL — ABNORMAL LOW (ref 13.0–17.0)
MCH: 31.8 pg (ref 26.0–34.0)
MCH: 32.1 pg (ref 26.0–34.0)
MCH: 32.3 pg (ref 26.0–34.0)
MCHC: 35.9 g/dL (ref 30.0–36.0)
MCHC: 36.4 g/dL — ABNORMAL HIGH (ref 30.0–36.0)
MCHC: 36.5 g/dL — AB (ref 30.0–36.0)
MCV: 88.1 fL (ref 78.0–100.0)
MCV: 88.4 fL (ref 78.0–100.0)
MCV: 88.7 fL (ref 78.0–100.0)
PLATELETS: 105 10*3/uL — AB (ref 150–400)
Platelets: 113 10*3/uL — ABNORMAL LOW (ref 150–400)
Platelets: 84 10*3/uL — ABNORMAL LOW (ref 150–400)
RBC: 2.43 MIL/uL — AB (ref 4.22–5.81)
RBC: 2.85 MIL/uL — ABNORMAL LOW (ref 4.22–5.81)
RBC: 3.11 MIL/uL — AB (ref 4.22–5.81)
RDW: 16.4 % — ABNORMAL HIGH (ref 11.5–15.5)
RDW: 16.5 % — ABNORMAL HIGH (ref 11.5–15.5)
RDW: 16.6 % — AB (ref 11.5–15.5)
WBC: 10.2 10*3/uL (ref 4.0–10.5)
WBC: 10.4 10*3/uL (ref 4.0–10.5)
WBC: 12 10*3/uL — ABNORMAL HIGH (ref 4.0–10.5)

## 2013-04-29 LAB — CBC WITH DIFFERENTIAL/PLATELET
BASOS PCT: 0 % (ref 0–1)
Basophils Absolute: 0 10*3/uL (ref 0.0–0.1)
EOS PCT: 0 % (ref 0–5)
Eosinophils Absolute: 0 10*3/uL (ref 0.0–0.7)
HEMATOCRIT: 27.1 % — AB (ref 39.0–52.0)
HEMOGLOBIN: 9.8 g/dL — AB (ref 13.0–17.0)
LYMPHS PCT: 13 % (ref 12–46)
Lymphs Abs: 1.4 10*3/uL (ref 0.7–4.0)
MCH: 31.9 pg (ref 26.0–34.0)
MCHC: 36.2 g/dL — ABNORMAL HIGH (ref 30.0–36.0)
MCV: 88.3 fL (ref 78.0–100.0)
MONOS PCT: 8 % (ref 3–12)
Monocytes Absolute: 0.9 10*3/uL (ref 0.1–1.0)
NEUTROS ABS: 8.7 10*3/uL — AB (ref 1.7–7.7)
Neutrophils Relative %: 79 % — ABNORMAL HIGH (ref 43–77)
Platelets: 108 10*3/uL — ABNORMAL LOW (ref 150–400)
RBC: 3.07 MIL/uL — AB (ref 4.22–5.81)
RDW: 16.5 % — AB (ref 11.5–15.5)
WBC: 11 10*3/uL — AB (ref 4.0–10.5)

## 2013-04-29 LAB — TYPE AND SCREEN
ABO/RH(D): B NEG
Antibody Screen: NEGATIVE
UNIT DIVISION: 0
UNIT DIVISION: 0
Unit division: 0
Unit division: 0
Unit division: 0
Unit division: 0
Unit division: 0
Unit division: 0

## 2013-04-29 LAB — MAGNESIUM: Magnesium: 1.7 mg/dL (ref 1.5–2.5)

## 2013-04-29 LAB — COMPREHENSIVE METABOLIC PANEL
ALT: 30 U/L (ref 0–53)
AST: 71 U/L — ABNORMAL HIGH (ref 0–37)
Albumin: 2.3 g/dL — ABNORMAL LOW (ref 3.5–5.2)
Alkaline Phosphatase: 35 U/L — ABNORMAL LOW (ref 39–117)
BUN: 48 mg/dL — AB (ref 6–23)
CO2: 14 mEq/L — ABNORMAL LOW (ref 19–32)
CREATININE: 0.99 mg/dL (ref 0.50–1.35)
Calcium: 7.5 mg/dL — ABNORMAL LOW (ref 8.4–10.5)
Chloride: 112 mEq/L (ref 96–112)
GFR calc non Af Amer: 71 mL/min — ABNORMAL LOW (ref >90)
GFR, EST AFRICAN AMERICAN: 82 mL/min — AB (ref >90)
GLUCOSE: 174 mg/dL — AB (ref 70–99)
Potassium: 3.4 mEq/L — ABNORMAL LOW (ref 3.7–5.3)
Sodium: 142 mEq/L (ref 137–147)
Total Bilirubin: 0.4 mg/dL (ref 0.3–1.2)
Total Protein: 4.1 g/dL — ABNORMAL LOW (ref 6.0–8.3)

## 2013-04-29 LAB — GLUCOSE, CAPILLARY
GLUCOSE-CAPILLARY: 173 mg/dL — AB (ref 70–99)
GLUCOSE-CAPILLARY: 173 mg/dL — AB (ref 70–99)
Glucose-Capillary: 135 mg/dL — ABNORMAL HIGH (ref 70–99)

## 2013-04-29 LAB — TROPONIN I
TROPONIN I: 19.26 ng/mL — AB (ref <0.30)
Troponin I: 14.25 ng/mL (ref <0.30)

## 2013-04-29 LAB — POTASSIUM: POTASSIUM: 3.3 meq/L — AB (ref 3.7–5.3)

## 2013-04-29 LAB — PRO B NATRIURETIC PEPTIDE: PRO B NATRI PEPTIDE: 4803 pg/mL — AB (ref 0–450)

## 2013-04-29 MED ORDER — POTASSIUM CHLORIDE 10 MEQ/100ML IV SOLN
10.0000 meq | INTRAVENOUS | Status: AC
  Administered 2013-04-29 (×2): 10 meq via INTRAVENOUS
  Filled 2013-04-29 (×2): qty 100

## 2013-04-29 MED ORDER — PANTOPRAZOLE SODIUM 40 MG PO TBEC
40.0000 mg | DELAYED_RELEASE_TABLET | Freq: Two times a day (BID) | ORAL | Status: DC
  Administered 2013-04-29 – 2013-04-30 (×2): 40 mg via ORAL
  Filled 2013-04-29 (×2): qty 1

## 2013-04-29 MED ORDER — SODIUM BICARBONATE 8.4 % IV SOLN
INTRAVENOUS | Status: DC
  Administered 2013-04-29: 11:00:00 via INTRAVENOUS
  Filled 2013-04-29 (×3): qty 150

## 2013-04-29 MED ORDER — DOPAMINE-DEXTROSE 3.2-5 MG/ML-% IV SOLN: 2.0000 ug/kg/min | INTRAVENOUS | Status: DC

## 2013-04-29 MED ORDER — PANTOPRAZOLE SODIUM 40 MG PO TBEC
40.0000 mg | DELAYED_RELEASE_TABLET | Freq: Every day | ORAL | Status: DC
  Administered 2013-04-29: 40 mg via ORAL
  Filled 2013-04-29: qty 1

## 2013-04-29 MED ORDER — POTASSIUM CHLORIDE CRYS ER 20 MEQ PO TBCR
40.0000 meq | EXTENDED_RELEASE_TABLET | Freq: Once | ORAL | Status: AC
  Administered 2013-04-29: 40 meq via ORAL
  Filled 2013-04-29: qty 2

## 2013-04-29 MED ORDER — SODIUM CHLORIDE 0.9 % IV SOLN: INTRAVENOUS | Status: DC

## 2013-04-29 MED ORDER — POTASSIUM CHLORIDE 10 MEQ/100ML IV SOLN
10.0000 meq | INTRAVENOUS | Status: AC
  Administered 2013-04-29 (×3): 10 meq via INTRAVENOUS
  Filled 2013-04-29 (×2): qty 100

## 2013-04-29 MED ORDER — BOOST / RESOURCE BREEZE PO LIQD
1.0000 | Freq: Two times a day (BID) | ORAL | Status: DC
  Administered 2013-04-29 – 2013-05-05 (×13): 1 via ORAL

## 2013-04-29 MED ORDER — POTASSIUM CHLORIDE 10 MEQ/50ML IV SOLN: 10.0000 meq | INTRAVENOUS | Status: DC

## 2013-04-29 MED ORDER — MAGNESIUM SULFATE 50 % IJ SOLN
3.0000 g | Freq: Once | INTRAVENOUS | Status: AC
  Administered 2013-04-29: 3 g via INTRAVENOUS
  Filled 2013-04-29: qty 6

## 2013-04-29 MED ORDER — POTASSIUM CHLORIDE 10 MEQ/100ML IV SOLN: 10.0000 meq | INTRAVENOUS | Status: AC

## 2013-04-29 NOTE — Progress Notes (Signed)
Daily Rounding Note  04/29/2013, 10:07 AM  LOS: 4 days   SUBJECTIVE:       No bleeding this AM.  Tolerated clears.   succesful embolization of right colic branch SMA  OBJECTIVE:         Vital signs in last 24 hours:    Temp:  [97.3 F (36.3 C)-99.5 F (37.5 C)] 98.4 F (36.9 C) (03/12 0800) Pulse Rate:  [32-115] 61 (03/12 0700) Resp:  [12-22] 16 (03/11 2345) BP: (86-139)/(28-93) 111/35 mmHg (03/12 0800) SpO2:  [94 %-100 %] 99 % (03/12 0800) Arterial Line BP: (60-159)/(31-68) 81/68 mmHg (03/11 2045) Last BM Date: 04/28/13 General: looks much better, alert comfortable   Heart: RRR Chest: +cough, no dyspnea Abdomen: soft, NT, ND.  BS hypoactive  Extremities: no pedal edema.  Feet warm, toes slightly cool.  No cyanosis Neuro/Psych:  Pleasant, appropriate, alert.   Intake/Output from previous day: 03/11 0701 - 03/12 0700 In: 920 [I.V.:522.5; Blood:397.5] Out: 500 [Urine:500]  Intake/Output this shift:    Lab Results:  Recent Labs  04/29/13 0030 04/29/13 0456 04/29/13 0730  WBC 12.0* 11.0* 10.4  HGB 9.9* 9.8* 9.2*  HCT 27.6* 27.1* 25.2*  PLT 113* 108* 105*   BMET  Recent Labs  04/27/13 0535 04/28/13 0157 04/29/13 0456  NA 144 146 142  K 3.7 4.6 3.4*  CL 110 115* 112  CO2 18* 16* 14*  GLUCOSE 151* 159* 174*  BUN 53* 62* 48*  CREATININE 0.90 1.00 0.99  CALCIUM 8.3* 7.0* 7.5*   LFT  Recent Labs  04/29/13 0456  PROT 4.1*  ALBUMIN 2.3*  AST 71*  ALT 30  ALKPHOS 35*  BILITOT 0.4   PT/INR  Recent Labs  04/27/13 1600  LABPROT 15.3*  INR 1.24   Hepatitis Panel No results found for this basename: HEPBSAG, HCVAB, HEPAIGM, HEPBIGM,  in the last 72 hours  Studies/Results: Nm Gi Blood Loss  04/28/2013   CLINICAL DATA Hematochezia  EXAM NUCLEAR MEDICINE GASTROINTESTINAL BLEEDING SCAN  TECHNIQUE Sequential abdominal images were obtained following intravenous administration of Tc-61m labeled red blood cells.  RADIOPHARMACEUTICALS 25 MILLI CURIE ULTRATAG TECHNETIUM TC 59M-LABELED RED BLOOD CELLS IV KIT  COMPARISON None  FINDINGS Abnormal red cell localization is identified in the right upper quadrant beginning at 31 min.  This abnormal tracer continues through multiple loops of bowel in the mid abdomen and right upper quadrant.  This probably represents a duodenal or proximal jejunal bleeding source.  No tracer is seen along the expected course of the colon.  IMPRESSION Abnormal GI bleeding exam demonstrating what appears to be small bowel localization of tracer in the upper mid abdomen in right upper quadrant, question duodenal versus proximal jejunal origin.  No definite colonic localization of labeled erythrocytes is identified.  SIGNATURE  Electronically Signed   By: MLavonia DanaM.D.   On: 04/28/2013 13:14   Dg Chest Port 1 View  04/27/2013   CLINICAL DATA A central line.  GI bleed both wall  EXAM PORTABLE CHEST - 1 VIEW  COMPARISON 10/13/2009  FINDINGS Examination is limited by rightward rotation. This is the best obtainable exam due to patient condition.  Right IJ catheter, tip at the mid to upper SVC. No pneumothorax or hemothorax. Cardiomegaly, comparison with prior limited due to above. Interstitial coarsening with interstitial crowding from hypoaeration.  IMPRESSION Right IJ catheter without adverse features.  SIGNATURE  Electronically Signed   By: JGilford SilviusD.  On: 04/27/2013 15:56    ASSESMENT:   *  GI bleed.  At this point looks like it was a diverticular bleed  EGD 3/10:  Small HH, no blood.  No source for bleeding NM RBC scan 3/11 bleeding tracer at RUQ/?jejunal location Enteroscopy 3/11 past lig of treitz:  No bleeding or lesions.   endoclip place to help with location on future imaging and interventions.  Angiogram 3/11 with coil embolization of right colic branch of SMA.  On once daily IV Protonix. Started on clears.   *  Non STEMI in setting of GIB, ABL  with demand ischemia.    *  ABL anemia. Multiple transfusions.  Relatively stable since embolization. 7 units PRBCs thus far.   *   Azotemia, improved after resolution of UGIB   PLAN   *  Change CBC to q 8 hours.  *  Does he need the PPI, not on this PTA, so will change to po but consider quickly stopping this.     Azucena Freed  04/29/2013, 10:07 AM Pager: [phone] Attending MD note:   I have taken a history, examined the patient, and reviewed the chart. I agree with the Advanced Practitioner's impression and recommendations. He looks much better this morning. Advance to clear liquids. No further bleeding  [facility] Pager # [phone]

## 2013-04-29 NOTE — Progress Notes (Signed)
[facility] Progress Note Patient Name: MENDY LAPINSKY DOB: May 05, 1922 MRN: [id number]  Date of Service  04/29/2013   HPI/Events of Note   Met acidosis, still on bicarb gtt, overall clinical picture improving  eICU Interventions  Repeat BMET and reassess need for bicarb gtt   Intervention Category Major Interventions: Acid-Base disturbance - evaluation and management  Kesia Dalto 04/29/2013, 10:20 PM

## 2013-04-29 NOTE — Clinical Social Work Psychosocial (Signed)
Clinical Social Work Department BRIEF PSYCHOSOCIAL ASSESSMENT 04/29/2013  Patient:  Philip Simpson, Philip Simpson     Account Number:  [id number]     Admit date:  04/25/2013  Clinical Social Worker:  Lovey Newcomer  Date/Time:  04/28/2013 03:45 PM  Referred by:  Physician  Date Referred:  04/28/2013 Referred for  SNF Placement   Other Referral:   Interview type:  Family Other interview type:   Patient not alert and oriented at time of assessment. CSW contact patient's son by phone to complete assessment.    PSYCHOSOCIAL DATA Living Status:  ALONE Admitted from facility:   Level of care:   Primary support name:  Donzell Coller [phone] Primary support relationship to patient:  CHILD, ADULT Degree of support available:   Support is good. Patient has supportive family. Patient's son lives "a few houses down."    CURRENT CONCERNS Current Concerns  Post-Acute Placement   Other Concerns:    SOCIAL WORK ASSESSMENT / PLAN CSW contacted patient's son by phone to complete assessment as no family is currently at bedside. Patient's son Louie Casa states that patient has been living alone and is mostly independent. Louie Casa states that patient "goes grocery shopping on his own, can cook for himself, and got around fine." Louie Casa does help patient with house work. CSW explained that PT has recommended SNF for patient for short term rehab. Louie Casa states that he was expecting this and is agreeable to SNF placement for patient. CSW explained SNF search/placement process and [facility] authorization process.   Assessment/plan status:  Psychosocial Support/Ongoing Assessment of Needs Other assessment/ plan:   Complete FL2, Fax, PASRR, Submit for [employer] Auth   Information/referral to community resources:   CSW contact information and SNF list left in patient's room for [facility].    PATIENT'S/FAMILY'S RESPONSE TO PLAN OF CARE: Louie Casa agrees that patient needs SNF placement and is agreeable to SNF  at discharge. Louie Casa was pleasant, appopriate, and appreciative of CSW contact. Louie Casa seems indifferent about which SNF patient goes to. CSW will follow up with bed offers when available.       Liz Beach, [place], [place], [phone]

## 2013-04-29 NOTE — Op Note (Signed)
[facility] [place], [address]   ENDOSCOPY PROCEDURE REPORT  PATIENT: Philip, Simpson  MR#: [id number] BIRTHDATE: May 12, 1922 , 89  yrs. old GENDER: Male ENDOSCOPIST: Lafayette Dragon, MD REFERRED BY:  Dr Odella Aquas PROCEDURE DATE:  04/27/2013 PROCEDURE:  EGD, diagnostic ASA CLASS:     Class IV INDICATIONS:  hypotension.  Hematochezia.  Elevated BUN, the bleeding started after a fall, positive troponins. MEDICATIONS: Benadryl 12.5 mg IV TOPICAL ANESTHETIC: none  DESCRIPTION OF PROCEDURE: After the risks benefits and alternatives of the procedure were thoroughly explained, informed consent was obtained.  The Pentax Gastroscope [id number] endoscope was introduced through the mouth and advanced to the second portion of the duodenum. Without limitations.  The instrument was slowly withdrawn as the mucosa was fully examined.      Esophagus, upper mid and distal esophageal mucosa appeared normal. There were no esophageal varices or esophagitis. The squamocolumnar junction appeared normal. There was no blood in the esophagus Stomach: Gastric mucosa was unremarkable. Pyloric antrum was mildly deformed but otherwise normal. There was no ulceration and there was no blood in the stomach. There was no sign of recent bleeding. Retroflexion of the endoscope revealed normal fundus and cardia,there was a small 2 cm reducible hiatal hernia Duodenum: Duodenal bulb and descending duodenum was unremarkable. Again there was no sign of bleeding ulceration or AV malformation[         The scope was then withdrawn from the patient and the procedure completed.  COMPLICATIONS: There were no complications. ENDOSCOPIC IMPRESSION:  small reducible hiatal hernia, otherwise normal upper endoscopy of esophagus stomach and duodenum. Nothing to account for GI bleed Following endoscopic procedure patient evacuated large amount of maroon and port wine-colored  stool   RECOMMENDATIONS:  suspect lower GI bleed. We'll observe through the night and if bleeding continues will obtain a red blood cell pool scan  in the nuclear medicine H&H every 4 hours Allow clear liquids No anticoagulants while patient is bleeding  REPEAT EXAM: no  eSigned:  Lafayette Dragon, MD 04/27/2013 6:30 PM   CC:  PATIENT NAME:  Philip, Simpson MR#: [id number]

## 2013-04-29 NOTE — Progress Notes (Signed)
1 Day Post-Op  Subjective: Comfortable this morning No complaints Hemodynamically stable post procedure  Objective: Vital signs in last 24 hours: Temp:  [97.3 F (36.3 C)-99.5 F (37.5 C)] 97.9 F (36.6 C) (03/12 0400) Pulse Rate:  [32-115] 61 (03/12 0700) Resp:  [12-22] 16 (03/11 2345) BP: (86-139)/(28-93) 118/81 mmHg (03/12 0700) SpO2:  [94 %-100 %] 99 % (03/12 0700) Arterial Line BP: (60-159)/(28-68) 81/68 mmHg (03/11 2045) Last BM Date: 04/28/13  Intake/Output from previous day: 03/11 0701 - 03/12 0700 In: 920 [I.V.:522.5; Blood:397.5] Out: 500 [Urine:500] Intake/Output this shift:    Abdomen soft, non tender  Lab Results:   Recent Labs  04/29/13 0030 04/29/13 0456  WBC 12.0* 11.0*  HGB 9.9* 9.8*  HCT 27.6* 27.1*  PLT 113* 108*   BMET  Recent Labs  04/28/13 0157 04/29/13 0456  NA 146 142  K 4.6 3.4*  CL 115* 112  CO2 16* 14*  GLUCOSE 159* 174*  BUN 62* 48*  CREATININE 1.00 0.99  CALCIUM 7.0* 7.5*   PT/INR  Recent Labs  04/27/13 1600  LABPROT 15.3*  INR 1.24   ABG  Recent Labs  04/28/13 0357  PHART 7.396  HCO3 15.6*    Studies/Results: Nm Gi Blood Loss  04/28/2013   CLINICAL DATA Hematochezia  EXAM NUCLEAR MEDICINE GASTROINTESTINAL BLEEDING SCAN  TECHNIQUE Sequential abdominal images were obtained following intravenous administration of Tc-20mlabeled red blood cells.  RADIOPHARMACEUTICALS 25 MILLI CURIE ULTRATAG TECHNETIUM TC 14M-LABELED RED BLOOD CELLS IV KIT  COMPARISON None  FINDINGS Abnormal red cell localization is identified in the right upper quadrant beginning at 31 min.  This abnormal tracer continues through multiple loops of bowel in the mid abdomen and right upper quadrant.  This probably represents a duodenal or proximal jejunal bleeding source.  No tracer is seen along the expected course of the colon.  IMPRESSION Abnormal GI bleeding exam demonstrating what appears to be small bowel localization of tracer in the upper mid  abdomen in right upper quadrant, question duodenal versus proximal jejunal origin.  No definite colonic localization of labeled erythrocytes is identified.  SIGNATURE  Electronically Signed   By: MLavonia DanaM.D.   On: 04/28/2013 13:14   Dg Chest Port 1 View  04/27/2013   CLINICAL DATA A central line.  GI bleed both wall  EXAM PORTABLE CHEST - 1 VIEW  COMPARISON 10/13/2009  FINDINGS Examination is limited by rightward rotation. This is the best obtainable exam due to patient condition.  Right IJ catheter, tip at the mid to upper SVC. No pneumothorax or hemothorax. Cardiomegaly, comparison with prior limited due to above. Interstitial coarsening with interstitial crowding from hypoaeration.  IMPRESSION Right IJ catheter without adverse features.  SIGNATURE  Electronically Signed   By: JJorje GuildM.D.   On: 04/27/2013 15:56    Anti-infectives: Anti-infectives   None      Assessment/Plan: s/p Procedure(s) with comments: ENTEROSCOPY (N/A) - bedside  lower GI bleeding controlled by IR embolization  If he rebleeds, would need an extended right partial colectomy now that site has been localized.  LOS: 4 days    Philip Simpson A 04/29/2013

## 2013-04-29 NOTE — Progress Notes (Signed)
[facility] ADULT ICU REPLACEMENT PROTOCOL FOR AM LAB REPLACEMENT ONLY  The patient does apply for the [facility] Adult ICU Electrolyte Replacment Protocol based on the criteria listed below:   1. Is GFR >/= 40 ml/min? yes  Patient's GFR today is 71 2. Is urine output >/= 0.5 ml/kg/hr for the last 6 hours? no Patient's UOP is 0.5 ml/kg/hr 3. Is BUN < 60 mg/dL? yes  Patient's BUN today is 48 4. Abnormal electrolyte(s):Potassium 5. Ordered repletion with: Potassium per Protocol  Conley Canal P 04/29/2013 6:36 AM

## 2013-04-29 NOTE — Progress Notes (Signed)
INITIAL NUTRITION ASSESSMENT  DOCUMENTATION CODES Per approved criteria  -Not Applicable   INTERVENTION: Resource Breeze po BID, each supplement provides 250 kcal and 9 grams of protein  NUTRITION DIAGNOSIS: Inadequate oral intake related to medical procedure as evidenced by clear liquid diet.   Goal: Pt to meet >/= 90% of their estimated nutrition needs   Monitor:  Wt trends, po intake, labs  Reason for Assessment: Low Braden  80 y.o. male  Admitting Dx: Upper GI bleed  ASSESSMENT: 80 y.o. Male admitted with GI bleed with amemia. He fell and hit his head while on his way to the restroom at home. Pt underwent Enteroscopy on 3/11. If pt rebleeds, will need colectomy per MD note.   Pt reported that he does not think that he has had any recent weight loss. He says that he has felt hungry, but only has been able to have clear liquids. Pt has a stage II wound on sacrum.   Height: Ht Readings from Last 1 Encounters:  04/25/13 5\' 7"  (1.702 m)    Weight: Wt Readings from Last 1 Encounters:  04/27/13 174 lb 14.4 oz (79.334 kg)    Ideal Body Weight: 66.1 kg  % Ideal Body Weight: 120%  Wt Readings from Last 10 Encounters:  04/27/13 174 lb 14.4 oz (79.334 kg)  04/27/13 174 lb 14.4 oz (79.334 kg)  04/27/13 174 lb 14.4 oz (79.334 kg)  02/02/13 172 lb 12.8 oz (78.382 kg)    Usual Body Weight: unknown, no suspected wt loss  % Usual Body Weight: unknown  BMI:  Body mass index is 27.39 kg/(m^2).  Estimated Nutritional Needs: Kcal: 2000-2200 Protein: 95-105 g Fluid: 2.0-2.2 L/day  Skin: stage II wound on sacrum, incision on groin, skin tear on elbow, excoriation on penis  Diet Order: Clear Liquid  EDUCATION NEEDS: -Education needs addressed   Intake/Output Summary (Last 24 hours) at 04/29/13 1010 Last data filed at 04/29/13 0617  Gross per 24 hour  Intake 915.29 ml  Output    500 ml  Net 415.29 ml    Last BM: 3/11   Labs:   Recent Labs Lab  04/27/13 0535 04/28/13 0157 04/29/13 0456  NA 144 146 142  K 3.7 4.6 3.4*  CL 110 115* 112  CO2 18* 16* 14*  BUN 53* 62* 48*  CREATININE 0.90 1.00 0.99  CALCIUM 8.3* 7.0* 7.5*  MG  --   --  1.7  GLUCOSE 151* 159* 174*    CBG (last 3)   Recent Labs  04/29/13 0105 04/29/13 0346 04/29/13 0810  GLUCAP 173* 173* 135*    Scheduled Meds: . cholecalciferol  400 Units Oral Daily  . insulin aspart  0-15 Units Subcutaneous 6 times per day  . nitroGLYCERIN      . pantoprazole (PROTONIX) IV  40 mg Intravenous Q24H    Continuous Infusions: . sodium chloride 10 mL/hr (04/29/13 0200)  . norepinephrine (LEVOPHED) Adult infusion 27 mcg/min (04/29/13 0617)    Past Medical History  Diagnosis Date  . Hyperlipidemia   . Hypertension   . Arthritis   . Bladder cancer 2003  . Prediabetes   . Vitamin D deficiency   . BPH (benign prostatic hyperplasia)   . 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 Cysto  2003  . Tonsillectomy    . Cataract extraction Right   . Esophagogastroduodenoscopy N/A 04/27/2013  Procedure: ESOPHAGOGASTRODUODENOSCOPY (EGD);  Surgeon: Lafayette Dragon, MD;  Location: [facility] ENDOSCOPY;  Service: Endoscopy;  Laterality: N/A;  . Enteroscopy N/A 04/28/2013    Procedure: ENTEROSCOPY;  Surgeon: Lafayette Dragon, MD;  Location: [facility] ENDOSCOPY;  Service: Endoscopy;  Laterality: N/A;  bedside    Terrace Arabia RD, LDN

## 2013-04-29 NOTE — Progress Notes (Signed)
Orthopedic Tech Progress Note Patient Details:  Philip Simpson 09-19-1922 [id number]  Ortho Devices Type of Ortho Device: Knee Immobilizer   Katheren Shams 04/29/2013, 1:32 AM

## 2013-04-29 NOTE — Progress Notes (Signed)
[facility] PROGRESS NOTE  Philip Simpson HYW:[id number] DOB: 23-Nov-1922 DOA: 04/25/2013 PCP: Alesia Richards, MD  Assessment/Plan:  Hypovolemic shock.  -Patient having a GI bleed overnight, and continued this morning.  -Systolic blood pressures falling into the 70s for which he was transferred to the intensive care unit.  -Pulmonary critical care medicine involved, started on IV pressors.  -Will continue monitoring H&H, transfuse, IV fluid resuscitation.  -4 unit PRBC 04/27/2013 -3 Unit PRBC 04/28/2013  -Counseled patient and son today that if his hemoglobin stays stable overnight may consider discharging a.m.   Suspected upper GI bleed  -Gastroenterology consulted  -Patient becoming unstable after arrival to endoscopy units, could not undergo upper endoscopy as planned  -Continue blood transfusions, endoscopy when hemodynamically stable.  -Continue IV PPI therapy  -nuclear medicine bleeding scan; see results below -SMA embolization performed 04/28/2013 see results will -Increase Protonix to 40 mg BID  Acute hemorrhagic anemia  -Patient's hemoglobin goal and is 6.2 overnight as he was typed and crossed and transfused with packed red blood cells  -Noted by nursing staff to have a red blood, possible this may represent upper GI bleed  -Continue monitoring H&H, transfuse as needed   Syncope  -Initial CT scan of brain negative  -Troponins were cycled, come back positive with peak troponin of 1.55 as cardiology was consulted.  -It is possible underlying anemia may have contributed to syncopal event. However currently patient becomes hypotensive when off Levophed  Hypotension -  Patient continues to be hypotensive requiring Levophed currently at 27 mcg/ min -Counseled patient and son that we would try to continue to titrate him off the Levophed during the day. -Continue hydration sodium bicarbonate 100 ml/hr  Elevated troponins/NSTEMI -Could be secondary to demand ischemia  from anemia  -Continue blood transfusions (patient received 4 unit PRBC 3/10) -Cardiology following recommends transfuse patient to maintain hemoglobin> 8 -Secondary to patient's hypotension unable to use beta blocker; unable use aspirin or heparin secondary to his GI bleed. -Will continue to trend his troponins  Diastolic CHF -Mild; see elevated troponins  Hypokalemia -Potassium goal> 59mq/L -Replete and check in the a.m.  Hypomagnesemia -Magnesium goal> 261mdL -Replete and check in the a.m.  Hyperlipidemia  -Will speak with son concerning patient's listed in tolerance of statins   Prediabetes  - Obtain hemoglobin A1c, continue sliding-scale insulin   Nondisplaced Left periorbital fracture  - Evaluated by Dr. TeBenjamine Moladoes not feel patient needs any surgical intervention at this time and follow up outpatient one week after dc      Code Status: Full Family Communication: Son present Disposition Plan: Per GI   Consultants: Dr. DoDelfin EdisGI) Dr. DaMerrie Roofcritical-care)   Procedures: IR EMBO ART VEN HEMORR LYSidneyI[[id number]: Custom)] 04/28/2013  Successful peripheral SMA rt colic branch [id number] 3mm79mil embolization for an acute lower GI bleed angiographically in the hepatic flexure  Nuclear medicine GI blood cell scan 04/28/2013 Abnormal GI bleeding exam demonstrating what appears to be small  bowel localization of tracer in the upper mid abdomen in right upper  quadrant, question duodenal versus proximal jejunal origin.  No definite colonic localization of labeled erythrocytes is  identified.   EGD by Dr. BrodOlevia Perches0/2015 Small reducible hiatal hernia  No blood or bleeding from upper GI tract  No lesions to account for GIB  Shortly following completion of the EGD, pt evacuated large volume of maroon stool  Central venous catheter insertion 04/27/2013   Echocardiogram 04/26/2013  Left ventricle: The cavity size was normal.  Wall thickness was normal.  -LVEF= 55% to 60%.  -(grade 1 diastolic dysfunction). - Aortic valve: AV is thickened, calcified with minimally restricted motion. - Mitral valve: Mild regurgitation.     Antibiotics:    HPI/Subjective: Patient is an 80 yo gentleman  PMHx  hypertension, dyslipidemia, anemia, admitted to the medicine service on 04/25/2013. Family members that reported that patient was highly functional, able to perform all activities of daily living. It appears on the morning of admission he may have had a syncopal event that resulted in head injury. He was brought to the emergency department where CT scan showed a left paravertebral fracture. Was seen and evaluated by ENT, patient not felt to warrant surgical intervention. On the evening of 04/26/2013 he had 3 episodes of bloody stools. Repeat hemoglobin and hematocrit came back low at 6.2 and 17.7. He was typed and crossed and transfused with one unit of packed red blood cells. On the following morning he was administered a second unit of packed red blood cells. At 11:40 AM on 04/27/2013 his blood pressures have remained stable having blood pressure 120/90 then he was sat up with a rapid fall in his blood pressures to 71/49. Transfusion of packed red blood cells were continued however despite this his blood pressures remained low and could not undergo upper endoscopy. At this point he was transferred to the intensive care unit where he was administered blood and started on IV pressors. Patient's son present at bedside was updated on condition. 3/12 patient in much better spirits today. Answers questions appropriately requested no when he can be discharged.   Objective: Filed Vitals:   04/29/13 0617 04/29/13 0630 04/29/13 0645 04/29/13 0700  BP: 93/48 100/32 111/31 118/81  Pulse: 63 78 63 61  Temp:      TempSrc:      Resp:      Height:      Weight:      SpO2: 100% 97% 100% 99%    Intake/Output Summary (Last 24 hours) at  04/29/13 0759 Last data filed at 04/29/13 0617  Gross per 24 hour  Intake 919.99 ml  Output    500 ml  Net 419.99 ml   Filed Weights   04/26/13 0400 04/26/13 2355 04/27/13 0418  Weight: 76.7 kg (169 lb 1.5 oz) 79.153 kg (174 lb 8 oz) 79.334 kg (174 lb 14.4 oz)    Exam:   General: A./O. x4, NAD, Son feels close to baseline   Cardiovascular: Regular rhythm and rate, negative murmurs rubs or gallops  Respiratory: Mild crackles right lower lobe   Abdomen: Soft, nontender, nondistended, normal bowel sounds  Musculoskeletal: Negative pedal edema, , bruise under left thigh consistent with fall/fracture.  Data Reviewed: Basic Metabolic Panel:  Recent Labs Lab 04/25/13 1823 04/26/13 0125 04/27/13 0535 04/28/13 0157 04/29/13 0456  NA 143 142 144 146 142  K 4.7 3.8 3.7 4.6 3.4*  CL 108 107 110 115* 112  CO2 21 22 18* 16* 14*  GLUCOSE 175* 165* 151* 159* 174*  BUN 38* 36* 53* 62* 48*  CREATININE 0.80 0.87 0.90 1.00 0.99  CALCIUM 9.3 8.6 8.3* 7.0* 7.5*  MG  --   --   --   --  1.7   Liver Function Tests:  Recent Labs Lab 04/26/13 0125 04/29/13 0456  AST 18 71*  ALT 10 30  ALKPHOS 47 35*  BILITOT 0.3 0.4  PROT 5.1* 4.1*  ALBUMIN 2.7* 2.3*  No results found for this basename: LIPASE, AMYLASE,  in the last 168 hours No results found for this basename: AMMONIA,  in the last 168 hours CBC:  Recent Labs Lab 04/25/13 1823 04/26/13 0125  04/28/13 0604 04/28/13 1350 04/28/13 1934 04/29/13 0030 04/29/13 0456  WBC 10.1 7.0  < > 8.9 7.1 10.5 12.0* 11.0*  NEUTROABS 9.2* 6.1  --   --   --   --   --  8.7*  HGB 10.0* 8.4*  < > 8.1* 6.2* 9.7* 9.9* 9.8*  HCT 28.8* 24.0*  < > 22.2* 17.3* 27.1* 27.6* 27.1*  MCV 99.0 98.8  < > 87.7 88.3 88.0 88.7 88.3  PLT 178 155  < > 113* 95* 110* 113* 108*  < > = values in this interval not displayed. Cardiac Enzymes:  Recent Labs Lab 04/26/13 0125 04/26/13 0900 04/26/13 1301 04/26/13 2150 04/28/13 1046 04/29/13 0030  CKTOTAL   --  504* 619* 853*  --   --   CKMB  --  16.4* 17.3*  --   --   --   TROPONINI 0.64* 1.55* 1.54* 2.79* 15.27* 19.26*   BNP (last 3 results) No results found for this basename: PROBNP,  in the last 8760 hours CBG:  Recent Labs Lab 04/28/13 1343 04/28/13 1818 04/28/13 2027 04/29/13 0105 04/29/13 0346  GLUCAP 112* 166* 155* 173* 173*    Recent Results (from the past 240 hour(s))  MRSA PCR SCREENING     Status: None   Collection Time    04/27/13  2:38 PM      Result Value Ref Range Status   MRSA by PCR NEGATIVE  NEGATIVE Final   Comment:            The GeneXpert MRSA Assay (FDA     approved for NASAL specimens     only), is one component of a     comprehensive MRSA colonization     surveillance program. It is not     intended to diagnose MRSA     infection nor to guide or     monitor treatment for     MRSA infections.     Studies: Nm Gi Blood Loss  04/28/2013   CLINICAL DATA Hematochezia  EXAM NUCLEAR MEDICINE GASTROINTESTINAL BLEEDING SCAN  TECHNIQUE Sequential abdominal images were obtained following intravenous administration of Tc-45mlabeled red blood cells.  RADIOPHARMACEUTICALS 25 MILLI CURIE ULTRATAG TECHNETIUM TC 36M-LABELED RED BLOOD CELLS IV KIT  COMPARISON None  FINDINGS Abnormal red cell localization is identified in the right upper quadrant beginning at 31 min.  This abnormal tracer continues through multiple loops of bowel in the mid abdomen and right upper quadrant.  This probably represents a duodenal or proximal jejunal bleeding source.  No tracer is seen along the expected course of the colon.  IMPRESSION Abnormal GI bleeding exam demonstrating what appears to be small bowel localization of tracer in the upper mid abdomen in right upper quadrant, question duodenal versus proximal jejunal origin.  No definite colonic localization of labeled erythrocytes is identified.  SIGNATURE  Electronically Signed   By: MLavonia DanaM.D.   On: 04/28/2013 13:14   Dg Chest Port 1  View  04/27/2013   CLINICAL DATA A central line.  GI bleed both wall  EXAM PORTABLE CHEST - 1 VIEW  COMPARISON 10/13/2009  FINDINGS Examination is limited by rightward rotation. This is the best obtainable exam due to patient condition.  Right IJ catheter, tip at the mid to upper  SVC. No pneumothorax or hemothorax. Cardiomegaly, comparison with prior limited due to above. Interstitial coarsening with interstitial crowding from hypoaeration.  IMPRESSION Right IJ catheter without adverse features.  SIGNATURE  Electronically Signed   By: Jorje Guild M.D.   On: 04/27/2013 15:56    Scheduled Meds: . cholecalciferol  400 Units Oral Daily  . fentaNYL      . insulin aspart  0-15 Units Subcutaneous 6 times per day  . midazolam      . nitroGLYCERIN      . pantoprazole (PROTONIX) IV  40 mg Intravenous Q24H  . potassium chloride  10 mEq Intravenous Q1 Hr x 2   Continuous Infusions: . sodium chloride 10 mL/hr (04/29/13 0200)  . norepinephrine (LEVOPHED) Adult infusion 27 mcg/min (04/29/13 0617)    Principal Problem:   Upper GI bleed Active Problems:   Hyperlipidemia   Hypertension   Prediabetes   Syncope   Orbital fracture   Anemia   Hemorrhage of gastrointestinal tract, unspecified   Hemorrhagic shock   NSTEMI (non-ST elevated myocardial infarction)   Nonspecific (abnormal) findings on radiological and other examination of gastrointestinal tract    Time spent: 35 minute   Neomi Laidler, J  [facility] Pager [phone]. If 7PM-7AM, please contact night-coverage at [email or web address], password [facility] 04/29/2013, 7:59 AM  LOS: 4 days

## 2013-04-29 NOTE — Progress Notes (Signed)
PULMONARY / CRITICAL CARE MEDICINE  Name: Philip Simpson MRN: [id number] DOB: January 29, 1923    ADMISSION DATE:  04/25/2013 CONSULTATION DATE:  04/27/2013  REFERRING MD :  [facility] PRIMARY SERVICE:  PCCM  CHIEF COMPLAINT:  Hypotension  BRIEF PATIENT DESCRIPTION: 80 yo with history of bleeding hemorrhoids admitted 3/8 after syncopal episode and fall resulting in periorbital fracture.  EKG revealed RBBB and ST-T changes.  Troponin was elevated.  Course was complicated by brisk lower GI hemorrhage.  Endoscopy was planned, but aborted due to hypotension and the patient was brought to ICU for further management.  SIGNIFICANT EVENTS / STUDIES:  3/8    CT head >>> nad, chronic hygromas 3/8    CT maxillofacial >>> L orbital fracture 3/9    TTE >>> EF 60, grade 1 DD 3/10  Hypotensive, endoscopy aborted, brought to ICU 3/10  EGD >>> Small hiatal hernia, no active hemorrhage 3/11  NM Bleeding Scan >>> Active hemorrhage small bowel vs colon 3/11  EGD >>> No source of hemorrhage 3/11  IR >>> Hepatic flexure hemorrhage, successful embolization of R colic branch of SMA  LINES / TUBES: R IJ Cordis 3/10 >>>  CULTURES:  ANTIBIOTICS:  INTERVAL HISTORY:  Feeling better.  Still on vasopressors. VITAL SIGNS: Temp:  [97.3 F (36.3 C)-99.5 F (37.5 C)] 98.4 F (36.9 C) (03/12 0800) Pulse Rate:  [32-115] 61 (03/12 0700) Resp:  [12-22] 16 (03/11 2345) BP: (86-139)/(28-93) 111/35 mmHg (03/12 0800) SpO2:  [94 %-100 %] 99 % (03/12 0800) Arterial Line BP: (60-159)/(31-68) 81/68 mmHg (03/11 2045)  HEMODYNAMICS:   VENTILATOR SETTINGS:   INTAKE / OUTPUT: Intake/Output     03/11 0701 - 03/12 0700 03/12 0701 - 03/13 0700   I.V. (mL/kg) 522.5 (6.6)    Blood 397.5    IV Piggyback     Total Intake(mL/kg) 920 (11.6)    Urine (mL/kg/hr) 500 (0.3)    Total Output 500     Net +420          Urine Occurrence 5 x    Stool Occurrence 6 x     PHYSICAL EXAMINATION: General:  Appears better, no distress Neuro:   Awake, alert HEENT:  PERRL, facial ecchymoses Cardiovascular:  RRR, no m/r/g Lungs:  Few scattered rhonchi  Abdomen:  Soft, nontender, bowel sounds diminished Musculoskeletal:  Moves all extremities, no edema Skin:  Intact  LABS: CBC  Recent Labs Lab 04/29/13 0030 04/29/13 0456 04/29/13 0730  WBC 12.0* 11.0* 10.4  HGB 9.9* 9.8* 9.2*  HCT 27.6* 27.1* 25.2*  PLT 113* 108* 105*   Coag's  Recent Labs Lab 04/27/13 1600  APTT 30  INR 1.24    BMET  Recent Labs Lab 04/27/13 0535 04/28/13 0157 04/29/13 0456  NA 144 146 142  K 3.7 4.6 3.4*  CL 110 115* 112  CO2 18* 16* 14*  BUN 53* 62* 48*  CREATININE 0.90 1.00 0.99  GLUCOSE 151* 159* 174*   Electrolytes  Recent Labs Lab 04/27/13 0535 04/28/13 0157 04/29/13 0456  CALCIUM 8.3* 7.0* 7.5*  MG  --   --  1.7   Sepsis Markers  Recent Labs Lab 04/27/13 1615 04/28/13 0158  LATICACIDVEN 1.7 1.4   ABG  Recent Labs Lab 04/28/13 0357  PHART 7.396  PCO2ART 25.4*  PO2ART 112.0*   Liver Enzymes  Recent Labs Lab 04/26/13 0125 04/29/13 0456  AST 18 71*  ALT 10 30  ALKPHOS 47 35*  BILITOT 0.3 0.4  ALBUMIN 2.7* 2.3*   Cardiac Enzymes  Recent Labs Lab 04/28/13 1046 04/29/13 0030 04/29/13 0730  TROPONINI 15.27* 19.26* 14.25*  PROBNP  --   --  4803.0*   Glucose  Recent Labs Lab 04/28/13 1343 04/28/13 1818 04/28/13 2027 04/29/13 0105 04/29/13 0346 04/29/13 0810  GLUCAP 112* 166* 155* 173* 173* 135*   IMAGING:  Nm Gi Blood Loss  04/28/2013   CLINICAL DATA Hematochezia  EXAM NUCLEAR MEDICINE GASTROINTESTINAL BLEEDING SCAN  TECHNIQUE Sequential abdominal images were obtained following intravenous administration of Tc-23mlabeled red blood cells.  RADIOPHARMACEUTICALS 25 MILLI CURIE ULTRATAG TECHNETIUM TC 56M-LABELED RED BLOOD CELLS IV KIT  COMPARISON None  FINDINGS Abnormal red cell localization is identified in the right upper quadrant beginning at 31 min.  This abnormal tracer continues  through multiple loops of bowel in the mid abdomen and right upper quadrant.  This probably represents a duodenal or proximal jejunal bleeding source.  No tracer is seen along the expected course of the colon.  IMPRESSION Abnormal GI bleeding exam demonstrating what appears to be small bowel localization of tracer in the upper mid abdomen in right upper quadrant, question duodenal versus proximal jejunal origin.  No definite colonic localization of labeled erythrocytes is identified.  SIGNATURE  Electronically Signed   By: MLavonia DanaM.D.   On: 04/28/2013 13:14   Dg Chest Port 1 View  04/27/2013   CLINICAL DATA A central line.  GI bleed both wall  EXAM PORTABLE CHEST - 1 VIEW  COMPARISON 10/13/2009  FINDINGS Examination is limited by rightward rotation. This is the best obtainable exam due to patient condition.  Right IJ catheter, tip at the mid to upper SVC. No pneumothorax or hemothorax. Cardiomegaly, comparison with prior limited due to above. Interstitial coarsening with interstitial crowding from hypoaeration.  IMPRESSION Right IJ catheter without adverse features.  SIGNATURE  Electronically Signed   By: JJorje GuildM.D.   On: 04/27/2013 15:56   ASSESSMENT / PLAN:  PULMONARY A:   No active issues Protects airway P:   Goal SpO2>92 Supplemental oxygen  Add flutter valve  CARDIOVASCULAR A:  NSTEMI vs demand ischemia, troponin peaked Chronic diastolic heart failure Hemorrhagic shock, decreased pressor requirements, lactate reassuring P:  Cardiology following Goal MAP>65 Levophed gtt, titrate to off No ASA - hemorrhage No BB/ACEI - shock  RENAL A:   Preserved renal function Metabolic acidosis ( gap and non-gap ) Hypokalemia Hypovolemia P:   Goal CVP 8-10 Trend BMP NS 1000 x 1 Start Bicarbonate gtt @ 100 K 10 x 2 + 40 x 1  GASTROINTESTINAL A:   Diverticular bleed s/p coil embolization  Hx bleeding hemorrhoids GI Px P:   GI / CCS following Advance  diet Protonix  HEMATOLOGIC A:   Acute blood loss anemia, s/p multiple transfusions, stable P:  Goal Hb>=8 Trend CBC q8h  INFECTIOUS A:   No active issues P:   No intervention required  ENDOCRINE  A:   DM P:   SSI, change to ac&hs  NEUROLOGIC A:   Fall Syncope Zygomatic Fx P:   Defer further work up for now  I have personally obtained history, examined patient, evaluated and interpreted laboratory and imaging results, reviewed medical records, formulated assessment / plan and placed orders.  CRITICAL CARE:  The patient is critically ill with multiple organ systems failure and requires high complexity decision making for assessment and support, frequent evaluation and titration of therapies, application of advanced monitoring technologies and extensive interpretation of multiple databases. Critical Care Time devoted to patient care  services described in this note is 35 minutes.   Doree Fudge, MD [facility] [place] Pager: [phone]  04/29/2013, 10:08 AM

## 2013-04-29 NOTE — Progress Notes (Signed)
[facility] Progress Note Patient Name: RHIAN FUNARI DOB: 1922-08-26 MRN: [id number]  Date of Service  04/29/2013   HPI/Events of Note  Arterial catheter inadvertently removed during IR procedure. NIBP measurements appear to be valid  eICU Interventions  Leave art line out   Intervention Category Intermediate Interventions: Communication with other healthcare providers and/or family  Merton Border 04/29/2013, 12:48 AM

## 2013-04-29 NOTE — Progress Notes (Signed)
Night fellow aware of cardiac enzymes.

## 2013-04-29 NOTE — Progress Notes (Signed)
Subjective: The patient denies any further bleeding or abdominal pain. He does c/o lower back pain which he states he has had for years. He denies any groin access pain.   Objective: Physical Exam: BP 99/45  Pulse 75  Temp(Src) 98.4 F (36.9 C) (Oral)  Resp 16  Ht 5' 7"  (1.702 m)  Wt 174 lb 14.4 oz (79.334 kg)  BMI 27.39 kg/m2  SpO2 100%  Abd: Soft, NT, ND, (+) BS Ext: bilateral groin sites soft, no signs of bleeding or hematoma, DP intact bilaterally.  Labs: CBC  Recent Labs  04/29/13 0456 04/29/13 0730  WBC 11.0* 10.4  HGB 9.8* 9.2*  HCT 27.1* 25.2*  PLT 108* 105*   BMET  Recent Labs  04/28/13 0157 04/29/13 0456  NA 146 142  K 4.6 3.4*  CL 115* 112  CO2 16* 14*  GLUCOSE 159* 174*  BUN 62* 48*  CREATININE 1.00 0.99  CALCIUM 7.0* 7.5*   LFT  Recent Labs  04/29/13 0456  PROT 4.1*  ALBUMIN 2.3*  AST 71*  ALT 30  ALKPHOS 35*  BILITOT 0.4   PT/INR  Recent Labs  04/27/13 1600  LABPROT 15.3*  INR 1.24     Studies/Results: Nm Gi Blood Loss  04/28/2013   CLINICAL DATA Hematochezia  EXAM NUCLEAR MEDICINE GASTROINTESTINAL BLEEDING SCAN  TECHNIQUE Sequential abdominal images were obtained following intravenous administration of Tc-87mlabeled red blood cells.  RADIOPHARMACEUTICALS 25 MILLI CURIE ULTRATAG TECHNETIUM TC 56M-LABELED RED BLOOD CELLS IV KIT  COMPARISON None  FINDINGS Abnormal red cell localization is identified in the right upper quadrant beginning at 31 min.  This abnormal tracer continues through multiple loops of bowel in the mid abdomen and right upper quadrant.  This probably represents a duodenal or proximal jejunal bleeding source.  No tracer is seen along the expected course of the colon.  IMPRESSION Abnormal GI bleeding exam demonstrating what appears to be small bowel localization of tracer in the upper mid abdomen in right upper quadrant, question duodenal versus proximal jejunal origin.  No definite colonic localization of labeled  erythrocytes is identified.  SIGNATURE  Electronically Signed   By: MLavonia DanaM.D.   On: 04/28/2013 13:14   Ir Angiogram Visceral Selective  04/29/2013   CLINICAL DATA:  Lower GI acute bleeding requiring blood transfusions, hypotensive, tachycardic, upper endoscopy negative, nuclear medicine bleeding scan positive for right colonic bleeding  EXAM: ULTRASOUND GUIDANCE VASCULAR ACCESS  ABDOMINAL FLUSH AORTOGRAM  SELECTIVE SMA ANGIOGRAM  SELECTIVE PERIPHERAL SMA RIGHT COLIC BRANCH MICRO CATHETERIZATION AND COIL EMBOLIZATION OF ACUTE ARTERIAL BLEED  Date:  3/12/20153/01/2014 12:19 AM  Radiologist:  MJerilynn Mages TDaryll Brod MD  Guidance:  Ultrasound and fluoroscopic  FLUOROSCOPY TIME:  26 min 6 seconds  MEDICATIONS AND MEDICAL HISTORY: 2.5 mg Versed, 75 mcg fentanyl  ANESTHESIA/SEDATION: 110 min  CONTRAST:  1125mOMNIPAQUE IOHEXOL 300 MG/ML  SOLN  COMPLICATIONS: No immediate  PROCEDURE: Informed consent was obtained from the patient following explanation of the procedure, risks, benefits and alternatives. The patient understands, agrees and consents for the procedure. All questions were addressed. A time out was performed.  Maximal barrier sterile technique utilized including caps, mask, sterile gowns, sterile gloves, large sterile drape, hand hygiene, and Betadine.  Initially, under sterile conditions and local anesthesia, ultrasound right common femoral artery micropuncture access was performed. The micro guidewire would not advance centrally after 3 attempts. This was secondary to shelf-like plaque formation upon contrast injection. Access removed. Hemostasis obtained with manual compression. Attention directed to  the left common femoral artery site. Under sterile conditions and local anesthesia, ultrasound micropuncture access performed of the left common femoral artery. Guidewire advanced easily. Five French sheath inserted over a Bentson guidewire. The pigtail catheter was inserted into the abdominal aorta. Flush  aortogram performed.  Aortogram: Diffuse calcific atherosclerosis. Negative for aneurysm. Celiac, SMA, renal arteries and IMA all appear patent. On the delayed images, there is contrast extravasation in the right abdomen.  Pigtail catheter exchanged for a C2 catheter utilized to select the SMA origin. Selective SMA angiogram performed. There is active contrast extravasation compatible with arterial bleeding from a peripheral right colic branch at the level of the hepatic flexure.  A Renegade micro catheter and micro Glidewire were utilized to select peripheral right colic branches. Selective right colic angiograms performed throughout the hepatic flexure region of the colon. A peripheral right colic branch was eventually catheterize close to the area of active bleeding on previous angiograms. This area was interrogated extensively. 70 mcg nitroglycerin was also given to provoke bleeding in the region. Despite this, no definite active bleeding was demonstrated. However when correlating with the prior study this was close to the original extravasation site. To decrease arterial pressure in the marginal branches, one 3 mm x 6 mm interlock coil was deployed within a peripheral right colic marginal branch.  Micro catheter was retracted. A second right colic branch was catheterized directed to the hepatic flexure. Angiogram of this area demonstrated collateral communication through marginal branches. From a peripheral right colic branch, active bleeding was still present.  The micro catheter was retracted and advanced into the active bleeding right colic branch over a Glidewire. Access was advanced peripheral within the right colic suspicious branch. Selective angiogram within this third right colic branch confirmed active bleeding within the hepatic flexure of the colon with contrast opacifying the colonic lumen.  From this peripheral location, one 2 mm x 3 mm Interlock coil and two 2 mm x 4 cm interlock coils were  deployed in the peripheral right colic branch. Following this, several repeat post embolization angiograms were repeated over approximately 10 min. No further active bleeding was demonstrated.  Catheter removed. Hemostasis obtained with a 6 [place] Starclose device. No immediate complication. Patient tolerated the procedure well.  IMPRESSION: Initial SMA angiogram confirms contrast extravasation from a peripheral right colic branch into the hepatic flexure of the colon compatible with arterial bleeding. This correlates with the nuclear medicine scan.  Peripheral micro catheter access was successful at accessing the peripheral SMA right colic branch to perform micro coil embolization. Post embolization, no further active bleeding could be demonstrated.   Electronically Signed   By: Daryll Brod M.D.   On: 04/29/2013 15:52   Ir Angiogram Selective Each Additional Vessel  04/29/2013   CLINICAL DATA:  Lower GI acute bleeding requiring blood transfusions, hypotensive, tachycardic, upper endoscopy negative, nuclear medicine bleeding scan positive for right colonic bleeding  EXAM: ULTRASOUND GUIDANCE VASCULAR ACCESS  ABDOMINAL FLUSH AORTOGRAM  SELECTIVE SMA ANGIOGRAM  SELECTIVE PERIPHERAL SMA RIGHT COLIC BRANCH MICRO CATHETERIZATION AND COIL EMBOLIZATION OF ACUTE ARTERIAL BLEED  Date:  3/12/20153/01/2014 12:19 AM  Radiologist:  Jerilynn Mages. Daryll Brod, MD  Guidance:  Ultrasound and fluoroscopic  FLUOROSCOPY TIME:  26 min 6 seconds  MEDICATIONS AND MEDICAL HISTORY: 2.5 mg Versed, 75 mcg fentanyl  ANESTHESIA/SEDATION: 110 min  CONTRAST:  136m OMNIPAQUE IOHEXOL 300 MG/ML  SOLN  COMPLICATIONS: No immediate  PROCEDURE: Informed consent was obtained from the patient following explanation of the procedure, risks,  benefits and alternatives. The patient understands, agrees and consents for the procedure. All questions were addressed. A time out was performed.  Maximal barrier sterile technique utilized including caps, mask, sterile  gowns, sterile gloves, large sterile drape, hand hygiene, and Betadine.  Initially, under sterile conditions and local anesthesia, ultrasound right common femoral artery micropuncture access was performed. The micro guidewire would not advance centrally after 3 attempts. This was secondary to shelf-like plaque formation upon contrast injection. Access removed. Hemostasis obtained with manual compression. Attention directed to the left common femoral artery site. Under sterile conditions and local anesthesia, ultrasound micropuncture access performed of the left common femoral artery. Guidewire advanced easily. Five French sheath inserted over a Bentson guidewire. The pigtail catheter was inserted into the abdominal aorta. Flush aortogram performed.  Aortogram: Diffuse calcific atherosclerosis. Negative for aneurysm. Celiac, SMA, renal arteries and IMA all appear patent. On the delayed images, there is contrast extravasation in the right abdomen.  Pigtail catheter exchanged for a C2 catheter utilized to select the SMA origin. Selective SMA angiogram performed. There is active contrast extravasation compatible with arterial bleeding from a peripheral right colic branch at the level of the hepatic flexure.  A Renegade micro catheter and micro Glidewire were utilized to select peripheral right colic branches. Selective right colic angiograms performed throughout the hepatic flexure region of the colon. A peripheral right colic branch was eventually catheterize close to the area of active bleeding on previous angiograms. This area was interrogated extensively. 70 mcg nitroglycerin was also given to provoke bleeding in the region. Despite this, no definite active bleeding was demonstrated. However when correlating with the prior study this was close to the original extravasation site. To decrease arterial pressure in the marginal branches, one 3 mm x 6 mm interlock coil was deployed within a peripheral right colic  marginal branch.  Micro catheter was retracted. A second right colic branch was catheterized directed to the hepatic flexure. Angiogram of this area demonstrated collateral communication through marginal branches. From a peripheral right colic branch, active bleeding was still present.  The micro catheter was retracted and advanced into the active bleeding right colic branch over a Glidewire. Access was advanced peripheral within the right colic suspicious branch. Selective angiogram within this third right colic branch confirmed active bleeding within the hepatic flexure of the colon with contrast opacifying the colonic lumen.  From this peripheral location, one 2 mm x 3 mm Interlock coil and two 2 mm x 4 cm interlock coils were deployed in the peripheral right colic branch. Following this, several repeat post embolization angiograms were repeated over approximately 10 min. No further active bleeding was demonstrated.  Catheter removed. Hemostasis obtained with a 6 [place] Starclose device. No immediate complication. Patient tolerated the procedure well.  IMPRESSION: Initial SMA angiogram confirms contrast extravasation from a peripheral right colic branch into the hepatic flexure of the colon compatible with arterial bleeding. This correlates with the nuclear medicine scan.  Peripheral micro catheter access was successful at accessing the peripheral SMA right colic branch to perform micro coil embolization. Post embolization, no further active bleeding could be demonstrated.   Electronically Signed   By: Daryll Brod M.D.   On: 04/29/2013 15:52   Ir Angiogram Follow Up Study  04/29/2013   CLINICAL DATA:  Lower GI acute bleeding requiring blood transfusions, hypotensive, tachycardic, upper endoscopy negative, nuclear medicine bleeding scan positive for right colonic bleeding  EXAM: ULTRASOUND GUIDANCE VASCULAR ACCESS  ABDOMINAL FLUSH AORTOGRAM  SELECTIVE SMA ANGIOGRAM  SELECTIVE PERIPHERAL SMA RIGHT COLIC BRANCH  MICRO CATHETERIZATION AND COIL EMBOLIZATION OF ACUTE ARTERIAL BLEED  Date:  3/12/20153/01/2014 12:19 AM  Radiologist:  Jerilynn Mages. Daryll Brod, MD  Guidance:  Ultrasound and fluoroscopic  FLUOROSCOPY TIME:  26 min 6 seconds  MEDICATIONS AND MEDICAL HISTORY: 2.5 mg Versed, 75 mcg fentanyl  ANESTHESIA/SEDATION: 110 min  CONTRAST:  150m OMNIPAQUE IOHEXOL 300 MG/ML  SOLN  COMPLICATIONS: No immediate  PROCEDURE: Informed consent was obtained from the patient following explanation of the procedure, risks, benefits and alternatives. The patient understands, agrees and consents for the procedure. All questions were addressed. A time out was performed.  Maximal barrier sterile technique utilized including caps, mask, sterile gowns, sterile gloves, large sterile drape, hand hygiene, and Betadine.  Initially, under sterile conditions and local anesthesia, ultrasound right common femoral artery micropuncture access was performed. The micro guidewire would not advance centrally after 3 attempts. This was secondary to shelf-like plaque formation upon contrast injection. Access removed. Hemostasis obtained with manual compression. Attention directed to the left common femoral artery site. Under sterile conditions and local anesthesia, ultrasound micropuncture access performed of the left common femoral artery. Guidewire advanced easily. Five French sheath inserted over a Bentson guidewire. The pigtail catheter was inserted into the abdominal aorta. Flush aortogram performed.  Aortogram: Diffuse calcific atherosclerosis. Negative for aneurysm. Celiac, SMA, renal arteries and IMA all appear patent. On the delayed images, there is contrast extravasation in the right abdomen.  Pigtail catheter exchanged for a C2 catheter utilized to select the SMA origin. Selective SMA angiogram performed. There is active contrast extravasation compatible with arterial bleeding from a peripheral right colic branch at the level of the hepatic flexure.  A  Renegade micro catheter and micro Glidewire were utilized to select peripheral right colic branches. Selective right colic angiograms performed throughout the hepatic flexure region of the colon. A peripheral right colic branch was eventually catheterize close to the area of active bleeding on previous angiograms. This area was interrogated extensively. 70 mcg nitroglycerin was also given to provoke bleeding in the region. Despite this, no definite active bleeding was demonstrated. However when correlating with the prior study this was close to the original extravasation site. To decrease arterial pressure in the marginal branches, one 3 mm x 6 mm interlock coil was deployed within a peripheral right colic marginal branch.  Micro catheter was retracted. A second right colic branch was catheterized directed to the hepatic flexure. Angiogram of this area demonstrated collateral communication through marginal branches. From a peripheral right colic branch, active bleeding was still present.  The micro catheter was retracted and advanced into the active bleeding right colic branch over a Glidewire. Access was advanced peripheral within the right colic suspicious branch. Selective angiogram within this third right colic branch confirmed active bleeding within the hepatic flexure of the colon with contrast opacifying the colonic lumen.  From this peripheral location, one 2 mm x 3 mm Interlock coil and two 2 mm x 4 cm interlock coils were deployed in the peripheral right colic branch. Following this, several repeat post embolization angiograms were repeated over approximately 10 min. No further active bleeding was demonstrated.  Catheter removed. Hemostasis obtained with a 6 [place] device. No immediate complication. Patient tolerated the procedure well.  IMPRESSION: Initial SMA angiogram confirms contrast extravasation from a peripheral right colic branch into the hepatic flexure of the colon compatible with  arterial bleeding. This correlates with the nuclear medicine scan.  Peripheral micro catheter access was successful  at accessing the peripheral SMA right colic branch to perform micro coil embolization. Post embolization, no further active bleeding could be demonstrated.   Electronically Signed   By: Daryll Brod M.D.   On: 04/29/2013 15:52   Ir US Guide Vasc Access Left  04/29/2013   CLINICAL DATA:  Lower GI acute bleeding requiring blood transfusions, hypotensive, tachycardic, upper endoscopy negative, nuclear medicine bleeding scan positive for right colonic bleeding  EXAM: ULTRASOUND GUIDANCE VASCULAR ACCESS  ABDOMINAL FLUSH AORTOGRAM  SELECTIVE SMA ANGIOGRAM  SELECTIVE PERIPHERAL SMA RIGHT COLIC BRANCH MICRO CATHETERIZATION AND COIL EMBOLIZATION OF ACUTE ARTERIAL BLEED  Date:  3/12/20153/01/2014 12:19 AM  Radiologist:  Jerilynn Mages. Daryll Brod, MD  Guidance:  Ultrasound and fluoroscopic  FLUOROSCOPY TIME:  26 min 6 seconds  MEDICATIONS AND MEDICAL HISTORY: 2.5 mg Versed, 75 mcg fentanyl  ANESTHESIA/SEDATION: 110 min  CONTRAST:  133m OMNIPAQUE IOHEXOL 300 MG/ML  SOLN  COMPLICATIONS: No immediate  PROCEDURE: Informed consent was obtained from the patient following explanation of the procedure, risks, benefits and alternatives. The patient understands, agrees and consents for the procedure. All questions were addressed. A time out was performed.  Maximal barrier sterile technique utilized including caps, mask, sterile gowns, sterile gloves, large sterile drape, hand hygiene, and Betadine.  Initially, under sterile conditions and local anesthesia, ultrasound right common femoral artery micropuncture access was performed. The micro guidewire would not advance centrally after 3 attempts. This was secondary to shelf-like plaque formation upon contrast injection. Access removed. Hemostasis obtained with manual compression. Attention directed to the left common femoral artery site. Under sterile conditions and local  anesthesia, ultrasound micropuncture access performed of the left common femoral artery. Guidewire advanced easily. Five French sheath inserted over a Bentson guidewire. The pigtail catheter was inserted into the abdominal aorta. Flush aortogram performed.  Aortogram: Diffuse calcific atherosclerosis. Negative for aneurysm. Celiac, SMA, renal arteries and IMA all appear patent. On the delayed images, there is contrast extravasation in the right abdomen.  Pigtail catheter exchanged for a C2 catheter utilized to select the SMA origin. Selective SMA angiogram performed. There is active contrast extravasation compatible with arterial bleeding from a peripheral right colic branch at the level of the hepatic flexure.  A Renegade micro catheter and micro Glidewire were utilized to select peripheral right colic branches. Selective right colic angiograms performed throughout the hepatic flexure region of the colon. A peripheral right colic branch was eventually catheterize close to the area of active bleeding on previous angiograms. This area was interrogated extensively. 70 mcg nitroglycerin was also given to provoke bleeding in the region. Despite this, no definite active bleeding was demonstrated. However when correlating with the prior study this was close to the original extravasation site. To decrease arterial pressure in the marginal branches, one 3 mm x 6 mm interlock coil was deployed within a peripheral right colic marginal branch.  Micro catheter was retracted. A second right colic branch was catheterized directed to the hepatic flexure. Angiogram of this area demonstrated collateral communication through marginal branches. From a peripheral right colic branch, active bleeding was still present.  The micro catheter was retracted and advanced into the active bleeding right colic branch over a Glidewire. Access was advanced peripheral within the right colic suspicious branch. Selective angiogram within this third  right colic branch confirmed active bleeding within the hepatic flexure of the colon with contrast opacifying the colonic lumen.  From this peripheral location, one 2 mm x 3 mm Interlock coil and two 2 mm x 4  cm interlock coils were deployed in the peripheral right colic branch. Following this, several repeat post embolization angiograms were repeated over approximately 10 min. No further active bleeding was demonstrated.  Catheter removed. Hemostasis obtained with a 6 [place] Starclose device. No immediate complication. Patient tolerated the procedure well.  IMPRESSION: Initial SMA angiogram confirms contrast extravasation from a peripheral right colic branch into the hepatic flexure of the colon compatible with arterial bleeding. This correlates with the nuclear medicine scan.  Peripheral micro catheter access was successful at accessing the peripheral SMA right colic branch to perform micro coil embolization. Post embolization, no further active bleeding could be demonstrated.   Electronically Signed   By: Daryll Brod M.D.   On: 04/29/2013 15:52   [place] Guide Roadmapping  04/29/2013   CLINICAL DATA:  Lower GI acute bleeding requiring blood transfusions, hypotensive, tachycardic, upper endoscopy negative, nuclear medicine bleeding scan positive for right colonic bleeding  EXAM: ULTRASOUND GUIDANCE VASCULAR ACCESS  ABDOMINAL FLUSH AORTOGRAM  SELECTIVE SMA ANGIOGRAM  SELECTIVE PERIPHERAL SMA RIGHT COLIC BRANCH MICRO CATHETERIZATION AND COIL EMBOLIZATION OF ACUTE ARTERIAL BLEED  Date:  3/12/20153/01/2014 12:19 AM  Radiologist:  Jerilynn Mages. Daryll Brod, MD  Guidance:  Ultrasound and fluoroscopic  FLUOROSCOPY TIME:  26 min 6 seconds  MEDICATIONS AND MEDICAL HISTORY: 2.5 mg Versed, 75 mcg fentanyl  ANESTHESIA/SEDATION: 110 min  CONTRAST:  173m OMNIPAQUE IOHEXOL 300 MG/ML  SOLN  COMPLICATIONS: No immediate  PROCEDURE: Informed consent was obtained from the patient following explanation of  the procedure, risks, benefits and alternatives. The patient understands, agrees and consents for the procedure. All questions were addressed. A time out was performed.  Maximal barrier sterile technique utilized including caps, mask, sterile gowns, sterile gloves, large sterile drape, hand hygiene, and Betadine.  Initially, under sterile conditions and local anesthesia, ultrasound right common femoral artery micropuncture access was performed. The micro guidewire would not advance centrally after 3 attempts. This was secondary to shelf-like plaque formation upon contrast injection. Access removed. Hemostasis obtained with manual compression. Attention directed to the left common femoral artery site. Under sterile conditions and local anesthesia, ultrasound micropuncture access performed of the left common femoral artery. Guidewire advanced easily. Five French sheath inserted over a Bentson guidewire. The pigtail catheter was inserted into the abdominal aorta. Flush aortogram performed.  Aortogram: Diffuse calcific atherosclerosis. Negative for aneurysm. Celiac, SMA, renal arteries and IMA all appear patent. On the delayed images, there is contrast extravasation in the right abdomen.  Pigtail catheter exchanged for a C2 catheter utilized to select the SMA origin. Selective SMA angiogram performed. There is active contrast extravasation compatible with arterial bleeding from a peripheral right colic branch at the level of the hepatic flexure.  A Renegade micro catheter and micro Glidewire were utilized to select peripheral right colic branches. Selective right colic angiograms performed throughout the hepatic flexure region of the colon. A peripheral right colic branch was eventually catheterize close to the area of active bleeding on previous angiograms. This area was interrogated extensively. 70 mcg nitroglycerin was also given to provoke bleeding in the region. Despite this, no definite active bleeding was  demonstrated. However when correlating with the prior study this was close to the original extravasation site. To decrease arterial pressure in the marginal branches, one 3 mm x 6 mm interlock coil was deployed within a peripheral right colic marginal branch.  Micro catheter was retracted. A second right colic branch was catheterized directed to the  hepatic flexure. Angiogram of this area demonstrated collateral communication through marginal branches. From a peripheral right colic branch, active bleeding was still present.  The micro catheter was retracted and advanced into the active bleeding right colic branch over a Glidewire. Access was advanced peripheral within the right colic suspicious branch. Selective angiogram within this third right colic branch confirmed active bleeding within the hepatic flexure of the colon with contrast opacifying the colonic lumen.  From this peripheral location, one 2 mm x 3 mm Interlock coil and two 2 mm x 4 cm interlock coils were deployed in the peripheral right colic branch. Following this, several repeat post embolization angiograms were repeated over approximately 10 min. No further active bleeding was demonstrated.  Catheter removed. Hemostasis obtained with a 6 [place] Starclose device. No immediate complication. Patient tolerated the procedure well.  IMPRESSION: Initial SMA angiogram confirms contrast extravasation from a peripheral right colic branch into the hepatic flexure of the colon compatible with arterial bleeding. This correlates with the nuclear medicine scan.  Peripheral micro catheter access was successful at accessing the peripheral SMA right colic branch to perform micro coil embolization. Post embolization, no further active bleeding could be demonstrated.   Electronically Signed   By: Daryll Brod M.D.   On: 04/29/2013 15:52    Assessment/Plan: GI bleed s/p successful peripheral SMA rt colic branch 52m and 334mcoil embolization. H/H stable, no  continued bleeding. Will report to Dr. ShAnnamaria Boots LOS: 4 days    MOHedy JacobA-C 04/29/2013 4:04 PM

## 2013-04-30 DIAGNOSIS — M129 Arthropathy, unspecified: Secondary | ICD-10-CM

## 2013-04-30 DIAGNOSIS — I509 Heart failure, unspecified: Secondary | ICD-10-CM

## 2013-04-30 DIAGNOSIS — I5030 Unspecified diastolic (congestive) heart failure: Secondary | ICD-10-CM

## 2013-04-30 DIAGNOSIS — M25559 Pain in unspecified hip: Secondary | ICD-10-CM

## 2013-04-30 DIAGNOSIS — K5731 Diverticulosis of large intestine without perforation or abscess with bleeding: Secondary | ICD-10-CM

## 2013-04-30 DIAGNOSIS — R578 Other shock: Secondary | ICD-10-CM

## 2013-04-30 DIAGNOSIS — D62 Acute posthemorrhagic anemia: Secondary | ICD-10-CM

## 2013-04-30 DIAGNOSIS — M25551 Pain in right hip: Secondary | ICD-10-CM | POA: Diagnosis present

## 2013-04-30 LAB — CBC WITH DIFFERENTIAL/PLATELET
Basophils Absolute: 0 10*3/uL (ref 0.0–0.1)
Basophils Relative: 0 % (ref 0–1)
Eosinophils Absolute: 0.1 10*3/uL (ref 0.0–0.7)
Eosinophils Relative: 1 % (ref 0–5)
HEMATOCRIT: 21.3 % — AB (ref 39.0–52.0)
HEMOGLOBIN: 7.7 g/dL — AB (ref 13.0–17.0)
Lymphocytes Relative: 9 % — ABNORMAL LOW (ref 12–46)
Lymphs Abs: 0.9 10*3/uL (ref 0.7–4.0)
MCH: 32 pg (ref 26.0–34.0)
MCHC: 36.2 g/dL — ABNORMAL HIGH (ref 30.0–36.0)
MCV: 88.4 fL (ref 78.0–100.0)
MONO ABS: 0.7 10*3/uL (ref 0.1–1.0)
MONOS PCT: 7 % (ref 3–12)
NEUTROS ABS: 8.7 10*3/uL — AB (ref 1.7–7.7)
Neutrophils Relative %: 84 % — ABNORMAL HIGH (ref 43–77)
Platelets: 89 10*3/uL — ABNORMAL LOW (ref 150–400)
RBC: 2.41 MIL/uL — ABNORMAL LOW (ref 4.22–5.81)
RDW: 16.7 % — AB (ref 11.5–15.5)
WBC: 10.4 10*3/uL (ref 4.0–10.5)

## 2013-04-30 LAB — COMPREHENSIVE METABOLIC PANEL
ALBUMIN: 2 g/dL — AB (ref 3.5–5.2)
ALK PHOS: 33 U/L — AB (ref 39–117)
ALT: 27 U/L (ref 0–53)
AST: 40 U/L — AB (ref 0–37)
BUN: 24 mg/dL — ABNORMAL HIGH (ref 6–23)
CALCIUM: 7.2 mg/dL — AB (ref 8.4–10.5)
CO2: 22 mEq/L (ref 19–32)
Chloride: 107 mEq/L (ref 96–112)
Creatinine, Ser: 0.85 mg/dL (ref 0.50–1.35)
GFR calc Af Amer: 88 mL/min — ABNORMAL LOW (ref >90)
GFR calc non Af Amer: 76 mL/min — ABNORMAL LOW (ref >90)
GLUCOSE: 179 mg/dL — AB (ref 70–99)
POTASSIUM: 3.5 meq/L — AB (ref 3.7–5.3)
SODIUM: 140 meq/L (ref 137–147)
Total Bilirubin: 0.5 mg/dL (ref 0.3–1.2)
Total Protein: 4 g/dL — ABNORMAL LOW (ref 6.0–8.3)

## 2013-04-30 LAB — MAGNESIUM: Magnesium: 2.1 mg/dL (ref 1.5–2.5)

## 2013-04-30 LAB — BASIC METABOLIC PANEL
BUN: 28 mg/dL — ABNORMAL HIGH (ref 6–23)
CO2: 20 mEq/L (ref 19–32)
CREATININE: 0.91 mg/dL (ref 0.50–1.35)
Calcium: 7.1 mg/dL — ABNORMAL LOW (ref 8.4–10.5)
Chloride: 108 mEq/L (ref 96–112)
GFR, EST AFRICAN AMERICAN: 85 mL/min — AB (ref >90)
GFR, EST NON AFRICAN AMERICAN: 73 mL/min — AB (ref >90)
GLUCOSE: 182 mg/dL — AB (ref 70–99)
POTASSIUM: 3.6 meq/L — AB (ref 3.7–5.3)
Sodium: 139 mEq/L (ref 137–147)

## 2013-04-30 LAB — CBC
HEMATOCRIT: 20.3 % — AB (ref 39.0–52.0)
Hemoglobin: 7.4 g/dL — ABNORMAL LOW (ref 13.0–17.0)
MCH: 32.2 pg (ref 26.0–34.0)
MCHC: 36.5 g/dL — ABNORMAL HIGH (ref 30.0–36.0)
MCV: 88.3 fL (ref 78.0–100.0)
PLATELETS: 87 10*3/uL — AB (ref 150–400)
RBC: 2.3 MIL/uL — ABNORMAL LOW (ref 4.22–5.81)
RDW: 16.7 % — ABNORMAL HIGH (ref 11.5–15.5)
WBC: 10.3 10*3/uL (ref 4.0–10.5)

## 2013-04-30 LAB — PREPARE RBC (CROSSMATCH)

## 2013-04-30 MED ORDER — METOPROLOL TARTRATE 12.5 MG HALF TABLET
12.5000 mg | ORAL_TABLET | Freq: Two times a day (BID) | ORAL | Status: DC
  Administered 2013-04-30 – 2013-05-05 (×10): 12.5 mg via ORAL
  Filled 2013-04-30 (×11): qty 1

## 2013-04-30 MED ORDER — TRAMADOL HCL 50 MG PO TABS
50.0000 mg | ORAL_TABLET | Freq: Four times a day (QID) | ORAL | Status: DC | PRN
  Administered 2013-04-30 – 2013-05-05 (×15): 50 mg via ORAL
  Filled 2013-04-30 (×17): qty 1

## 2013-04-30 MED ORDER — POTASSIUM CHLORIDE CRYS ER 20 MEQ PO TBCR
40.0000 meq | EXTENDED_RELEASE_TABLET | ORAL | Status: AC
  Administered 2013-04-30 (×2): 40 meq via ORAL
  Filled 2013-04-30 (×2): qty 2

## 2013-04-30 MED ORDER — POTASSIUM CHLORIDE CRYS ER 20 MEQ PO TBCR
40.0000 meq | EXTENDED_RELEASE_TABLET | Freq: Once | ORAL | Status: AC
  Administered 2013-04-30: 40 meq via ORAL
  Filled 2013-04-30: qty 2

## 2013-04-30 MED ORDER — PANTOPRAZOLE SODIUM 40 MG PO TBEC
40.0000 mg | DELAYED_RELEASE_TABLET | Freq: Every day | ORAL | Status: DC
  Administered 2013-05-01 – 2013-05-05 (×5): 40 mg via ORAL
  Filled 2013-04-30 (×5): qty 1

## 2013-04-30 MED ORDER — LACTATED RINGERS IV SOLN
INTRAVENOUS | Status: DC
  Administered 2013-04-30: via INTRAVENOUS

## 2013-04-30 MED ORDER — SODIUM CHLORIDE 0.9 % IV BOLUS (SEPSIS): 500.0000 mL | Freq: Once | INTRAVENOUS | Status: DC

## 2013-04-30 NOTE — Progress Notes (Addendum)
Pt having frequent runs of VT 5-10 beat with each occurrence. Pt resting in bed without symptoms. See flowsheet for vital signs. Dr. Haroldine Laws notified and orders received. Will continue to monitor.

## 2013-04-30 NOTE — Progress Notes (Signed)
Rehab Admissions Coordinator Note:  Patient was screened by Cleatrice Burke for appropriateness for an Inpatient Acute Rehab Consult per PT recommendations.  At this time, we are recommending Inpatient Rehab consult if you feel appropriate.  Cleatrice Burke 04/30/2013, 3:29 PM  I can be reached at [phone].

## 2013-04-30 NOTE — Progress Notes (Addendum)
Daily Rounding Note  04/30/2013, 11:01 AM  LOS: 5 days   SUBJECTIVE:       No bloody BMs.  Last transfusion 3/11. No nausea.  Tolerating solids.  Tends to constipation. Using Advil 400 mg at HS and occasional BC/Goodies several times a month.  OBJECTIVE:         Vital signs in last 24 hours:    Temp:  [98 F (36.7 C)-99 F (37.2 C)] 98.9 F (37.2 C) (03/13 0400) Pulse Rate:  [33-118] 33 (03/13 0900) Resp:  [18-26] 18 (03/13 0800) BP: (71-126)/(13-94) 112/46 mmHg (03/13 0900) SpO2:  [82 %-100 %] 82 % (03/13 0900) Last BM Date: 04/29/13 General: looks better, still frail looking   Heart: rrr Chest: clear bil.  No dyspnea or cough Abdomen: soft, no tenderness  Extremities: no CCE Neuro/Psych:  Pleasant, no confusion.  Moves all 4s   Intake/Output from previous day: 03/12 0701 - 03/13 0700 In: 2352.4 [I.V.:1946.4; IV Piggyback:406] Out: 351 [Urine:351]  Intake/Output this shift:    Lab Results:  Recent Labs  04/29/13 1600 04/29/13 2334 04/30/13 0500  WBC 10.2 10.3 10.4  HGB 7.8* 7.4* 7.7*  HCT 21.4* 20.3* 21.3*  PLT 84* 87* 89*   BMET  Recent Labs  04/29/13 0456 04/29/13 1355 04/29/13 2221 04/30/13 0500  NA 142  --  139 140  K 3.4* 3.3* 3.6* 3.5*  CL 112  --  108 107  CO2 14*  --  20 22  GLUCOSE 174*  --  182* 179*  BUN 48*  --  28* 24*  CREATININE 0.99  --  0.91 0.85  CALCIUM 7.5*  --  7.1* 7.2*   LFT  Recent Labs  04/29/13 0456 04/30/13 0500  PROT 4.1* 4.0*  ALBUMIN 2.3* 2.0*  AST 71* 40*  ALT 30 27  ALKPHOS 35* 33*  BILITOT 0.4 0.5   PT/INR  Recent Labs  04/27/13 1600  LABPROT 15.3*  INR 1.24   Hepatitis Panel No results found for this basename: HEPBSAG, HCVAB, HEPAIGM, HEPBIGM,  in the last 72 hours  Studies/Results: Nm Gi Blood Loss 04/28/2013     IMPRESSION Abnormal GI bleeding exam demonstrating what appears to be small bowel localization of tracer in the upper  mid abdomen in right upper quadrant, question duodenal versus proximal jejunal origin.  No definite colonic localization of labeled erythrocytes is identified.  SIGNATURE  Electronically Signed   By: Lavonia Dana M.D.   On: 04/28/2013 13:14   Ir Angiogram Follow Up Study Ir US Guide Vasc Access Left Ir Embo Art  [place] Guide Roadmapping 04/29/2013     IMPRESSION: Initial SMA angiogram confirms contrast extravasation from a peripheral right colic branch into the hepatic flexure of the colon compatible with arterial bleeding. This correlates with the nuclear medicine scan.  Peripheral micro catheter access was successful at accessing the peripheral SMA right colic branch to perform micro coil embolization. Post embolization, no further active bleeding could be demonstrated.   Electronically Signed   By: Daryll Brod M.D.   On: 04/29/2013 15:52    ASSESMENT:   * GI bleed. At this point looks like it was a diverticular bleed  EGD 3/10: Small HH, no blood. No source for bleeding  NM RBC scan 3/11 bleeding tracer at RUQ/?jejunal location  Enteroscopy 3/11 past lig of treitz: No bleeding or lesions. endoclip place to help with location on future imaging and interventions.  Angiogram 3/11 with coil embolization of right colic branch of SMA.  On once daily IV Protonix. Started on clears.  * Non STEMI in setting of GIB, ABL with demand ischemia. No heparin, ASA, beta blocker due to GI bleed/shock. Work up on [employer] to GIB * ABL anemia. Relatively stable since embolization. 7 units PRBCs thus far. Last on 3/11. Dr Radford Pax suggests keeping Hgb 9 or more.  * Azotemia, improved after resolution of UGIB    PLAN   *  Transfuse as suggested by cardiologist?  Will let attending decide on this but would agree that one or 2 units would be appropriate.      Philip Simpson  04/30/2013, 11:01 AM Pager: [phone] Addendum:  Attending MD note:   I have taken a history, , and reviewed the  chart. I agree with the Advanced Practitioner's impression and recommendations. Hemodynamically stable. Will sigh off.Pt not a candidate for colonoscopy at this time.  [facility] Pager # [phone]

## 2013-04-30 NOTE — Progress Notes (Signed)
2 Days Post-Op  Subjective: No complaints  Objective: Vital signs in last 24 hours: Temp:  [98 F (36.7 C)-99 F (37.2 C)] 98.9 F (37.2 C) (03/13 0400) Pulse Rate:  [58-118] 118 (03/13 0600) Resp:  [22-26] 26 (03/13 0400) BP: (71-126)/(13-110) 116/46 mmHg (03/13 0600) SpO2:  [89 %-100 %] 89 % (03/13 0600) Last BM Date: 04/29/13  Intake/Output from previous day: 03/12 0701 - 03/13 0700 In: 2352.4 [I.V.:1946.4; IV Piggyback:406] Out: 351 [Urine:351] Intake/Output this shift:    Abdomen soft, non tender  Lab Results:   Recent Labs  04/29/13 2334 04/30/13 0500  WBC 10.3 10.4  HGB 7.4* 7.7*  HCT 20.3* 21.3*  PLT 87* 89*   BMET  Recent Labs  04/29/13 2221 04/30/13 0500  NA 139 140  K 3.6* 3.5*  CL 108 107  CO2 20 22  GLUCOSE 182* 179*  BUN 28* 24*  CREATININE 0.91 0.85  CALCIUM 7.1* 7.2*   PT/INR  Recent Labs  04/27/13 1600  LABPROT 15.3*  INR 1.24   ABG  Recent Labs  04/28/13 0357  PHART 7.396  HCO3 15.6*    Studies/Results: Nm Gi Blood Loss  04/28/2013   CLINICAL DATA Hematochezia  EXAM NUCLEAR MEDICINE GASTROINTESTINAL BLEEDING SCAN  TECHNIQUE Sequential abdominal images were obtained following intravenous administration of Tc-39mlabeled red blood cells.  RADIOPHARMACEUTICALS 25 MILLI CURIE ULTRATAG TECHNETIUM TC 36M-LABELED RED BLOOD CELLS IV KIT  COMPARISON None  FINDINGS Abnormal red cell localization is identified in the right upper quadrant beginning at 31 min.  This abnormal tracer continues through multiple loops of bowel in the mid abdomen and right upper quadrant.  This probably represents a duodenal or proximal jejunal bleeding source.  No tracer is seen along the expected course of the colon.  IMPRESSION Abnormal GI bleeding exam demonstrating what appears to be small bowel localization of tracer in the upper mid abdomen in right upper quadrant, question duodenal versus proximal jejunal origin.  No definite colonic localization of  labeled erythrocytes is identified.  SIGNATURE  Electronically Signed   By: MLavonia DanaM.D.   On: 04/28/2013 13:14   Ir Angiogram Visceral Selective  04/29/2013   CLINICAL DATA:  Lower GI acute bleeding requiring blood transfusions, hypotensive, tachycardic, upper endoscopy negative, nuclear medicine bleeding scan positive for right colonic bleeding  EXAM: ULTRASOUND GUIDANCE VASCULAR ACCESS  ABDOMINAL FLUSH AORTOGRAM  SELECTIVE SMA ANGIOGRAM  SELECTIVE PERIPHERAL SMA RIGHT COLIC BRANCH MICRO CATHETERIZATION AND COIL EMBOLIZATION OF ACUTE ARTERIAL BLEED  Date:  3/12/20153/01/2014 12:19 AM  Radiologist:  MJerilynn Mages TDaryll Brod MD  Guidance:  Ultrasound and fluoroscopic  FLUOROSCOPY TIME:  26 min 6 seconds  MEDICATIONS AND MEDICAL HISTORY: 2.5 mg Versed, 75 mcg fentanyl  ANESTHESIA/SEDATION: 110 min  CONTRAST:  1147mOMNIPAQUE IOHEXOL 300 MG/ML  SOLN  COMPLICATIONS: No immediate  PROCEDURE: Informed consent was obtained from the patient following explanation of the procedure, risks, benefits and alternatives. The patient understands, agrees and consents for the procedure. All questions were addressed. A time out was performed.  Maximal barrier sterile technique utilized including caps, mask, sterile gowns, sterile gloves, large sterile drape, hand hygiene, and Betadine.  Initially, under sterile conditions and local anesthesia, ultrasound right common femoral artery micropuncture access was performed. The micro guidewire would not advance centrally after 3 attempts. This was secondary to shelf-like plaque formation upon contrast injection. Access removed. Hemostasis obtained with manual compression. Attention directed to the left common femoral artery site. Under sterile conditions and local anesthesia, ultrasound micropuncture  access performed of the left common femoral artery. Guidewire advanced easily. Five French sheath inserted over a Bentson guidewire. The pigtail catheter was inserted into the abdominal aorta.  Flush aortogram performed.  Aortogram: Diffuse calcific atherosclerosis. Negative for aneurysm. Celiac, SMA, renal arteries and IMA all appear patent. On the delayed images, there is contrast extravasation in the right abdomen.  Pigtail catheter exchanged for a C2 catheter utilized to select the SMA origin. Selective SMA angiogram performed. There is active contrast extravasation compatible with arterial bleeding from a peripheral right colic branch at the level of the hepatic flexure.  A Renegade micro catheter and micro Glidewire were utilized to select peripheral right colic branches. Selective right colic angiograms performed throughout the hepatic flexure region of the colon. A peripheral right colic branch was eventually catheterize close to the area of active bleeding on previous angiograms. This area was interrogated extensively. 70 mcg nitroglycerin was also given to provoke bleeding in the region. Despite this, no definite active bleeding was demonstrated. However when correlating with the prior study this was close to the original extravasation site. To decrease arterial pressure in the marginal branches, one 3 mm x 6 mm interlock coil was deployed within a peripheral right colic marginal branch.  Micro catheter was retracted. A second right colic branch was catheterized directed to the hepatic flexure. Angiogram of this area demonstrated collateral communication through marginal branches. From a peripheral right colic branch, active bleeding was still present.  The micro catheter was retracted and advanced into the active bleeding right colic branch over a Glidewire. Access was advanced peripheral within the right colic suspicious branch. Selective angiogram within this third right colic branch confirmed active bleeding within the hepatic flexure of the colon with contrast opacifying the colonic lumen.  From this peripheral location, one 2 mm x 3 mm Interlock coil and two 2 mm x 4 cm interlock coils were  deployed in the peripheral right colic branch. Following this, several repeat post embolization angiograms were repeated over approximately 10 min. No further active bleeding was demonstrated.  Catheter removed. Hemostasis obtained with a 6 [place] Starclose device. No immediate complication. Patient tolerated the procedure well.  IMPRESSION: Initial SMA angiogram confirms contrast extravasation from a peripheral right colic branch into the hepatic flexure of the colon compatible with arterial bleeding. This correlates with the nuclear medicine scan.  Peripheral micro catheter access was successful at accessing the peripheral SMA right colic branch to perform micro coil embolization. Post embolization, no further active bleeding could be demonstrated.   Electronically Signed   By: Daryll Brod M.D.   On: 04/29/2013 15:52   Ir Angiogram Selective Each Additional Vessel  04/29/2013   CLINICAL DATA:  Lower GI acute bleeding requiring blood transfusions, hypotensive, tachycardic, upper endoscopy negative, nuclear medicine bleeding scan positive for right colonic bleeding  EXAM: ULTRASOUND GUIDANCE VASCULAR ACCESS  ABDOMINAL FLUSH AORTOGRAM  SELECTIVE SMA ANGIOGRAM  SELECTIVE PERIPHERAL SMA RIGHT COLIC BRANCH MICRO CATHETERIZATION AND COIL EMBOLIZATION OF ACUTE ARTERIAL BLEED  Date:  3/12/20153/01/2014 12:19 AM  Radiologist:  Jerilynn Mages. Daryll Brod, MD  Guidance:  Ultrasound and fluoroscopic  FLUOROSCOPY TIME:  26 min 6 seconds  MEDICATIONS AND MEDICAL HISTORY: 2.5 mg Versed, 75 mcg fentanyl  ANESTHESIA/SEDATION: 110 min  CONTRAST:  155m OMNIPAQUE IOHEXOL 300 MG/ML  SOLN  COMPLICATIONS: No immediate  PROCEDURE: Informed consent was obtained from the patient following explanation of the procedure, risks, benefits and alternatives. The patient understands, agrees and consents for the procedure. All questions  were addressed. A time out was performed.  Maximal barrier sterile technique utilized including caps, mask, sterile  gowns, sterile gloves, large sterile drape, hand hygiene, and Betadine.  Initially, under sterile conditions and local anesthesia, ultrasound right common femoral artery micropuncture access was performed. The micro guidewire would not advance centrally after 3 attempts. This was secondary to shelf-like plaque formation upon contrast injection. Access removed. Hemostasis obtained with manual compression. Attention directed to the left common femoral artery site. Under sterile conditions and local anesthesia, ultrasound micropuncture access performed of the left common femoral artery. Guidewire advanced easily. Five French sheath inserted over a Bentson guidewire. The pigtail catheter was inserted into the abdominal aorta. Flush aortogram performed.  Aortogram: Diffuse calcific atherosclerosis. Negative for aneurysm. Celiac, SMA, renal arteries and IMA all appear patent. On the delayed images, there is contrast extravasation in the right abdomen.  Pigtail catheter exchanged for a C2 catheter utilized to select the SMA origin. Selective SMA angiogram performed. There is active contrast extravasation compatible with arterial bleeding from a peripheral right colic branch at the level of the hepatic flexure.  A Renegade micro catheter and micro Glidewire were utilized to select peripheral right colic branches. Selective right colic angiograms performed throughout the hepatic flexure region of the colon. A peripheral right colic branch was eventually catheterize close to the area of active bleeding on previous angiograms. This area was interrogated extensively. 70 mcg nitroglycerin was also given to provoke bleeding in the region. Despite this, no definite active bleeding was demonstrated. However when correlating with the prior study this was close to the original extravasation site. To decrease arterial pressure in the marginal branches, one 3 mm x 6 mm interlock coil was deployed within a peripheral right colic  marginal branch.  Micro catheter was retracted. A second right colic branch was catheterized directed to the hepatic flexure. Angiogram of this area demonstrated collateral communication through marginal branches. From a peripheral right colic branch, active bleeding was still present.  The micro catheter was retracted and advanced into the active bleeding right colic branch over a Glidewire. Access was advanced peripheral within the right colic suspicious branch. Selective angiogram within this third right colic branch confirmed active bleeding within the hepatic flexure of the colon with contrast opacifying the colonic lumen.  From this peripheral location, one 2 mm x 3 mm Interlock coil and two 2 mm x 4 cm interlock coils were deployed in the peripheral right colic branch. Following this, several repeat post embolization angiograms were repeated over approximately 10 min. No further active bleeding was demonstrated.  Catheter removed. Hemostasis obtained with a 6 [place] Starclose device. No immediate complication. Patient tolerated the procedure well.  IMPRESSION: Initial SMA angiogram confirms contrast extravasation from a peripheral right colic branch into the hepatic flexure of the colon compatible with arterial bleeding. This correlates with the nuclear medicine scan.  Peripheral micro catheter access was successful at accessing the peripheral SMA right colic branch to perform micro coil embolization. Post embolization, no further active bleeding could be demonstrated.   Electronically Signed   By: Daryll Brod M.D.   On: 04/29/2013 15:52   Ir Angiogram Follow Up Study  04/29/2013   CLINICAL DATA:  Lower GI acute bleeding requiring blood transfusions, hypotensive, tachycardic, upper endoscopy negative, nuclear medicine bleeding scan positive for right colonic bleeding  EXAM: ULTRASOUND GUIDANCE VASCULAR ACCESS  ABDOMINAL FLUSH AORTOGRAM  SELECTIVE SMA ANGIOGRAM  SELECTIVE PERIPHERAL SMA RIGHT COLIC BRANCH  MICRO CATHETERIZATION AND COIL EMBOLIZATION OF  ACUTE ARTERIAL BLEED  Date:  3/12/20153/01/2014 12:19 AM  Radiologist:  Jerilynn Mages. Daryll Brod, MD  Guidance:  Ultrasound and fluoroscopic  FLUOROSCOPY TIME:  26 min 6 seconds  MEDICATIONS AND MEDICAL HISTORY: 2.5 mg Versed, 75 mcg fentanyl  ANESTHESIA/SEDATION: 110 min  CONTRAST:  1106m OMNIPAQUE IOHEXOL 300 MG/ML  SOLN  COMPLICATIONS: No immediate  PROCEDURE: Informed consent was obtained from the patient following explanation of the procedure, risks, benefits and alternatives. The patient understands, agrees and consents for the procedure. All questions were addressed. A time out was performed.  Maximal barrier sterile technique utilized including caps, mask, sterile gowns, sterile gloves, large sterile drape, hand hygiene, and Betadine.  Initially, under sterile conditions and local anesthesia, ultrasound right common femoral artery micropuncture access was performed. The micro guidewire would not advance centrally after 3 attempts. This was secondary to shelf-like plaque formation upon contrast injection. Access removed. Hemostasis obtained with manual compression. Attention directed to the left common femoral artery site. Under sterile conditions and local anesthesia, ultrasound micropuncture access performed of the left common femoral artery. Guidewire advanced easily. Five French sheath inserted over a Bentson guidewire. The pigtail catheter was inserted into the abdominal aorta. Flush aortogram performed.  Aortogram: Diffuse calcific atherosclerosis. Negative for aneurysm. Celiac, SMA, renal arteries and IMA all appear patent. On the delayed images, there is contrast extravasation in the right abdomen.  Pigtail catheter exchanged for a C2 catheter utilized to select the SMA origin. Selective SMA angiogram performed. There is active contrast extravasation compatible with arterial bleeding from a peripheral right colic branch at the level of the hepatic flexure.  A  Renegade micro catheter and micro Glidewire were utilized to select peripheral right colic branches. Selective right colic angiograms performed throughout the hepatic flexure region of the colon. A peripheral right colic branch was eventually catheterize close to the area of active bleeding on previous angiograms. This area was interrogated extensively. 70 mcg nitroglycerin was also given to provoke bleeding in the region. Despite this, no definite active bleeding was demonstrated. However when correlating with the prior study this was close to the original extravasation site. To decrease arterial pressure in the marginal branches, one 3 mm x 6 mm interlock coil was deployed within a peripheral right colic marginal branch.  Micro catheter was retracted. A second right colic branch was catheterized directed to the hepatic flexure. Angiogram of this area demonstrated collateral communication through marginal branches. From a peripheral right colic branch, active bleeding was still present.  The micro catheter was retracted and advanced into the active bleeding right colic branch over a Glidewire. Access was advanced peripheral within the right colic suspicious branch. Selective angiogram within this third right colic branch confirmed active bleeding within the hepatic flexure of the colon with contrast opacifying the colonic lumen.  From this peripheral location, one 2 mm x 3 mm Interlock coil and two 2 mm x 4 cm interlock coils were deployed in the peripheral right colic branch. Following this, several repeat post embolization angiograms were repeated over approximately 10 min. No further active bleeding was demonstrated.  Catheter removed. Hemostasis obtained with a 6 [place] device. No immediate complication. Patient tolerated the procedure well.  IMPRESSION: Initial SMA angiogram confirms contrast extravasation from a peripheral right colic branch into the hepatic flexure of the colon compatible with  arterial bleeding. This correlates with the nuclear medicine scan.  Peripheral micro catheter access was successful at accessing the peripheral SMA right colic branch to perform micro coil embolization.  Post embolization, no further active bleeding could be demonstrated.   Electronically Signed   By: Daryll Brod M.D.   On: 04/29/2013 15:52   Ir US Guide Vasc Access Left  04/29/2013   CLINICAL DATA:  Lower GI acute bleeding requiring blood transfusions, hypotensive, tachycardic, upper endoscopy negative, nuclear medicine bleeding scan positive for right colonic bleeding  EXAM: ULTRASOUND GUIDANCE VASCULAR ACCESS  ABDOMINAL FLUSH AORTOGRAM  SELECTIVE SMA ANGIOGRAM  SELECTIVE PERIPHERAL SMA RIGHT COLIC BRANCH MICRO CATHETERIZATION AND COIL EMBOLIZATION OF ACUTE ARTERIAL BLEED  Date:  3/12/20153/01/2014 12:19 AM  Radiologist:  Jerilynn Mages. Daryll Brod, MD  Guidance:  Ultrasound and fluoroscopic  FLUOROSCOPY TIME:  26 min 6 seconds  MEDICATIONS AND MEDICAL HISTORY: 2.5 mg Versed, 75 mcg fentanyl  ANESTHESIA/SEDATION: 110 min  CONTRAST:  171m OMNIPAQUE IOHEXOL 300 MG/ML  SOLN  COMPLICATIONS: No immediate  PROCEDURE: Informed consent was obtained from the patient following explanation of the procedure, risks, benefits and alternatives. The patient understands, agrees and consents for the procedure. All questions were addressed. A time out was performed.  Maximal barrier sterile technique utilized including caps, mask, sterile gowns, sterile gloves, large sterile drape, hand hygiene, and Betadine.  Initially, under sterile conditions and local anesthesia, ultrasound right common femoral artery micropuncture access was performed. The micro guidewire would not advance centrally after 3 attempts. This was secondary to shelf-like plaque formation upon contrast injection. Access removed. Hemostasis obtained with manual compression. Attention directed to the left common femoral artery site. Under sterile conditions and local  anesthesia, ultrasound micropuncture access performed of the left common femoral artery. Guidewire advanced easily. Five French sheath inserted over a Bentson guidewire. The pigtail catheter was inserted into the abdominal aorta. Flush aortogram performed.  Aortogram: Diffuse calcific atherosclerosis. Negative for aneurysm. Celiac, SMA, renal arteries and IMA all appear patent. On the delayed images, there is contrast extravasation in the right abdomen.  Pigtail catheter exchanged for a C2 catheter utilized to select the SMA origin. Selective SMA angiogram performed. There is active contrast extravasation compatible with arterial bleeding from a peripheral right colic branch at the level of the hepatic flexure.  A Renegade micro catheter and micro Glidewire were utilized to select peripheral right colic branches. Selective right colic angiograms performed throughout the hepatic flexure region of the colon. A peripheral right colic branch was eventually catheterize close to the area of active bleeding on previous angiograms. This area was interrogated extensively. 70 mcg nitroglycerin was also given to provoke bleeding in the region. Despite this, no definite active bleeding was demonstrated. However when correlating with the prior study this was close to the original extravasation site. To decrease arterial pressure in the marginal branches, one 3 mm x 6 mm interlock coil was deployed within a peripheral right colic marginal branch.  Micro catheter was retracted. A second right colic branch was catheterized directed to the hepatic flexure. Angiogram of this area demonstrated collateral communication through marginal branches. From a peripheral right colic branch, active bleeding was still present.  The micro catheter was retracted and advanced into the active bleeding right colic branch over a Glidewire. Access was advanced peripheral within the right colic suspicious branch. Selective angiogram within this third  right colic branch confirmed active bleeding within the hepatic flexure of the colon with contrast opacifying the colonic lumen.  From this peripheral location, one 2 mm x 3 mm Interlock coil and two 2 mm x 4 cm interlock coils were deployed in the peripheral right colic branch. Following this,  several repeat post embolization angiograms were repeated over approximately 10 min. No further active bleeding was demonstrated.  Catheter removed. Hemostasis obtained with a 6 [place] Starclose device. No immediate complication. Patient tolerated the procedure well.  IMPRESSION: Initial SMA angiogram confirms contrast extravasation from a peripheral right colic branch into the hepatic flexure of the colon compatible with arterial bleeding. This correlates with the nuclear medicine scan.  Peripheral micro catheter access was successful at accessing the peripheral SMA right colic branch to perform micro coil embolization. Post embolization, no further active bleeding could be demonstrated.   Electronically Signed   By: Daryll Brod M.D.   On: 04/29/2013 15:52   [place] Guide Roadmapping  04/29/2013   CLINICAL DATA:  Lower GI acute bleeding requiring blood transfusions, hypotensive, tachycardic, upper endoscopy negative, nuclear medicine bleeding scan positive for right colonic bleeding  EXAM: ULTRASOUND GUIDANCE VASCULAR ACCESS  ABDOMINAL FLUSH AORTOGRAM  SELECTIVE SMA ANGIOGRAM  SELECTIVE PERIPHERAL SMA RIGHT COLIC BRANCH MICRO CATHETERIZATION AND COIL EMBOLIZATION OF ACUTE ARTERIAL BLEED  Date:  3/12/20153/01/2014 12:19 AM  Radiologist:  Jerilynn Mages. Daryll Brod, MD  Guidance:  Ultrasound and fluoroscopic  FLUOROSCOPY TIME:  26 min 6 seconds  MEDICATIONS AND MEDICAL HISTORY: 2.5 mg Versed, 75 mcg fentanyl  ANESTHESIA/SEDATION: 110 min  CONTRAST:  138m OMNIPAQUE IOHEXOL 300 MG/ML  SOLN  COMPLICATIONS: No immediate  PROCEDURE: Informed consent was obtained from the patient following explanation of  the procedure, risks, benefits and alternatives. The patient understands, agrees and consents for the procedure. All questions were addressed. A time out was performed.  Maximal barrier sterile technique utilized including caps, mask, sterile gowns, sterile gloves, large sterile drape, hand hygiene, and Betadine.  Initially, under sterile conditions and local anesthesia, ultrasound right common femoral artery micropuncture access was performed. The micro guidewire would not advance centrally after 3 attempts. This was secondary to shelf-like plaque formation upon contrast injection. Access removed. Hemostasis obtained with manual compression. Attention directed to the left common femoral artery site. Under sterile conditions and local anesthesia, ultrasound micropuncture access performed of the left common femoral artery. Guidewire advanced easily. Five French sheath inserted over a Bentson guidewire. The pigtail catheter was inserted into the abdominal aorta. Flush aortogram performed.  Aortogram: Diffuse calcific atherosclerosis. Negative for aneurysm. Celiac, SMA, renal arteries and IMA all appear patent. On the delayed images, there is contrast extravasation in the right abdomen.  Pigtail catheter exchanged for a C2 catheter utilized to select the SMA origin. Selective SMA angiogram performed. There is active contrast extravasation compatible with arterial bleeding from a peripheral right colic branch at the level of the hepatic flexure.  A Renegade micro catheter and micro Glidewire were utilized to select peripheral right colic branches. Selective right colic angiograms performed throughout the hepatic flexure region of the colon. A peripheral right colic branch was eventually catheterize close to the area of active bleeding on previous angiograms. This area was interrogated extensively. 70 mcg nitroglycerin was also given to provoke bleeding in the region. Despite this, no definite active bleeding was  demonstrated. However when correlating with the prior study this was close to the original extravasation site. To decrease arterial pressure in the marginal branches, one 3 mm x 6 mm interlock coil was deployed within a peripheral right colic marginal branch.  Micro catheter was retracted. A second right colic branch was catheterized directed to the hepatic flexure. Angiogram of this area demonstrated collateral communication through marginal branches. From  a peripheral right colic branch, active bleeding was still present.  The micro catheter was retracted and advanced into the active bleeding right colic branch over a Glidewire. Access was advanced peripheral within the right colic suspicious branch. Selective angiogram within this third right colic branch confirmed active bleeding within the hepatic flexure of the colon with contrast opacifying the colonic lumen.  From this peripheral location, one 2 mm x 3 mm Interlock coil and two 2 mm x 4 cm interlock coils were deployed in the peripheral right colic branch. Following this, several repeat post embolization angiograms were repeated over approximately 10 min. No further active bleeding was demonstrated.  Catheter removed. Hemostasis obtained with a 6 [place] Starclose device. No immediate complication. Patient tolerated the procedure well.  IMPRESSION: Initial SMA angiogram confirms contrast extravasation from a peripheral right colic branch into the hepatic flexure of the colon compatible with arterial bleeding. This correlates with the nuclear medicine scan.  Peripheral micro catheter access was successful at accessing the peripheral SMA right colic branch to perform micro coil embolization. Post embolization, no further active bleeding could be demonstrated.   Electronically Signed   By: Daryll Brod M.D.   On: 04/29/2013 15:52    Anti-infectives: Anti-infectives   None      Assessment/Plan: s/p Procedure(s) with comments: ENTEROSCOPY (N/A) -  bedside  Lower GI bleed  Hgb drifted down but seems to be staying relatively stable in the 7s.  No bloody BM's.  Will continue to treat conservatively  LOS: 5 days    Kobee Medlen A 04/30/2013

## 2013-04-30 NOTE — Progress Notes (Signed)
SUBJECTIVE:  Feeling better.  Now off pressors  OBJECTIVE:   Vitals:   Filed Vitals:   04/30/13 0730 04/30/13 0800 04/30/13 0830 04/30/13 0900  BP: 103/43 80/35 91/37  112/46  Pulse: 62 63 76 33  Temp:      TempSrc:      Resp:  18    Height:      Weight:      SpO2: 97% 93% 100% 82%   I&O's:   Intake/Output Summary (Last 24 hours) at 04/30/13 0926 Last data filed at 04/30/13 0600  Gross per 24 hour  Intake 2205.92 ml  Output    351 ml  Net 1854.92 ml   TELEMETRY: Reviewed telemetry pt in NSR with PAC's:     PHYSICAL EXAM General: Well developed, well nourished, in no acute distress Head: Eyes PERRLA, No xanthomas.   Normal cephalic and atramatic  Lungs:   Clear bilaterally to auscultation and percussion. Heart:   HRRR S1 S2 Pulses are 2+ & equal. Abdomen: Bowel sounds are positive, abdomen soft and non-tender without masses Extremities:   No clubbing, cyanosis or edema.  DP +1 Neuro: Alert and oriented X 3. Psych:  Good affect, responds appropriately   LABS: Basic Metabolic Panel:  Recent Labs  04/29/13 0456  04/29/13 2221 04/30/13 0500  NA 142  --  139 140  K 3.4*  < > 3.6* 3.5*  CL 112  --  108 107  CO2 14*  --  20 22  GLUCOSE 174*  --  182* 179*  BUN 48*  --  28* 24*  CREATININE 0.99  --  0.91 0.85  CALCIUM 7.5*  --  7.1* 7.2*  MG 1.7  --   --  2.1  < > = values in this interval not displayed. Liver Function Tests:  Recent Labs  04/29/13 0456 04/30/13 0500  AST 71* 40*  ALT 30 27  ALKPHOS 35* 33*  BILITOT 0.4 0.5  PROT 4.1* 4.0*  ALBUMIN 2.3* 2.0*   No results found for this basename: LIPASE, AMYLASE,  in the last 72 hours CBC:  Recent Labs  04/29/13 0456  04/29/13 2334 04/30/13 0500  WBC 11.0*  < > 10.3 10.4  NEUTROABS 8.7*  --   --  8.7*  HGB 9.8*  < > 7.4* 7.7*  HCT 27.1*  < > 20.3* 21.3*  MCV 88.3  < > 88.3 88.4  PLT 108*  < > 87* 89*  < > = values in this interval not displayed. Cardiac Enzymes:  Recent Labs   04/28/13 1046 04/29/13 0030 04/29/13 0730  TROPONINI 15.27* 19.26* 14.25*   BNP: No components found with this basename: POCBNP,  D-Dimer: No results found for this basename: DDIMER,  in the last 72 hours Hemoglobin A1C: No results found for this basename: HGBA1C,  in the last 72 hours Fasting Lipid Panel: No results found for this basename: CHOL, HDL, LDLCALC, TRIG, CHOLHDL, LDLDIRECT,  in the last 72 hours Thyroid Function Tests: No results found for this basename: TSH, T4TOTAL, FREET3, T3FREE, THYROIDAB,  in the last 72 hours Anemia Panel: No results found for this basename: VITAMINB12, FOLATE, FERRITIN, TIBC, IRON, RETICCTPCT,  in the last 72 hours Coag Panel:   Lab Results  Component Value Date   INR 1.24 04/27/2013    RADIOLOGY: Ct Head Wo Contrast  04/25/2013   CLINICAL DATA:  Fall.  Bruising to left orbit.  EXAM: CT HEAD WITHOUT CONTRAST  CT MAXILLOFACIAL WITHOUT CONTRAST  TECHNIQUE: Multidetector CT imaging  of the head and maxillofacial structures were performed using the standard protocol without intravenous contrast. Multiplanar CT image reconstructions of the maxillofacial structures were also generated.  COMPARISON:  None.  FINDINGS: CT HEAD FINDINGS  Diffuse, extensive cerebral atrophy. Dilated extra-axial CSF spaces over the cerebral hemispheres bilaterally most likely related to the volume loss, or this could be related to chronic subdural hygromas. No hemorrhage. No mass lesion or acute infarction. No hydrocephalus.  No acute calvarial abnormality. Left orbital fractures noted. See maxillofacial report below.  CT MAXILLOFACIAL FINDINGS  Fracture through the left lateral orbital wall and orbital floor. Probable nondisplaced zygomatic arch fracture on the left. Fracture also noted posteriorly within the lateral posterior orbital wall on image 68 of series 6. This is also noted on image 71 of series 6. The fracture line continues laterally into the sphenoid wing and superiorly  into the squamosal portion of the left temporal bone.  Small amount of fluid noted in the left maxillary sinus. Remainder the paranasal sinuses are clear. Mastoids are clear. Globes are intact.  IMPRESSION: Diffuse cerebral atrophy. Prominent extra-axial low-density fluid likely related to the atrophy although these could represent chronic subdural hygromas.  No acute intracranial abnormality.  Complex left orbital fracture including anterior and posterior lateral walls, extending laterally into the sphenoid wing, and likely into the squamosal portion of the temporal bone. Left orbital floor fracture. Nondisplaced zygomatic arch fracture.   Electronically Signed   By: Rolm Baptise M.D.   On: 04/25/2013 17:32   Nm Gi Blood Loss  04/28/2013   CLINICAL DATA Hematochezia  EXAM NUCLEAR MEDICINE GASTROINTESTINAL BLEEDING SCAN  TECHNIQUE Sequential abdominal images were obtained following intravenous administration of Tc-44mlabeled red blood cells.  RADIOPHARMACEUTICALS 25 MILLI CURIE ULTRATAG TECHNETIUM TC 70M-LABELED RED BLOOD CELLS IV KIT  COMPARISON None  FINDINGS Abnormal red cell localization is identified in the right upper quadrant beginning at 31 min.  This abnormal tracer continues through multiple loops of bowel in the mid abdomen and right upper quadrant.  This probably represents a duodenal or proximal jejunal bleeding source.  No tracer is seen along the expected course of the colon.  IMPRESSION Abnormal GI bleeding exam demonstrating what appears to be small bowel localization of tracer in the upper mid abdomen in right upper quadrant, question duodenal versus proximal jejunal origin.  No definite colonic localization of labeled erythrocytes is identified.  SIGNATURE  Electronically Signed   By: MLavonia DanaM.D.   On: 04/28/2013 13:14   Ir Angiogram Visceral Selective  04/29/2013   CLINICAL DATA:  Lower GI acute bleeding requiring blood transfusions, hypotensive, tachycardic, upper endoscopy negative,  nuclear medicine bleeding scan positive for right colonic bleeding  EXAM: ULTRASOUND GUIDANCE VASCULAR ACCESS  ABDOMINAL FLUSH AORTOGRAM  SELECTIVE SMA ANGIOGRAM  SELECTIVE PERIPHERAL SMA RIGHT COLIC BRANCH MICRO CATHETERIZATION AND COIL EMBOLIZATION OF ACUTE ARTERIAL BLEED  Date:  3/12/20153/01/2014 12:19 AM  Radiologist:  MJerilynn Mages TDaryll Brod MD  Guidance:  Ultrasound and fluoroscopic  FLUOROSCOPY TIME:  26 min 6 seconds  MEDICATIONS AND MEDICAL HISTORY: 2.5 mg Versed, 75 mcg fentanyl  ANESTHESIA/SEDATION: 110 min  CONTRAST:  1191mOMNIPAQUE IOHEXOL 300 MG/ML  SOLN  COMPLICATIONS: No immediate  PROCEDURE: Informed consent was obtained from the patient following explanation of the procedure, risks, benefits and alternatives. The patient understands, agrees and consents for the procedure. All questions were addressed. A time out was performed.  Maximal barrier sterile technique utilized including caps, mask, sterile gowns, sterile gloves, large sterile drape,  hand hygiene, and Betadine.  Initially, under sterile conditions and local anesthesia, ultrasound right common femoral artery micropuncture access was performed. The micro guidewire would not advance centrally after 3 attempts. This was secondary to shelf-like plaque formation upon contrast injection. Access removed. Hemostasis obtained with manual compression. Attention directed to the left common femoral artery site. Under sterile conditions and local anesthesia, ultrasound micropuncture access performed of the left common femoral artery. Guidewire advanced easily. Five French sheath inserted over a Bentson guidewire. The pigtail catheter was inserted into the abdominal aorta. Flush aortogram performed.  Aortogram: Diffuse calcific atherosclerosis. Negative for aneurysm. Celiac, SMA, renal arteries and IMA all appear patent. On the delayed images, there is contrast extravasation in the right abdomen.  Pigtail catheter exchanged for a C2 catheter utilized to  select the SMA origin. Selective SMA angiogram performed. There is active contrast extravasation compatible with arterial bleeding from a peripheral right colic branch at the level of the hepatic flexure.  A Renegade micro catheter and micro Glidewire were utilized to select peripheral right colic branches. Selective right colic angiograms performed throughout the hepatic flexure region of the colon. A peripheral right colic branch was eventually catheterize close to the area of active bleeding on previous angiograms. This area was interrogated extensively. 70 mcg nitroglycerin was also given to provoke bleeding in the region. Despite this, no definite active bleeding was demonstrated. However when correlating with the prior study this was close to the original extravasation site. To decrease arterial pressure in the marginal branches, one 3 mm x 6 mm interlock coil was deployed within a peripheral right colic marginal branch.  Micro catheter was retracted. A second right colic branch was catheterized directed to the hepatic flexure. Angiogram of this area demonstrated collateral communication through marginal branches. From a peripheral right colic branch, active bleeding was still present.  The micro catheter was retracted and advanced into the active bleeding right colic branch over a Glidewire. Access was advanced peripheral within the right colic suspicious branch. Selective angiogram within this third right colic branch confirmed active bleeding within the hepatic flexure of the colon with contrast opacifying the colonic lumen.  From this peripheral location, one 2 mm x 3 mm Interlock coil and two 2 mm x 4 cm interlock coils were deployed in the peripheral right colic branch. Following this, several repeat post embolization angiograms were repeated over approximately 10 min. No further active bleeding was demonstrated.  Catheter removed. Hemostasis obtained with a 6 [place] Starclose device. No immediate  complication. Patient tolerated the procedure well.  IMPRESSION: Initial SMA angiogram confirms contrast extravasation from a peripheral right colic branch into the hepatic flexure of the colon compatible with arterial bleeding. This correlates with the nuclear medicine scan.  Peripheral micro catheter access was successful at accessing the peripheral SMA right colic branch to perform micro coil embolization. Post embolization, no further active bleeding could be demonstrated.   Electronically Signed   By: Daryll Brod M.D.   On: 04/29/2013 15:52   Ir Angiogram Selective Each Additional Vessel  04/29/2013   CLINICAL DATA:  Lower GI acute bleeding requiring blood transfusions, hypotensive, tachycardic, upper endoscopy negative, nuclear medicine bleeding scan positive for right colonic bleeding  EXAM: ULTRASOUND GUIDANCE VASCULAR ACCESS  ABDOMINAL FLUSH AORTOGRAM  SELECTIVE SMA ANGIOGRAM  SELECTIVE PERIPHERAL SMA RIGHT COLIC BRANCH MICRO CATHETERIZATION AND COIL EMBOLIZATION OF ACUTE ARTERIAL BLEED  Date:  3/12/20153/01/2014 12:19 AM  Radiologist:  Jerilynn Mages. Daryll Brod, MD  Guidance:  Ultrasound and fluoroscopic  FLUOROSCOPY TIME:  26 min 6 seconds  MEDICATIONS AND MEDICAL HISTORY: 2.5 mg Versed, 75 mcg fentanyl  ANESTHESIA/SEDATION: 110 min  CONTRAST:  150m OMNIPAQUE IOHEXOL 300 MG/ML  SOLN  COMPLICATIONS: No immediate  PROCEDURE: Informed consent was obtained from the patient following explanation of the procedure, risks, benefits and alternatives. The patient understands, agrees and consents for the procedure. All questions were addressed. A time out was performed.  Maximal barrier sterile technique utilized including caps, mask, sterile gowns, sterile gloves, large sterile drape, hand hygiene, and Betadine.  Initially, under sterile conditions and local anesthesia, ultrasound right common femoral artery micropuncture access was performed. The micro guidewire would not advance centrally after 3 attempts. This was  secondary to shelf-like plaque formation upon contrast injection. Access removed. Hemostasis obtained with manual compression. Attention directed to the left common femoral artery site. Under sterile conditions and local anesthesia, ultrasound micropuncture access performed of the left common femoral artery. Guidewire advanced easily. Five French sheath inserted over a Bentson guidewire. The pigtail catheter was inserted into the abdominal aorta. Flush aortogram performed.  Aortogram: Diffuse calcific atherosclerosis. Negative for aneurysm. Celiac, SMA, renal arteries and IMA all appear patent. On the delayed images, there is contrast extravasation in the right abdomen.  Pigtail catheter exchanged for a C2 catheter utilized to select the SMA origin. Selective SMA angiogram performed. There is active contrast extravasation compatible with arterial bleeding from a peripheral right colic branch at the level of the hepatic flexure.  A Renegade micro catheter and micro Glidewire were utilized to select peripheral right colic branches. Selective right colic angiograms performed throughout the hepatic flexure region of the colon. A peripheral right colic branch was eventually catheterize close to the area of active bleeding on previous angiograms. This area was interrogated extensively. 70 mcg nitroglycerin was also given to provoke bleeding in the region. Despite this, no definite active bleeding was demonstrated. However when correlating with the prior study this was close to the original extravasation site. To decrease arterial pressure in the marginal branches, one 3 mm x 6 mm interlock coil was deployed within a peripheral right colic marginal branch.  Micro catheter was retracted. A second right colic branch was catheterized directed to the hepatic flexure. Angiogram of this area demonstrated collateral communication through marginal branches. From a peripheral right colic branch, active bleeding was still present.   The micro catheter was retracted and advanced into the active bleeding right colic branch over a Glidewire. Access was advanced peripheral within the right colic suspicious branch. Selective angiogram within this third right colic branch confirmed active bleeding within the hepatic flexure of the colon with contrast opacifying the colonic lumen.  From this peripheral location, one 2 mm x 3 mm Interlock coil and two 2 mm x 4 cm interlock coils were deployed in the peripheral right colic branch. Following this, several repeat post embolization angiograms were repeated over approximately 10 min. No further active bleeding was demonstrated.  Catheter removed. Hemostasis obtained with a 6 [place] device. No immediate complication. Patient tolerated the procedure well.  IMPRESSION: Initial SMA angiogram confirms contrast extravasation from a peripheral right colic branch into the hepatic flexure of the colon compatible with arterial bleeding. This correlates with the nuclear medicine scan.  Peripheral micro catheter access was successful at accessing the peripheral SMA right colic branch to perform micro coil embolization. Post embolization, no further active bleeding could be demonstrated.   Electronically Signed   By: TDaryll BrodM.D.   On:  04/29/2013 15:52   Ir Angiogram Follow Up Study  04/29/2013   CLINICAL DATA:  Lower GI acute bleeding requiring blood transfusions, hypotensive, tachycardic, upper endoscopy negative, nuclear medicine bleeding scan positive for right colonic bleeding  EXAM: ULTRASOUND GUIDANCE VASCULAR ACCESS  ABDOMINAL FLUSH AORTOGRAM  SELECTIVE SMA ANGIOGRAM  SELECTIVE PERIPHERAL SMA RIGHT COLIC BRANCH MICRO CATHETERIZATION AND COIL EMBOLIZATION OF ACUTE ARTERIAL BLEED  Date:  3/12/20153/01/2014 12:19 AM  Radiologist:  Jerilynn Mages. Daryll Brod, MD  Guidance:  Ultrasound and fluoroscopic  FLUOROSCOPY TIME:  26 min 6 seconds  MEDICATIONS AND MEDICAL HISTORY: 2.5 mg Versed, 75 mcg fentanyl   ANESTHESIA/SEDATION: 110 min  CONTRAST:  166m OMNIPAQUE IOHEXOL 300 MG/ML  SOLN  COMPLICATIONS: No immediate  PROCEDURE: Informed consent was obtained from the patient following explanation of the procedure, risks, benefits and alternatives. The patient understands, agrees and consents for the procedure. All questions were addressed. A time out was performed.  Maximal barrier sterile technique utilized including caps, mask, sterile gowns, sterile gloves, large sterile drape, hand hygiene, and Betadine.  Initially, under sterile conditions and local anesthesia, ultrasound right common femoral artery micropuncture access was performed. The micro guidewire would not advance centrally after 3 attempts. This was secondary to shelf-like plaque formation upon contrast injection. Access removed. Hemostasis obtained with manual compression. Attention directed to the left common femoral artery site. Under sterile conditions and local anesthesia, ultrasound micropuncture access performed of the left common femoral artery. Guidewire advanced easily. Five French sheath inserted over a Bentson guidewire. The pigtail catheter was inserted into the abdominal aorta. Flush aortogram performed.  Aortogram: Diffuse calcific atherosclerosis. Negative for aneurysm. Celiac, SMA, renal arteries and IMA all appear patent. On the delayed images, there is contrast extravasation in the right abdomen.  Pigtail catheter exchanged for a C2 catheter utilized to select the SMA origin. Selective SMA angiogram performed. There is active contrast extravasation compatible with arterial bleeding from a peripheral right colic branch at the level of the hepatic flexure.  A Renegade micro catheter and micro Glidewire were utilized to select peripheral right colic branches. Selective right colic angiograms performed throughout the hepatic flexure region of the colon. A peripheral right colic branch was eventually catheterize close to the area of active  bleeding on previous angiograms. This area was interrogated extensively. 70 mcg nitroglycerin was also given to provoke bleeding in the region. Despite this, no definite active bleeding was demonstrated. However when correlating with the prior study this was close to the original extravasation site. To decrease arterial pressure in the marginal branches, one 3 mm x 6 mm interlock coil was deployed within a peripheral right colic marginal branch.  Micro catheter was retracted. A second right colic branch was catheterized directed to the hepatic flexure. Angiogram of this area demonstrated collateral communication through marginal branches. From a peripheral right colic branch, active bleeding was still present.  The micro catheter was retracted and advanced into the active bleeding right colic branch over a Glidewire. Access was advanced peripheral within the right colic suspicious branch. Selective angiogram within this third right colic branch confirmed active bleeding within the hepatic flexure of the colon with contrast opacifying the colonic lumen.  From this peripheral location, one 2 mm x 3 mm Interlock coil and two 2 mm x 4 cm interlock coils were deployed in the peripheral right colic branch. Following this, several repeat post embolization angiograms were repeated over approximately 10 min. No further active bleeding was demonstrated.  Catheter removed. Hemostasis obtained with a  6 [place] Starclose device. No immediate complication. Patient tolerated the procedure well.  IMPRESSION: Initial SMA angiogram confirms contrast extravasation from a peripheral right colic branch into the hepatic flexure of the colon compatible with arterial bleeding. This correlates with the nuclear medicine scan.  Peripheral micro catheter access was successful at accessing the peripheral SMA right colic branch to perform micro coil embolization. Post embolization, no further active bleeding could be demonstrated.    Electronically Signed   By: Daryll Brod M.D.   On: 04/29/2013 15:52   Ir US Guide Vasc Access Left  04/29/2013   CLINICAL DATA:  Lower GI acute bleeding requiring blood transfusions, hypotensive, tachycardic, upper endoscopy negative, nuclear medicine bleeding scan positive for right colonic bleeding  EXAM: ULTRASOUND GUIDANCE VASCULAR ACCESS  ABDOMINAL FLUSH AORTOGRAM  SELECTIVE SMA ANGIOGRAM  SELECTIVE PERIPHERAL SMA RIGHT COLIC BRANCH MICRO CATHETERIZATION AND COIL EMBOLIZATION OF ACUTE ARTERIAL BLEED  Date:  3/12/20153/01/2014 12:19 AM  Radiologist:  Jerilynn Mages. Daryll Brod, MD  Guidance:  Ultrasound and fluoroscopic  FLUOROSCOPY TIME:  26 min 6 seconds  MEDICATIONS AND MEDICAL HISTORY: 2.5 mg Versed, 75 mcg fentanyl  ANESTHESIA/SEDATION: 110 min  CONTRAST:  176m OMNIPAQUE IOHEXOL 300 MG/ML  SOLN  COMPLICATIONS: No immediate  PROCEDURE: Informed consent was obtained from the patient following explanation of the procedure, risks, benefits and alternatives. The patient understands, agrees and consents for the procedure. All questions were addressed. A time out was performed.  Maximal barrier sterile technique utilized including caps, mask, sterile gowns, sterile gloves, large sterile drape, hand hygiene, and Betadine.  Initially, under sterile conditions and local anesthesia, ultrasound right common femoral artery micropuncture access was performed. The micro guidewire would not advance centrally after 3 attempts. This was secondary to shelf-like plaque formation upon contrast injection. Access removed. Hemostasis obtained with manual compression. Attention directed to the left common femoral artery site. Under sterile conditions and local anesthesia, ultrasound micropuncture access performed of the left common femoral artery. Guidewire advanced easily. Five French sheath inserted over a Bentson guidewire. The pigtail catheter was inserted into the abdominal aorta. Flush aortogram performed.  Aortogram: Diffuse  calcific atherosclerosis. Negative for aneurysm. Celiac, SMA, renal arteries and IMA all appear patent. On the delayed images, there is contrast extravasation in the right abdomen.  Pigtail catheter exchanged for a C2 catheter utilized to select the SMA origin. Selective SMA angiogram performed. There is active contrast extravasation compatible with arterial bleeding from a peripheral right colic branch at the level of the hepatic flexure.  A Renegade micro catheter and micro Glidewire were utilized to select peripheral right colic branches. Selective right colic angiograms performed throughout the hepatic flexure region of the colon. A peripheral right colic branch was eventually catheterize close to the area of active bleeding on previous angiograms. This area was interrogated extensively. 70 mcg nitroglycerin was also given to provoke bleeding in the region. Despite this, no definite active bleeding was demonstrated. However when correlating with the prior study this was close to the original extravasation site. To decrease arterial pressure in the marginal branches, one 3 mm x 6 mm interlock coil was deployed within a peripheral right colic marginal branch.  Micro catheter was retracted. A second right colic branch was catheterized directed to the hepatic flexure. Angiogram of this area demonstrated collateral communication through marginal branches. From a peripheral right colic branch, active bleeding was still present.  The micro catheter was retracted and advanced into the active bleeding right colic branch over a Glidewire. Access was  advanced peripheral within the right colic suspicious branch. Selective angiogram within this third right colic branch confirmed active bleeding within the hepatic flexure of the colon with contrast opacifying the colonic lumen.  From this peripheral location, one 2 mm x 3 mm Interlock coil and two 2 mm x 4 cm interlock coils were deployed in the peripheral right colic branch.  Following this, several repeat post embolization angiograms were repeated over approximately 10 min. No further active bleeding was demonstrated.  Catheter removed. Hemostasis obtained with a 6 [place] Starclose device. No immediate complication. Patient tolerated the procedure well.  IMPRESSION: Initial SMA angiogram confirms contrast extravasation from a peripheral right colic branch into the hepatic flexure of the colon compatible with arterial bleeding. This correlates with the nuclear medicine scan.  Peripheral micro catheter access was successful at accessing the peripheral SMA right colic branch to perform micro coil embolization. Post embolization, no further active bleeding could be demonstrated.   Electronically Signed   By: Daryll Brod M.D.   On: 04/29/2013 15:52   Dg Chest Port 1 View  04/27/2013   CLINICAL DATA A central line.  GI bleed both wall  EXAM PORTABLE CHEST - 1 VIEW  COMPARISON 10/13/2009  FINDINGS Examination is limited by rightward rotation. This is the best obtainable exam due to patient condition.  Right IJ catheter, tip at the mid to upper SVC. No pneumothorax or hemothorax. Cardiomegaly, comparison with prior limited due to above. Interstitial coarsening with interstitial crowding from hypoaeration.  IMPRESSION Right IJ catheter without adverse features.  SIGNATURE  Electronically Signed   By: Jorje Guild M.D.   On: 04/27/2013 15:56   [place] Guide Roadmapping  04/29/2013   CLINICAL DATA:  Lower GI acute bleeding requiring blood transfusions, hypotensive, tachycardic, upper endoscopy negative, nuclear medicine bleeding scan positive for right colonic bleeding  EXAM: ULTRASOUND GUIDANCE VASCULAR ACCESS  ABDOMINAL FLUSH AORTOGRAM  SELECTIVE SMA ANGIOGRAM  SELECTIVE PERIPHERAL SMA RIGHT COLIC BRANCH MICRO CATHETERIZATION AND COIL EMBOLIZATION OF ACUTE ARTERIAL BLEED  Date:  3/12/20153/01/2014 12:19 AM  Radiologist:  Jerilynn Mages. Daryll Brod, MD  Guidance:   Ultrasound and fluoroscopic  FLUOROSCOPY TIME:  26 min 6 seconds  MEDICATIONS AND MEDICAL HISTORY: 2.5 mg Versed, 75 mcg fentanyl  ANESTHESIA/SEDATION: 110 min  CONTRAST:  155m OMNIPAQUE IOHEXOL 300 MG/ML  SOLN  COMPLICATIONS: No immediate  PROCEDURE: Informed consent was obtained from the patient following explanation of the procedure, risks, benefits and alternatives. The patient understands, agrees and consents for the procedure. All questions were addressed. A time out was performed.  Maximal barrier sterile technique utilized including caps, mask, sterile gowns, sterile gloves, large sterile drape, hand hygiene, and Betadine.  Initially, under sterile conditions and local anesthesia, ultrasound right common femoral artery micropuncture access was performed. The micro guidewire would not advance centrally after 3 attempts. This was secondary to shelf-like plaque formation upon contrast injection. Access removed. Hemostasis obtained with manual compression. Attention directed to the left common femoral artery site. Under sterile conditions and local anesthesia, ultrasound micropuncture access performed of the left common femoral artery. Guidewire advanced easily. Five French sheath inserted over a Bentson guidewire. The pigtail catheter was inserted into the abdominal aorta. Flush aortogram performed.  Aortogram: Diffuse calcific atherosclerosis. Negative for aneurysm. Celiac, SMA, renal arteries and IMA all appear patent. On the delayed images, there is contrast extravasation in the right abdomen.  Pigtail catheter exchanged for a C2 catheter utilized to select the  SMA origin. Selective SMA angiogram performed. There is active contrast extravasation compatible with arterial bleeding from a peripheral right colic branch at the level of the hepatic flexure.  A Renegade micro catheter and micro Glidewire were utilized to select peripheral right colic branches. Selective right colic angiograms performed throughout  the hepatic flexure region of the colon. A peripheral right colic branch was eventually catheterize close to the area of active bleeding on previous angiograms. This area was interrogated extensively. 70 mcg nitroglycerin was also given to provoke bleeding in the region. Despite this, no definite active bleeding was demonstrated. However when correlating with the prior study this was close to the original extravasation site. To decrease arterial pressure in the marginal branches, one 3 mm x 6 mm interlock coil was deployed within a peripheral right colic marginal branch.  Micro catheter was retracted. A second right colic branch was catheterized directed to the hepatic flexure. Angiogram of this area demonstrated collateral communication through marginal branches. From a peripheral right colic branch, active bleeding was still present.  The micro catheter was retracted and advanced into the active bleeding right colic branch over a Glidewire. Access was advanced peripheral within the right colic suspicious branch. Selective angiogram within this third right colic branch confirmed active bleeding within the hepatic flexure of the colon with contrast opacifying the colonic lumen.  From this peripheral location, one 2 mm x 3 mm Interlock coil and two 2 mm x 4 cm interlock coils were deployed in the peripheral right colic branch. Following this, several repeat post embolization angiograms were repeated over approximately 10 min. No further active bleeding was demonstrated.  Catheter removed. Hemostasis obtained with a 6 [place] Starclose device. No immediate complication. Patient tolerated the procedure well.  IMPRESSION: Initial SMA angiogram confirms contrast extravasation from a peripheral right colic branch into the hepatic flexure of the colon compatible with arterial bleeding. This correlates with the nuclear medicine scan.  Peripheral micro catheter access was successful at accessing the peripheral SMA right colic  branch to perform micro coil embolization. Post embolization, no further active bleeding could be demonstrated.   Electronically Signed   By: Daryll Brod M.D.   On: 04/29/2013 15:52   Ct Maxillofacial Wo Cm  04/25/2013   CLINICAL DATA:  Fall.  Bruising to left orbit.  EXAM: CT HEAD WITHOUT CONTRAST  CT MAXILLOFACIAL WITHOUT CONTRAST  TECHNIQUE: Multidetector CT imaging of the head and maxillofacial structures were performed using the standard protocol without intravenous contrast. Multiplanar CT image reconstructions of the maxillofacial structures were also generated.  COMPARISON:  None.  FINDINGS: CT HEAD FINDINGS  Diffuse, extensive cerebral atrophy. Dilated extra-axial CSF spaces over the cerebral hemispheres bilaterally most likely related to the volume loss, or this could be related to chronic subdural hygromas. No hemorrhage. No mass lesion or acute infarction. No hydrocephalus.  No acute calvarial abnormality. Left orbital fractures noted. See maxillofacial report below.  CT MAXILLOFACIAL FINDINGS  Fracture through the left lateral orbital wall and orbital floor. Probable nondisplaced zygomatic arch fracture on the left. Fracture also noted posteriorly within the lateral posterior orbital wall on image 68 of series 6. This is also noted on image 71 of series 6. The fracture line continues laterally into the sphenoid wing and superiorly into the squamosal portion of the left temporal bone.  Small amount of fluid noted in the left maxillary sinus. Remainder the paranasal sinuses are clear. Mastoids are clear. Globes are intact.  IMPRESSION: Diffuse cerebral atrophy. Prominent extra-axial low-density  fluid likely related to the atrophy although these could represent chronic subdural hygromas.  No acute intracranial abnormality.  Complex left orbital fracture including anterior and posterior lateral walls, extending laterally into the sphenoid wing, and likely into the squamosal portion of the temporal bone.  Left orbital floor fracture. Nondisplaced zygomatic arch fracture.   Electronically Signed   By: Rolm Baptise M.D.   On: 04/25/2013 17:32   ASSESSMENT:  1. Acute GI bleed - s/p diverticular bleed with coil embolization. Hbg still low at 7.7 2. Hypovolemic shock on Levophed for pressure control  3. Dyslipidemia  4. Prediabetes  5. Syncope with orbital fracture  6. Anemia secondary to #1  7. NSTEMI most likely type II secondary to demand ischemia from GI bleed and profound anemia +/- underlying CAD. 2D echo with normal LVF.  Troponin peak fairly high but trending downward.  I suspect he has underlying CAD given how high troponin peaked 8.  Hypokalemia 9. Acute diastolic CHF - lungs are clear with no LE edema.  BP is borderline low and just got of pressors.  His O2 sats are fine so will hold off on diuretics at this time.  PLAN:  1. Severe anemia with ongoing significant anemia and hypovolemic shock prohibits traditional treatment for MI (ASA/Heparin/beta blocker)  2. Transfuse to keep Hbg >9  3. Further cardiac workup on hold until GI bleed resolved        Sueanne Margarita, MD  04/30/2013  9:26 AM

## 2013-04-30 NOTE — Clinical Social Work Placement (Addendum)
Clinical Social Work Department CLINICAL SOCIAL WORK PLACEMENT NOTE 04/30/2013  Patient:  Philip Simpson, Philip Simpson  Account Number:  [id number] Admit date:  04/25/2013  Clinical Social Worker:  Lovey Newcomer  Date/time:  04/30/2013 08:51 AM  Clinical Social Work is seeking post-discharge placement for this patient at the following level of care:   SKILLED NURSING   (*CSW will update this form in Epic as items are completed)   04/28/2013  Patient/family provided with [facility] Department of Clinical Social Work's list of facilities offering this level of care within the geographic area requested by the patient (or if unable, by the patient's family).  04/28/2013  Patient/family informed of their freedom to choose among providers that offer the needed level of care, that participate in Medicare, Medicaid or managed care program needed by the patient, have an available bed and are willing to accept the patient.  04/28/2013  Patient/family informed of [facility]' ownership interest in [facility], as well as of the fact that they are under no obligation to receive care at this facility.  PASARR submitted to EDS on 04/29/2013 PASARR number received from EDS on 04/29/2013  FL2 transmitted to all facilities in geographic area requested by pt/family on  04/29/2013 FL2 transmitted to all facilities within larger geographic area on   Patient informed that his/her managed care company has contracts with or will negotiate with  certain facilities, including the following:     Patient/family informed of bed offers received:  05/01/13 Patient chooses bed at [facility] Physician recommends and patient chooses bed at    Patient to be transferred to [facility] on  05/17/2013 Patient to be transferred to facility by [facility]  The following physician request were entered in Epic:   Additional Comments: I called pt's son and informed him I received a call from [place] [facility]  and pt is discharging to facility today. Providing RN with discharge packet, and PTAR requested online for 4:30pm/next available.    Liz Beach, [place], [place], [phone]   Ky Barban, MSW, [facility] Clinical Social Worker [phone]

## 2013-04-30 NOTE — Progress Notes (Signed)
2 Days Post-Op  Subjective: Pt denies further GI bleeding since embolization procedure 3/11; does have LBP  Objective: Vital signs in last 24 hours: Temp:  [98 F (36.7 C)-99 F (37.2 C)] 98.9 F (37.2 C) (03/13 0400) Pulse Rate:  [33-118] 74 (03/13 1123) Resp:  [18-26] 18 (03/13 0800) BP: (71-126)/(13-94) 88/38 mmHg (03/13 1123) SpO2:  [82 %-100 %] 100 % (03/13 1123) Last BM Date: 04/29/13  Intake/Output from previous day: 03/12 0701 - 03/13 0700 In: 2362.4 [I.V.:1956.4; IV Piggyback:406] Out: 351 [Urine:351] Intake/Output this shift: Total I/O In: 60.7 [I.V.:60.7] Out: 120 [Urine:120]  bilat CFA access sites clean and dry, mildly tender, no hematomas; abd soft,NT  Lab Results:   Recent Labs  04/29/13 2334 04/30/13 0500  WBC 10.3 10.4  HGB 7.4* 7.7*  HCT 20.3* 21.3*  PLT 87* 89*   BMET  Recent Labs  04/29/13 2221 04/30/13 0500  NA 139 140  K 3.6* 3.5*  CL 108 107  CO2 20 22  GLUCOSE 182* 179*  BUN 28* 24*  CREATININE 0.91 0.85  CALCIUM 7.1* 7.2*   PT/INR  Recent Labs  04/27/13 1600  LABPROT 15.3*  INR 1.24   ABG  Recent Labs  04/28/13 0357  PHART 7.396  HCO3 15.6*    Studies/Results: Nm Gi Blood Loss  04/28/2013   CLINICAL DATA Hematochezia  EXAM NUCLEAR MEDICINE GASTROINTESTINAL BLEEDING SCAN  TECHNIQUE Sequential abdominal images were obtained following intravenous administration of Tc-82mlabeled red blood cells.  RADIOPHARMACEUTICALS 25 MILLI CURIE ULTRATAG TECHNETIUM TC 30M-LABELED RED BLOOD CELLS IV KIT  COMPARISON None  FINDINGS Abnormal red cell localization is identified in the right upper quadrant beginning at 31 min.  This abnormal tracer continues through multiple loops of bowel in the mid abdomen and right upper quadrant.  This probably represents a duodenal or proximal jejunal bleeding source.  No tracer is seen along the expected course of the colon.  IMPRESSION Abnormal GI bleeding exam demonstrating what appears to be small  bowel localization of tracer in the upper mid abdomen in right upper quadrant, question duodenal versus proximal jejunal origin.  No definite colonic localization of labeled erythrocytes is identified.  SIGNATURE  Electronically Signed   By: MLavonia DanaM.D.   On: 04/28/2013 13:14   Ir Angiogram Visceral Selective  04/29/2013   CLINICAL DATA:  Lower GI acute bleeding requiring blood transfusions, hypotensive, tachycardic, upper endoscopy negative, nuclear medicine bleeding scan positive for right colonic bleeding  EXAM: ULTRASOUND GUIDANCE VASCULAR ACCESS  ABDOMINAL FLUSH AORTOGRAM  SELECTIVE SMA ANGIOGRAM  SELECTIVE PERIPHERAL SMA RIGHT COLIC BRANCH MICRO CATHETERIZATION AND COIL EMBOLIZATION OF ACUTE ARTERIAL BLEED  Date:  3/12/20153/01/2014 12:19 AM  Radiologist:  MJerilynn Mages TDaryll Brod MD  Guidance:  Ultrasound and fluoroscopic  FLUOROSCOPY TIME:  26 min 6 seconds  MEDICATIONS AND MEDICAL HISTORY: 2.5 mg Versed, 75 mcg fentanyl  ANESTHESIA/SEDATION: 110 min  CONTRAST:  1144mOMNIPAQUE IOHEXOL 300 MG/ML  SOLN  COMPLICATIONS: No immediate  PROCEDURE: Informed consent was obtained from the patient following explanation of the procedure, risks, benefits and alternatives. The patient understands, agrees and consents for the procedure. All questions were addressed. A time out was performed.  Maximal barrier sterile technique utilized including caps, mask, sterile gowns, sterile gloves, large sterile drape, hand hygiene, and Betadine.  Initially, under sterile conditions and local anesthesia, ultrasound right common femoral artery micropuncture access was performed. The micro guidewire would not advance centrally after 3 attempts. This was secondary to shelf-like plaque formation upon contrast  injection. Access removed. Hemostasis obtained with manual compression. Attention directed to the left common femoral artery site. Under sterile conditions and local anesthesia, ultrasound micropuncture access performed of the left  common femoral artery. Guidewire advanced easily. Five French sheath inserted over a Bentson guidewire. The pigtail catheter was inserted into the abdominal aorta. Flush aortogram performed.  Aortogram: Diffuse calcific atherosclerosis. Negative for aneurysm. Celiac, SMA, renal arteries and IMA all appear patent. On the delayed images, there is contrast extravasation in the right abdomen.  Pigtail catheter exchanged for a C2 catheter utilized to select the SMA origin. Selective SMA angiogram performed. There is active contrast extravasation compatible with arterial bleeding from a peripheral right colic branch at the level of the hepatic flexure.  A Renegade micro catheter and micro Glidewire were utilized to select peripheral right colic branches. Selective right colic angiograms performed throughout the hepatic flexure region of the colon. A peripheral right colic branch was eventually catheterize close to the area of active bleeding on previous angiograms. This area was interrogated extensively. 70 mcg nitroglycerin was also given to provoke bleeding in the region. Despite this, no definite active bleeding was demonstrated. However when correlating with the prior study this was close to the original extravasation site. To decrease arterial pressure in the marginal branches, one 3 mm x 6 mm interlock coil was deployed within a peripheral right colic marginal branch.  Micro catheter was retracted. A second right colic branch was catheterized directed to the hepatic flexure. Angiogram of this area demonstrated collateral communication through marginal branches. From a peripheral right colic branch, active bleeding was still present.  The micro catheter was retracted and advanced into the active bleeding right colic branch over a Glidewire. Access was advanced peripheral within the right colic suspicious branch. Selective angiogram within this third right colic branch confirmed active bleeding within the hepatic  flexure of the colon with contrast opacifying the colonic lumen.  From this peripheral location, one 2 mm x 3 mm Interlock coil and two 2 mm x 4 cm interlock coils were deployed in the peripheral right colic branch. Following this, several repeat post embolization angiograms were repeated over approximately 10 min. No further active bleeding was demonstrated.  Catheter removed. Hemostasis obtained with a 6 [place] Starclose device. No immediate complication. Patient tolerated the procedure well.  IMPRESSION: Initial SMA angiogram confirms contrast extravasation from a peripheral right colic branch into the hepatic flexure of the colon compatible with arterial bleeding. This correlates with the nuclear medicine scan.  Peripheral micro catheter access was successful at accessing the peripheral SMA right colic branch to perform micro coil embolization. Post embolization, no further active bleeding could be demonstrated.   Electronically Signed   By: Daryll Brod M.D.   On: 04/29/2013 15:52   Ir Angiogram Selective Each Additional Vessel  04/29/2013   CLINICAL DATA:  Lower GI acute bleeding requiring blood transfusions, hypotensive, tachycardic, upper endoscopy negative, nuclear medicine bleeding scan positive for right colonic bleeding  EXAM: ULTRASOUND GUIDANCE VASCULAR ACCESS  ABDOMINAL FLUSH AORTOGRAM  SELECTIVE SMA ANGIOGRAM  SELECTIVE PERIPHERAL SMA RIGHT COLIC BRANCH MICRO CATHETERIZATION AND COIL EMBOLIZATION OF ACUTE ARTERIAL BLEED  Date:  3/12/20153/01/2014 12:19 AM  Radiologist:  Jerilynn Mages. Daryll Brod, MD  Guidance:  Ultrasound and fluoroscopic  FLUOROSCOPY TIME:  26 min 6 seconds  MEDICATIONS AND MEDICAL HISTORY: 2.5 mg Versed, 75 mcg fentanyl  ANESTHESIA/SEDATION: 110 min  CONTRAST:  140m OMNIPAQUE IOHEXOL 300 MG/ML  SOLN  COMPLICATIONS: No immediate  PROCEDURE: Informed consent  was obtained from the patient following explanation of the procedure, risks, benefits and alternatives. The patient understands,  agrees and consents for the procedure. All questions were addressed. A time out was performed.  Maximal barrier sterile technique utilized including caps, mask, sterile gowns, sterile gloves, large sterile drape, hand hygiene, and Betadine.  Initially, under sterile conditions and local anesthesia, ultrasound right common femoral artery micropuncture access was performed. The micro guidewire would not advance centrally after 3 attempts. This was secondary to shelf-like plaque formation upon contrast injection. Access removed. Hemostasis obtained with manual compression. Attention directed to the left common femoral artery site. Under sterile conditions and local anesthesia, ultrasound micropuncture access performed of the left common femoral artery. Guidewire advanced easily. Five French sheath inserted over a Bentson guidewire. The pigtail catheter was inserted into the abdominal aorta. Flush aortogram performed.  Aortogram: Diffuse calcific atherosclerosis. Negative for aneurysm. Celiac, SMA, renal arteries and IMA all appear patent. On the delayed images, there is contrast extravasation in the right abdomen.  Pigtail catheter exchanged for a C2 catheter utilized to select the SMA origin. Selective SMA angiogram performed. There is active contrast extravasation compatible with arterial bleeding from a peripheral right colic branch at the level of the hepatic flexure.  A Renegade micro catheter and micro Glidewire were utilized to select peripheral right colic branches. Selective right colic angiograms performed throughout the hepatic flexure region of the colon. A peripheral right colic branch was eventually catheterize close to the area of active bleeding on previous angiograms. This area was interrogated extensively. 70 mcg nitroglycerin was also given to provoke bleeding in the region. Despite this, no definite active bleeding was demonstrated. However when correlating with the prior study this was close to the  original extravasation site. To decrease arterial pressure in the marginal branches, one 3 mm x 6 mm interlock coil was deployed within a peripheral right colic marginal branch.  Micro catheter was retracted. A second right colic branch was catheterized directed to the hepatic flexure. Angiogram of this area demonstrated collateral communication through marginal branches. From a peripheral right colic branch, active bleeding was still present.  The micro catheter was retracted and advanced into the active bleeding right colic branch over a Glidewire. Access was advanced peripheral within the right colic suspicious branch. Selective angiogram within this third right colic branch confirmed active bleeding within the hepatic flexure of the colon with contrast opacifying the colonic lumen.  From this peripheral location, one 2 mm x 3 mm Interlock coil and two 2 mm x 4 cm interlock coils were deployed in the peripheral right colic branch. Following this, several repeat post embolization angiograms were repeated over approximately 10 min. No further active bleeding was demonstrated.  Catheter removed. Hemostasis obtained with a 6 [place] Starclose device. No immediate complication. Patient tolerated the procedure well.  IMPRESSION: Initial SMA angiogram confirms contrast extravasation from a peripheral right colic branch into the hepatic flexure of the colon compatible with arterial bleeding. This correlates with the nuclear medicine scan.  Peripheral micro catheter access was successful at accessing the peripheral SMA right colic branch to perform micro coil embolization. Post embolization, no further active bleeding could be demonstrated.   Electronically Signed   By: Daryll Brod M.D.   On: 04/29/2013 15:52   Ir Angiogram Follow Up Study  04/29/2013   CLINICAL DATA:  Lower GI acute bleeding requiring blood transfusions, hypotensive, tachycardic, upper endoscopy negative, nuclear medicine bleeding scan positive for  right colonic bleeding  EXAM:  ULTRASOUND GUIDANCE VASCULAR ACCESS  ABDOMINAL FLUSH AORTOGRAM  SELECTIVE SMA ANGIOGRAM  SELECTIVE PERIPHERAL SMA RIGHT COLIC BRANCH MICRO CATHETERIZATION AND COIL EMBOLIZATION OF ACUTE ARTERIAL BLEED  Date:  3/12/20153/01/2014 12:19 AM  Radiologist:  Jerilynn Mages. Daryll Brod, MD  Guidance:  Ultrasound and fluoroscopic  FLUOROSCOPY TIME:  26 min 6 seconds  MEDICATIONS AND MEDICAL HISTORY: 2.5 mg Versed, 75 mcg fentanyl  ANESTHESIA/SEDATION: 110 min  CONTRAST:  167m OMNIPAQUE IOHEXOL 300 MG/ML  SOLN  COMPLICATIONS: No immediate  PROCEDURE: Informed consent was obtained from the patient following explanation of the procedure, risks, benefits and alternatives. The patient understands, agrees and consents for the procedure. All questions were addressed. A time out was performed.  Maximal barrier sterile technique utilized including caps, mask, sterile gowns, sterile gloves, large sterile drape, hand hygiene, and Betadine.  Initially, under sterile conditions and local anesthesia, ultrasound right common femoral artery micropuncture access was performed. The micro guidewire would not advance centrally after 3 attempts. This was secondary to shelf-like plaque formation upon contrast injection. Access removed. Hemostasis obtained with manual compression. Attention directed to the left common femoral artery site. Under sterile conditions and local anesthesia, ultrasound micropuncture access performed of the left common femoral artery. Guidewire advanced easily. Five French sheath inserted over a Bentson guidewire. The pigtail catheter was inserted into the abdominal aorta. Flush aortogram performed.  Aortogram: Diffuse calcific atherosclerosis. Negative for aneurysm. Celiac, SMA, renal arteries and IMA all appear patent. On the delayed images, there is contrast extravasation in the right abdomen.  Pigtail catheter exchanged for a C2 catheter utilized to select the SMA origin. Selective SMA angiogram  performed. There is active contrast extravasation compatible with arterial bleeding from a peripheral right colic branch at the level of the hepatic flexure.  A Renegade micro catheter and micro Glidewire were utilized to select peripheral right colic branches. Selective right colic angiograms performed throughout the hepatic flexure region of the colon. A peripheral right colic branch was eventually catheterize close to the area of active bleeding on previous angiograms. This area was interrogated extensively. 70 mcg nitroglycerin was also given to provoke bleeding in the region. Despite this, no definite active bleeding was demonstrated. However when correlating with the prior study this was close to the original extravasation site. To decrease arterial pressure in the marginal branches, one 3 mm x 6 mm interlock coil was deployed within a peripheral right colic marginal branch.  Micro catheter was retracted. A second right colic branch was catheterized directed to the hepatic flexure. Angiogram of this area demonstrated collateral communication through marginal branches. From a peripheral right colic branch, active bleeding was still present.  The micro catheter was retracted and advanced into the active bleeding right colic branch over a Glidewire. Access was advanced peripheral within the right colic suspicious branch. Selective angiogram within this third right colic branch confirmed active bleeding within the hepatic flexure of the colon with contrast opacifying the colonic lumen.  From this peripheral location, one 2 mm x 3 mm Interlock coil and two 2 mm x 4 cm interlock coils were deployed in the peripheral right colic branch. Following this, several repeat post embolization angiograms were repeated over approximately 10 min. No further active bleeding was demonstrated.  Catheter removed. Hemostasis obtained with a 6 [place] device. No immediate complication. Patient tolerated the procedure well.   IMPRESSION: Initial SMA angiogram confirms contrast extravasation from a peripheral right colic branch into the hepatic flexure of the colon compatible with arterial bleeding. This correlates  with the nuclear medicine scan.  Peripheral micro catheter access was successful at accessing the peripheral SMA right colic branch to perform micro coil embolization. Post embolization, no further active bleeding could be demonstrated.   Electronically Signed   By: Daryll Brod M.D.   On: 04/29/2013 15:52   Ir US Guide Vasc Access Left  04/29/2013   CLINICAL DATA:  Lower GI acute bleeding requiring blood transfusions, hypotensive, tachycardic, upper endoscopy negative, nuclear medicine bleeding scan positive for right colonic bleeding  EXAM: ULTRASOUND GUIDANCE VASCULAR ACCESS  ABDOMINAL FLUSH AORTOGRAM  SELECTIVE SMA ANGIOGRAM  SELECTIVE PERIPHERAL SMA RIGHT COLIC BRANCH MICRO CATHETERIZATION AND COIL EMBOLIZATION OF ACUTE ARTERIAL BLEED  Date:  3/12/20153/01/2014 12:19 AM  Radiologist:  Jerilynn Mages. Daryll Brod, MD  Guidance:  Ultrasound and fluoroscopic  FLUOROSCOPY TIME:  26 min 6 seconds  MEDICATIONS AND MEDICAL HISTORY: 2.5 mg Versed, 75 mcg fentanyl  ANESTHESIA/SEDATION: 110 min  CONTRAST:  15m OMNIPAQUE IOHEXOL 300 MG/ML  SOLN  COMPLICATIONS: No immediate  PROCEDURE: Informed consent was obtained from the patient following explanation of the procedure, risks, benefits and alternatives. The patient understands, agrees and consents for the procedure. All questions were addressed. A time out was performed.  Maximal barrier sterile technique utilized including caps, mask, sterile gowns, sterile gloves, large sterile drape, hand hygiene, and Betadine.  Initially, under sterile conditions and local anesthesia, ultrasound right common femoral artery micropuncture access was performed. The micro guidewire would not advance centrally after 3 attempts. This was secondary to shelf-like plaque formation upon contrast injection.  Access removed. Hemostasis obtained with manual compression. Attention directed to the left common femoral artery site. Under sterile conditions and local anesthesia, ultrasound micropuncture access performed of the left common femoral artery. Guidewire advanced easily. Five French sheath inserted over a Bentson guidewire. The pigtail catheter was inserted into the abdominal aorta. Flush aortogram performed.  Aortogram: Diffuse calcific atherosclerosis. Negative for aneurysm. Celiac, SMA, renal arteries and IMA all appear patent. On the delayed images, there is contrast extravasation in the right abdomen.  Pigtail catheter exchanged for a C2 catheter utilized to select the SMA origin. Selective SMA angiogram performed. There is active contrast extravasation compatible with arterial bleeding from a peripheral right colic branch at the level of the hepatic flexure.  A Renegade micro catheter and micro Glidewire were utilized to select peripheral right colic branches. Selective right colic angiograms performed throughout the hepatic flexure region of the colon. A peripheral right colic branch was eventually catheterize close to the area of active bleeding on previous angiograms. This area was interrogated extensively. 70 mcg nitroglycerin was also given to provoke bleeding in the region. Despite this, no definite active bleeding was demonstrated. However when correlating with the prior study this was close to the original extravasation site. To decrease arterial pressure in the marginal branches, one 3 mm x 6 mm interlock coil was deployed within a peripheral right colic marginal branch.  Micro catheter was retracted. A second right colic branch was catheterized directed to the hepatic flexure. Angiogram of this area demonstrated collateral communication through marginal branches. From a peripheral right colic branch, active bleeding was still present.  The micro catheter was retracted and advanced into the active  bleeding right colic branch over a Glidewire. Access was advanced peripheral within the right colic suspicious branch. Selective angiogram within this third right colic branch confirmed active bleeding within the hepatic flexure of the colon with contrast opacifying the colonic lumen.  From this peripheral location, one 2  mm x 3 mm Interlock coil and two 2 mm x 4 cm interlock coils were deployed in the peripheral right colic branch. Following this, several repeat post embolization angiograms were repeated over approximately 10 min. No further active bleeding was demonstrated.  Catheter removed. Hemostasis obtained with a 6 [place] Starclose device. No immediate complication. Patient tolerated the procedure well.  IMPRESSION: Initial SMA angiogram confirms contrast extravasation from a peripheral right colic branch into the hepatic flexure of the colon compatible with arterial bleeding. This correlates with the nuclear medicine scan.  Peripheral micro catheter access was successful at accessing the peripheral SMA right colic branch to perform micro coil embolization. Post embolization, no further active bleeding could be demonstrated.   Electronically Signed   By: Daryll Brod M.D.   On: 04/29/2013 15:52   [place] Guide Roadmapping  04/29/2013   CLINICAL DATA:  Lower GI acute bleeding requiring blood transfusions, hypotensive, tachycardic, upper endoscopy negative, nuclear medicine bleeding scan positive for right colonic bleeding  EXAM: ULTRASOUND GUIDANCE VASCULAR ACCESS  ABDOMINAL FLUSH AORTOGRAM  SELECTIVE SMA ANGIOGRAM  SELECTIVE PERIPHERAL SMA RIGHT COLIC BRANCH MICRO CATHETERIZATION AND COIL EMBOLIZATION OF ACUTE ARTERIAL BLEED  Date:  3/12/20153/01/2014 12:19 AM  Radiologist:  Jerilynn Mages. Daryll Brod, MD  Guidance:  Ultrasound and fluoroscopic  FLUOROSCOPY TIME:  26 min 6 seconds  MEDICATIONS AND MEDICAL HISTORY: 2.5 mg Versed, 75 mcg fentanyl  ANESTHESIA/SEDATION: 110 min   CONTRAST:  142m OMNIPAQUE IOHEXOL 300 MG/ML  SOLN  COMPLICATIONS: No immediate  PROCEDURE: Informed consent was obtained from the patient following explanation of the procedure, risks, benefits and alternatives. The patient understands, agrees and consents for the procedure. All questions were addressed. A time out was performed.  Maximal barrier sterile technique utilized including caps, mask, sterile gowns, sterile gloves, large sterile drape, hand hygiene, and Betadine.  Initially, under sterile conditions and local anesthesia, ultrasound right common femoral artery micropuncture access was performed. The micro guidewire would not advance centrally after 3 attempts. This was secondary to shelf-like plaque formation upon contrast injection. Access removed. Hemostasis obtained with manual compression. Attention directed to the left common femoral artery site. Under sterile conditions and local anesthesia, ultrasound micropuncture access performed of the left common femoral artery. Guidewire advanced easily. Five French sheath inserted over a Bentson guidewire. The pigtail catheter was inserted into the abdominal aorta. Flush aortogram performed.  Aortogram: Diffuse calcific atherosclerosis. Negative for aneurysm. Celiac, SMA, renal arteries and IMA all appear patent. On the delayed images, there is contrast extravasation in the right abdomen.  Pigtail catheter exchanged for a C2 catheter utilized to select the SMA origin. Selective SMA angiogram performed. There is active contrast extravasation compatible with arterial bleeding from a peripheral right colic branch at the level of the hepatic flexure.  A Renegade micro catheter and micro Glidewire were utilized to select peripheral right colic branches. Selective right colic angiograms performed throughout the hepatic flexure region of the colon. A peripheral right colic branch was eventually catheterize close to the area of active bleeding on previous angiograms.  This area was interrogated extensively. 70 mcg nitroglycerin was also given to provoke bleeding in the region. Despite this, no definite active bleeding was demonstrated. However when correlating with the prior study this was close to the original extravasation site. To decrease arterial pressure in the marginal branches, one 3 mm x 6 mm interlock coil was deployed within a peripheral right colic marginal branch.  Micro catheter  was retracted. A second right colic branch was catheterized directed to the hepatic flexure. Angiogram of this area demonstrated collateral communication through marginal branches. From a peripheral right colic branch, active bleeding was still present.  The micro catheter was retracted and advanced into the active bleeding right colic branch over a Glidewire. Access was advanced peripheral within the right colic suspicious branch. Selective angiogram within this third right colic branch confirmed active bleeding within the hepatic flexure of the colon with contrast opacifying the colonic lumen.  From this peripheral location, one 2 mm x 3 mm Interlock coil and two 2 mm x 4 cm interlock coils were deployed in the peripheral right colic branch. Following this, several repeat post embolization angiograms were repeated over approximately 10 min. No further active bleeding was demonstrated.  Catheter removed. Hemostasis obtained with a 6 [place] Starclose device. No immediate complication. Patient tolerated the procedure well.  IMPRESSION: Initial SMA angiogram confirms contrast extravasation from a peripheral right colic branch into the hepatic flexure of the colon compatible with arterial bleeding. This correlates with the nuclear medicine scan.  Peripheral micro catheter access was successful at accessing the peripheral SMA right colic branch to perform micro coil embolization. Post embolization, no further active bleeding could be demonstrated.   Electronically Signed   By: Daryll Brod M.D.    On: 04/29/2013 15:52    Anti-infectives: Anti-infectives   None      Assessment/Plan: s/p mesenteric angio with peripheral SMA rt colic branch embo  3/24 secondary to GI bleed; currently stable; monitor labs   LOS: 5 days    ALLRED,D [facility] 04/30/2013

## 2013-04-30 NOTE — Progress Notes (Signed)
PULMONARY / CRITICAL CARE MEDICINE  Name: Philip Simpson MRN: [id number] DOB: [birth year]    ADMISSION DATE:  04/25/2013 CONSULTATION DATE:  04/27/2013  REFERRING MD :  [facility] PRIMARY SERVICE:  PCCM  CHIEF COMPLAINT:  Hypotension  BRIEF PATIENT DESCRIPTION: 80 yo with history of bleeding hemorrhoids admitted 3/8 after syncopal episode and fall resulting in periorbital fracture.  EKG revealed RBBB and ST-T changes.  Troponin was elevated.  Course was complicated by brisk lower GI hemorrhage.  Endoscopy was planned, but aborted due to hypotension and the patient was brought to ICU for further management.  SIGNIFICANT EVENTS / STUDIES:  3/8    CT head >>> nad, chronic hygromas 3/8    CT maxillofacial >>> L orbital fracture 3/9    TTE >>> EF 60, grade 1 DD 3/10  Hypotensive, endoscopy aborted, brought to ICU 3/10  EGD >>> Small hiatal hernia, no active hemorrhage 3/11  NM Bleeding Scan >>> Active hemorrhage small bowel vs colon 3/11  EGD >>> No source of hemorrhage 3/11  IR >>> Hepatic flexure hemorrhage, successful embolization of R colic branch of SMA  LINES / TUBES: R IJ Cordis 3/10 >>>  CULTURES:  ANTIBIOTICS:  INTERVAL HISTORY:  Complains of hip / back pain.  VITAL SIGNS: Temp:  [98 F (36.7 C)-99 F (37.2 C)] 98.9 F (37.2 C) (03/13 0400) Pulse Rate:  [58-118] 118 (03/13 0600) Resp:  [22-26] 26 (03/13 0400) BP: (71-126)/(13-110) 116/46 mmHg (03/13 0600) SpO2:  [89 %-100 %] 89 % (03/13 0600)  HEMODYNAMICS:   VENTILATOR SETTINGS:   INTAKE / OUTPUT: Intake/Output     03/12 0701 - 03/13 0700 03/13 0701 - 03/14 0700   I.V. (mL/kg) 1946.4 (24.5)    Blood     IV Piggyback 406    Total Intake(mL/kg) 2352.4 (29.7)    Urine (mL/kg/hr) 351 (0.2)    Total Output 351     Net +2001.4          Urine Occurrence 8 x     PHYSICAL EXAMINATION: General:  Comfortable Neuro:  Sleepy but arouses to voice HEENT:  PERRL, facial ecchymoses Cardiovascular:  RRR, no m/r/g Lungs:  Clear,  diminished Abdomen:  Soft, nontender, bowel sounds diminished Musculoskeletal:  Moves all extremities, no edema Skin:  Sacral decub, does not appear infected  LABS: CBC  Recent Labs Lab 04/29/13 1600 04/29/13 2334 04/30/13 0500  WBC 10.2 10.3 10.4  HGB 7.8* 7.4* 7.7*  HCT 21.4* 20.3* 21.3*  PLT 84* 87* 89*   Coag's  Recent Labs Lab 04/27/13 1600  APTT 30  INR 1.24   BMET  Recent Labs Lab 04/29/13 0456 04/29/13 1355 04/29/13 2221 04/30/13 0500  NA 142  --  139 140  K 3.4* 3.3* 3.6* 3.5*  CL 112  --  108 107  CO2 14*  --  20 22  BUN 48*  --  28* 24*  CREATININE 0.99  --  0.91 0.85  GLUCOSE 174*  --  182* 179*   Electrolytes  Recent Labs Lab 04/29/13 0456 04/29/13 2221 04/30/13 0500  CALCIUM 7.5* 7.1* 7.2*  MG 1.7  --  2.1   Sepsis Markers  Recent Labs Lab 04/27/13 1615 04/28/13 0158  LATICACIDVEN 1.7 1.4   ABG  Recent Labs Lab 04/28/13 0357  PHART 7.396  PCO2ART 25.4*  PO2ART 112.0*   Liver Enzymes  Recent Labs Lab 04/26/13 0125 04/29/13 0456 04/30/13 0500  AST 18 71* 40*  ALT 10 30 27   ALKPHOS 47 35* 33*  BILITOT 0.3 0.4 0.5  ALBUMIN 2.7* 2.3* 2.0*   Cardiac Enzymes  Recent Labs Lab 04/28/13 1046 04/29/13 0030 04/29/13 0730  TROPONINI 15.27* 19.26* 14.25*  PROBNP  --   --  4803.0*   Glucose  Recent Labs Lab 04/28/13 1343 04/28/13 1818 04/28/13 2027 04/29/13 0105 04/29/13 0346 04/29/13 0810  GLUCAP 112* 166* 155* 173* 173* 135*   IMAGING:  Nm Gi Blood Loss  04/28/2013   CLINICAL DATA Hematochezia  EXAM NUCLEAR MEDICINE GASTROINTESTINAL BLEEDING SCAN  TECHNIQUE Sequential abdominal images were obtained following intravenous administration of Tc-67mlabeled red blood cells.  RADIOPHARMACEUTICALS 25 MILLI CURIE ULTRATAG TECHNETIUM TC 27M-LABELED RED BLOOD CELLS IV KIT  COMPARISON None  FINDINGS Abnormal red cell localization is identified in the right upper quadrant beginning at 31 min.  This abnormal tracer  continues through multiple loops of bowel in the mid abdomen and right upper quadrant.  This probably represents a duodenal or proximal jejunal bleeding source.  No tracer is seen along the expected course of the colon.  IMPRESSION Abnormal GI bleeding exam demonstrating what appears to be small bowel localization of tracer in the upper mid abdomen in right upper quadrant, question duodenal versus proximal jejunal origin.  No definite colonic localization of labeled erythrocytes is identified.  SIGNATURE  Electronically Signed   By: MLavonia DanaM.D.   On: 04/28/2013 13:14   Ir Angiogram Visceral Selective  04/29/2013   CLINICAL DATA:  Lower GI acute bleeding requiring blood transfusions, hypotensive, tachycardic, upper endoscopy negative, nuclear medicine bleeding scan positive for right colonic bleeding  EXAM: ULTRASOUND GUIDANCE VASCULAR ACCESS  ABDOMINAL FLUSH AORTOGRAM  SELECTIVE SMA ANGIOGRAM  SELECTIVE PERIPHERAL SMA RIGHT COLIC BRANCH MICRO CATHETERIZATION AND COIL EMBOLIZATION OF ACUTE ARTERIAL BLEED  Date:  3/12/20153/01/2014 12:19 AM  Radiologist:  MJerilynn Mages TDaryll Brod MD  Guidance:  Ultrasound and fluoroscopic  FLUOROSCOPY TIME:  26 min 6 seconds  MEDICATIONS AND MEDICAL HISTORY: 2.5 mg Versed, 75 mcg fentanyl  ANESTHESIA/SEDATION: 110 min  CONTRAST:  1145mOMNIPAQUE IOHEXOL 300 MG/ML  SOLN  COMPLICATIONS: No immediate  PROCEDURE: Informed consent was obtained from the patient following explanation of the procedure, risks, benefits and alternatives. The patient understands, agrees and consents for the procedure. All questions were addressed. A time out was performed.  Maximal barrier sterile technique utilized including caps, mask, sterile gowns, sterile gloves, large sterile drape, hand hygiene, and Betadine.  Initially, under sterile conditions and local anesthesia, ultrasound right common femoral artery micropuncture access was performed. The micro guidewire would not advance centrally after 3 attempts.  This was secondary to shelf-like plaque formation upon contrast injection. Access removed. Hemostasis obtained with manual compression. Attention directed to the left common femoral artery site. Under sterile conditions and local anesthesia, ultrasound micropuncture access performed of the left common femoral artery. Guidewire advanced easily. Five French sheath inserted over a Bentson guidewire. The pigtail catheter was inserted into the abdominal aorta. Flush aortogram performed.  Aortogram: Diffuse calcific atherosclerosis. Negative for aneurysm. Celiac, SMA, renal arteries and IMA all appear patent. On the delayed images, there is contrast extravasation in the right abdomen.  Pigtail catheter exchanged for a C2 catheter utilized to select the SMA origin. Selective SMA angiogram performed. There is active contrast extravasation compatible with arterial bleeding from a peripheral right colic branch at the level of the hepatic flexure.  A Renegade micro catheter and micro Glidewire were utilized to select peripheral right colic branches. Selective right colic angiograms performed throughout the hepatic flexure region  of the colon. A peripheral right colic branch was eventually catheterize close to the area of active bleeding on previous angiograms. This area was interrogated extensively. 70 mcg nitroglycerin was also given to provoke bleeding in the region. Despite this, no definite active bleeding was demonstrated. However when correlating with the prior study this was close to the original extravasation site. To decrease arterial pressure in the marginal branches, one 3 mm x 6 mm interlock coil was deployed within a peripheral right colic marginal branch.  Micro catheter was retracted. A second right colic branch was catheterized directed to the hepatic flexure. Angiogram of this area demonstrated collateral communication through marginal branches. From a peripheral right colic branch, active bleeding was still  present.  The micro catheter was retracted and advanced into the active bleeding right colic branch over a Glidewire. Access was advanced peripheral within the right colic suspicious branch. Selective angiogram within this third right colic branch confirmed active bleeding within the hepatic flexure of the colon with contrast opacifying the colonic lumen.  From this peripheral location, one 2 mm x 3 mm Interlock coil and two 2 mm x 4 cm interlock coils were deployed in the peripheral right colic branch. Following this, several repeat post embolization angiograms were repeated over approximately 10 min. No further active bleeding was demonstrated.  Catheter removed. Hemostasis obtained with a 6 [place] Starclose device. No immediate complication. Patient tolerated the procedure well.  IMPRESSION: Initial SMA angiogram confirms contrast extravasation from a peripheral right colic branch into the hepatic flexure of the colon compatible with arterial bleeding. This correlates with the nuclear medicine scan.  Peripheral micro catheter access was successful at accessing the peripheral SMA right colic branch to perform micro coil embolization. Post embolization, no further active bleeding could be demonstrated.   Electronically Signed   By: Daryll Brod M.D.   On: 04/29/2013 15:52   Ir Angiogram Selective Each Additional Vessel  04/29/2013   CLINICAL DATA:  Lower GI acute bleeding requiring blood transfusions, hypotensive, tachycardic, upper endoscopy negative, nuclear medicine bleeding scan positive for right colonic bleeding  EXAM: ULTRASOUND GUIDANCE VASCULAR ACCESS  ABDOMINAL FLUSH AORTOGRAM  SELECTIVE SMA ANGIOGRAM  SELECTIVE PERIPHERAL SMA RIGHT COLIC BRANCH MICRO CATHETERIZATION AND COIL EMBOLIZATION OF ACUTE ARTERIAL BLEED  Date:  3/12/20153/01/2014 12:19 AM  Radiologist:  Jerilynn Mages. Daryll Brod, MD  Guidance:  Ultrasound and fluoroscopic  FLUOROSCOPY TIME:  26 min 6 seconds  MEDICATIONS AND MEDICAL HISTORY: 2.5 mg  Versed, 75 mcg fentanyl  ANESTHESIA/SEDATION: 110 min  CONTRAST:  17m OMNIPAQUE IOHEXOL 300 MG/ML  SOLN  COMPLICATIONS: No immediate  PROCEDURE: Informed consent was obtained from the patient following explanation of the procedure, risks, benefits and alternatives. The patient understands, agrees and consents for the procedure. All questions were addressed. A time out was performed.  Maximal barrier sterile technique utilized including caps, mask, sterile gowns, sterile gloves, large sterile drape, hand hygiene, and Betadine.  Initially, under sterile conditions and local anesthesia, ultrasound right common femoral artery micropuncture access was performed. The micro guidewire would not advance centrally after 3 attempts. This was secondary to shelf-like plaque formation upon contrast injection. Access removed. Hemostasis obtained with manual compression. Attention directed to the left common femoral artery site. Under sterile conditions and local anesthesia, ultrasound micropuncture access performed of the left common femoral artery. Guidewire advanced easily. Five French sheath inserted over a Bentson guidewire. The pigtail catheter was inserted into the abdominal aorta. Flush aortogram performed.  Aortogram: Diffuse calcific atherosclerosis. Negative for  aneurysm. Celiac, SMA, renal arteries and IMA all appear patent. On the delayed images, there is contrast extravasation in the right abdomen.  Pigtail catheter exchanged for a C2 catheter utilized to select the SMA origin. Selective SMA angiogram performed. There is active contrast extravasation compatible with arterial bleeding from a peripheral right colic branch at the level of the hepatic flexure.  A Renegade micro catheter and micro Glidewire were utilized to select peripheral right colic branches. Selective right colic angiograms performed throughout the hepatic flexure region of the colon. A peripheral right colic branch was eventually catheterize close  to the area of active bleeding on previous angiograms. This area was interrogated extensively. 70 mcg nitroglycerin was also given to provoke bleeding in the region. Despite this, no definite active bleeding was demonstrated. However when correlating with the prior study this was close to the original extravasation site. To decrease arterial pressure in the marginal branches, one 3 mm x 6 mm interlock coil was deployed within a peripheral right colic marginal branch.  Micro catheter was retracted. A second right colic branch was catheterized directed to the hepatic flexure. Angiogram of this area demonstrated collateral communication through marginal branches. From a peripheral right colic branch, active bleeding was still present.  The micro catheter was retracted and advanced into the active bleeding right colic branch over a Glidewire. Access was advanced peripheral within the right colic suspicious branch. Selective angiogram within this third right colic branch confirmed active bleeding within the hepatic flexure of the colon with contrast opacifying the colonic lumen.  From this peripheral location, one 2 mm x 3 mm Interlock coil and two 2 mm x 4 cm interlock coils were deployed in the peripheral right colic branch. Following this, several repeat post embolization angiograms were repeated over approximately 10 min. No further active bleeding was demonstrated.  Catheter removed. Hemostasis obtained with a 6 [place] Starclose device. No immediate complication. Patient tolerated the procedure well.  IMPRESSION: Initial SMA angiogram confirms contrast extravasation from a peripheral right colic branch into the hepatic flexure of the colon compatible with arterial bleeding. This correlates with the nuclear medicine scan.  Peripheral micro catheter access was successful at accessing the peripheral SMA right colic branch to perform micro coil embolization. Post embolization, no further active bleeding could be  demonstrated.   Electronically Signed   By: Daryll Brod M.D.   On: 04/29/2013 15:52   Ir Angiogram Follow Up Study  04/29/2013   CLINICAL DATA:  Lower GI acute bleeding requiring blood transfusions, hypotensive, tachycardic, upper endoscopy negative, nuclear medicine bleeding scan positive for right colonic bleeding  EXAM: ULTRASOUND GUIDANCE VASCULAR ACCESS  ABDOMINAL FLUSH AORTOGRAM  SELECTIVE SMA ANGIOGRAM  SELECTIVE PERIPHERAL SMA RIGHT COLIC BRANCH MICRO CATHETERIZATION AND COIL EMBOLIZATION OF ACUTE ARTERIAL BLEED  Date:  3/12/20153/01/2014 12:19 AM  Radiologist:  Jerilynn Mages. Daryll Brod, MD  Guidance:  Ultrasound and fluoroscopic  FLUOROSCOPY TIME:  26 min 6 seconds  MEDICATIONS AND MEDICAL HISTORY: 2.5 mg Versed, 75 mcg fentanyl  ANESTHESIA/SEDATION: 110 min  CONTRAST:  184m OMNIPAQUE IOHEXOL 300 MG/ML  SOLN  COMPLICATIONS: No immediate  PROCEDURE: Informed consent was obtained from the patient following explanation of the procedure, risks, benefits and alternatives. The patient understands, agrees and consents for the procedure. All questions were addressed. A time out was performed.  Maximal barrier sterile technique utilized including caps, mask, sterile gowns, sterile gloves, large sterile drape, hand hygiene, and Betadine.  Initially, under sterile conditions and local anesthesia, ultrasound right common femoral  artery micropuncture access was performed. The micro guidewire would not advance centrally after 3 attempts. This was secondary to shelf-like plaque formation upon contrast injection. Access removed. Hemostasis obtained with manual compression. Attention directed to the left common femoral artery site. Under sterile conditions and local anesthesia, ultrasound micropuncture access performed of the left common femoral artery. Guidewire advanced easily. Five French sheath inserted over a Bentson guidewire. The pigtail catheter was inserted into the abdominal aorta. Flush aortogram performed.   Aortogram: Diffuse calcific atherosclerosis. Negative for aneurysm. Celiac, SMA, renal arteries and IMA all appear patent. On the delayed images, there is contrast extravasation in the right abdomen.  Pigtail catheter exchanged for a C2 catheter utilized to select the SMA origin. Selective SMA angiogram performed. There is active contrast extravasation compatible with arterial bleeding from a peripheral right colic branch at the level of the hepatic flexure.  A Renegade micro catheter and micro Glidewire were utilized to select peripheral right colic branches. Selective right colic angiograms performed throughout the hepatic flexure region of the colon. A peripheral right colic branch was eventually catheterize close to the area of active bleeding on previous angiograms. This area was interrogated extensively. 70 mcg nitroglycerin was also given to provoke bleeding in the region. Despite this, no definite active bleeding was demonstrated. However when correlating with the prior study this was close to the original extravasation site. To decrease arterial pressure in the marginal branches, one 3 mm x 6 mm interlock coil was deployed within a peripheral right colic marginal branch.  Micro catheter was retracted. A second right colic branch was catheterized directed to the hepatic flexure. Angiogram of this area demonstrated collateral communication through marginal branches. From a peripheral right colic branch, active bleeding was still present.  The micro catheter was retracted and advanced into the active bleeding right colic branch over a Glidewire. Access was advanced peripheral within the right colic suspicious branch. Selective angiogram within this third right colic branch confirmed active bleeding within the hepatic flexure of the colon with contrast opacifying the colonic lumen.  From this peripheral location, one 2 mm x 3 mm Interlock coil and two 2 mm x 4 cm interlock coils were deployed in the peripheral  right colic branch. Following this, several repeat post embolization angiograms were repeated over approximately 10 min. No further active bleeding was demonstrated.  Catheter removed. Hemostasis obtained with a 6 [place] Starclose device. No immediate complication. Patient tolerated the procedure well.  IMPRESSION: Initial SMA angiogram confirms contrast extravasation from a peripheral right colic branch into the hepatic flexure of the colon compatible with arterial bleeding. This correlates with the nuclear medicine scan.  Peripheral micro catheter access was successful at accessing the peripheral SMA right colic branch to perform micro coil embolization. Post embolization, no further active bleeding could be demonstrated.   Electronically Signed   By: Daryll Brod M.D.   On: 04/29/2013 15:52   Ir US Guide Vasc Access Left  04/29/2013   CLINICAL DATA:  Lower GI acute bleeding requiring blood transfusions, hypotensive, tachycardic, upper endoscopy negative, nuclear medicine bleeding scan positive for right colonic bleeding  EXAM: ULTRASOUND GUIDANCE VASCULAR ACCESS  ABDOMINAL FLUSH AORTOGRAM  SELECTIVE SMA ANGIOGRAM  SELECTIVE PERIPHERAL SMA RIGHT COLIC BRANCH MICRO CATHETERIZATION AND COIL EMBOLIZATION OF ACUTE ARTERIAL BLEED  Date:  3/12/20153/01/2014 12:19 AM  Radiologist:  Jerilynn Mages. Daryll Brod, MD  Guidance:  Ultrasound and fluoroscopic  FLUOROSCOPY TIME:  26 min 6 seconds  MEDICATIONS AND MEDICAL HISTORY: 2.5 mg Versed, 75  mcg fentanyl  ANESTHESIA/SEDATION: 110 min  CONTRAST:  181m OMNIPAQUE IOHEXOL 300 MG/ML  SOLN  COMPLICATIONS: No immediate  PROCEDURE: Informed consent was obtained from the patient following explanation of the procedure, risks, benefits and alternatives. The patient understands, agrees and consents for the procedure. All questions were addressed. A time out was performed.  Maximal barrier sterile technique utilized including caps, mask, sterile gowns, sterile gloves, large sterile drape,  hand hygiene, and Betadine.  Initially, under sterile conditions and local anesthesia, ultrasound right common femoral artery micropuncture access was performed. The micro guidewire would not advance centrally after 3 attempts. This was secondary to shelf-like plaque formation upon contrast injection. Access removed. Hemostasis obtained with manual compression. Attention directed to the left common femoral artery site. Under sterile conditions and local anesthesia, ultrasound micropuncture access performed of the left common femoral artery. Guidewire advanced easily. Five French sheath inserted over a Bentson guidewire. The pigtail catheter was inserted into the abdominal aorta. Flush aortogram performed.  Aortogram: Diffuse calcific atherosclerosis. Negative for aneurysm. Celiac, SMA, renal arteries and IMA all appear patent. On the delayed images, there is contrast extravasation in the right abdomen.  Pigtail catheter exchanged for a C2 catheter utilized to select the SMA origin. Selective SMA angiogram performed. There is active contrast extravasation compatible with arterial bleeding from a peripheral right colic branch at the level of the hepatic flexure.  A Renegade micro catheter and micro Glidewire were utilized to select peripheral right colic branches. Selective right colic angiograms performed throughout the hepatic flexure region of the colon. A peripheral right colic branch was eventually catheterize close to the area of active bleeding on previous angiograms. This area was interrogated extensively. 70 mcg nitroglycerin was also given to provoke bleeding in the region. Despite this, no definite active bleeding was demonstrated. However when correlating with the prior study this was close to the original extravasation site. To decrease arterial pressure in the marginal branches, one 3 mm x 6 mm interlock coil was deployed within a peripheral right colic marginal branch.  Micro catheter was retracted. A  second right colic branch was catheterized directed to the hepatic flexure. Angiogram of this area demonstrated collateral communication through marginal branches. From a peripheral right colic branch, active bleeding was still present.  The micro catheter was retracted and advanced into the active bleeding right colic branch over a Glidewire. Access was advanced peripheral within the right colic suspicious branch. Selective angiogram within this third right colic branch confirmed active bleeding within the hepatic flexure of the colon with contrast opacifying the colonic lumen.  From this peripheral location, one 2 mm x 3 mm Interlock coil and two 2 mm x 4 cm interlock coils were deployed in the peripheral right colic branch. Following this, several repeat post embolization angiograms were repeated over approximately 10 min. No further active bleeding was demonstrated.  Catheter removed. Hemostasis obtained with a 6 [place] device. No immediate complication. Patient tolerated the procedure well.  IMPRESSION: Initial SMA angiogram confirms contrast extravasation from a peripheral right colic branch into the hepatic flexure of the colon compatible with arterial bleeding. This correlates with the nuclear medicine scan.  Peripheral micro catheter access was successful at accessing the peripheral SMA right colic branch to perform micro coil embolization. Post embolization, no further active bleeding could be demonstrated.   Electronically Signed   By: TDaryll BrodM.D.   On: 04/29/2013 15:52   [place]  04/29/2013   CLINICAL DATA:  Lower GI acute bleeding requiring blood transfusions, hypotensive, tachycardic, upper endoscopy negative, nuclear medicine bleeding scan positive for right colonic bleeding  EXAM: ULTRASOUND GUIDANCE VASCULAR ACCESS  ABDOMINAL FLUSH AORTOGRAM  SELECTIVE SMA ANGIOGRAM  SELECTIVE PERIPHERAL SMA RIGHT COLIC BRANCH MICRO  CATHETERIZATION AND COIL EMBOLIZATION OF ACUTE ARTERIAL BLEED  Date:  3/12/20153/01/2014 12:19 AM  Radiologist:  Jerilynn Mages. Daryll Brod, MD  Guidance:  Ultrasound and fluoroscopic  FLUOROSCOPY TIME:  26 min 6 seconds  MEDICATIONS AND MEDICAL HISTORY: 2.5 mg Versed, 75 mcg fentanyl  ANESTHESIA/SEDATION: 110 min  CONTRAST:  140m OMNIPAQUE IOHEXOL 300 MG/ML  SOLN  COMPLICATIONS: No immediate  PROCEDURE: Informed consent was obtained from the patient following explanation of the procedure, risks, benefits and alternatives. The patient understands, agrees and consents for the procedure. All questions were addressed. A time out was performed.  Maximal barrier sterile technique utilized including caps, mask, sterile gowns, sterile gloves, large sterile drape, hand hygiene, and Betadine.  Initially, under sterile conditions and local anesthesia, ultrasound right common femoral artery micropuncture access was performed. The micro guidewire would not advance centrally after 3 attempts. This was secondary to shelf-like plaque formation upon contrast injection. Access removed. Hemostasis obtained with manual compression. Attention directed to the left common femoral artery site. Under sterile conditions and local anesthesia, ultrasound micropuncture access performed of the left common femoral artery. Guidewire advanced easily. Five French sheath inserted over a Bentson guidewire. The pigtail catheter was inserted into the abdominal aorta. Flush aortogram performed.  Aortogram: Diffuse calcific atherosclerosis. Negative for aneurysm. Celiac, SMA, renal arteries and IMA all appear patent. On the delayed images, there is contrast extravasation in the right abdomen.  Pigtail catheter exchanged for a C2 catheter utilized to select the SMA origin. Selective SMA angiogram performed. There is active contrast extravasation compatible with arterial bleeding from a peripheral right colic branch at the level of the hepatic flexure.  A Renegade  micro catheter and micro Glidewire were utilized to select peripheral right colic branches. Selective right colic angiograms performed throughout the hepatic flexure region of the colon. A peripheral right colic branch was eventually catheterize close to the area of active bleeding on previous angiograms. This area was interrogated extensively. 70 mcg nitroglycerin was also given to provoke bleeding in the region. Despite this, no definite active bleeding was demonstrated. However when correlating with the prior study this was close to the original extravasation site. To decrease arterial pressure in the marginal branches, one 3 mm x 6 mm interlock coil was deployed within a peripheral right colic marginal branch.  Micro catheter was retracted. A second right colic branch was catheterized directed to the hepatic flexure. Angiogram of this area demonstrated collateral communication through marginal branches. From a peripheral right colic branch, active bleeding was still present.  The micro catheter was retracted and advanced into the active bleeding right colic branch over a Glidewire. Access was advanced peripheral within the right colic suspicious branch. Selective angiogram within this third right colic branch confirmed active bleeding within the hepatic flexure of the colon with contrast opacifying the colonic lumen.  From this peripheral location, one 2 mm x 3 mm Interlock coil and two 2 mm x 4 cm interlock coils were deployed in the peripheral right colic branch. Following this, several repeat post embolization angiograms were repeated over approximately 10 min. No further active bleeding was demonstrated.  Catheter removed. Hemostasis obtained with a 6 [place] device. No immediate complication. Patient tolerated the  procedure well.  IMPRESSION: Initial SMA angiogram confirms contrast extravasation from a peripheral right colic branch into the hepatic flexure of the colon compatible with arterial  bleeding. This correlates with the nuclear medicine scan.  Peripheral micro catheter access was successful at accessing the peripheral SMA right colic branch to perform micro coil embolization. Post embolization, no further active bleeding could be demonstrated.   Electronically Signed   By: Daryll Brod M.D.   On: 04/29/2013 15:52   ASSESSMENT / PLAN:  PULMONARY A:   No active issues P:   Goal SpO2>92 Supplemental oxygen  Pulmonary hygiene  CARDIOVASCULAR A:  NSTEMI vs demand ischemia, troponin peaked Chronic diastolic heart failure Hemorrhagic shock, decreased pressor requirements, lactate reassuring Suspect non invasive BP inaccurate  P:  Cardiology following Change Goal MAP>55 as long as mentating  well and making urine Levophed gtt, titrate to off No ASA - hemorrhage No BB/ACEI - shock / hypotension Keep CVL as unable to establish PIV  RENAL A:   Preserved renal function Metabolic acidosis resolved Hypokalemia Hypovolemia P:   Goal CVP 8-10 Trend BMP NS 500 x 1 D/c Bicarbonate gtt K 40 x 2  GASTROINTESTINAL A:   Diverticular bleed s/p coil embolization  Hx bleeding hemorrhoids GI Px P:   GI / CCS following Diet Protonix  HEMATOLOGIC A:   Acute blood loss anemia, s/p multiple transfusions, stable P:  Goal Hb>=8 Trend CBC change to daily  INFECTIOUS A:   No active issues P:   No intervention required  ENDOCRINE  A:   DM P:   SSI  NEUROLOGIC A:   Fall Syncope Zygomatic Fx P:   PT/OT/OOBTC  I have personally obtained history, examined patient, evaluated and interpreted laboratory and imaging results, reviewed medical records, formulated assessment / plan and placed orders.  CRITICAL CARE:  The patient is critically ill with multiple organ systems failure and requires high complexity decision making for assessment and support, frequent evaluation and titration of therapies, application of advanced monitoring technologies and extensive  interpretation of multiple databases. Critical Care Time devoted to patient care services described in this note is 35 minutes.   Doree Fudge, MD Pulmonary and [place] Pager: [phone]  04/30/2013, 7:09 AM

## 2013-04-30 NOTE — Evaluation (Signed)
Physical Therapy reEvaluation Patient Details Name: Philip Simpson MRN: [id number] DOB: 1922/09/11 Today's Date: 04/30/2013 Time: 1212-1236 PT Time Calculation (min): 24 min  PT Assessment / Plan / Recommendation History of Present Illness  80 y.o. male admitted to [facility] on 04/27/13 with history of hypertension hyperlipidemia prediabetes presents to the ER because of loss of consciousness.  He had head trauma and a facial fx with laceration above the left eye.  NSTEMI with GIB initial PT eval 3/10 with order discontinued same day with transfer to ICU due to hyptension with acute bleeding with hepatic flexion embolization 3/11  Clinical Impression  Pt very pleasant with generalized weakness and great motivation to improve function.  Pt with improved function from initial evaluation with son present throughout session and assisting with chair today. Pt alert and oriented and demonstrates below deficits who will continue to benefit from acute therapy to maximize mobility, function, balance and safety to increase independence and return pt to PLOF. Recommend OOB daily and encouraged pt to perform HEP daily as well. Pt with chronic back pain limiting mobility acutely as well.     PT Assessment  Patient needs continued PT services    Follow Up Recommendations  CIR;Supervision/Assistance - 24 hour    Does the patient have the potential to tolerate intense rehabilitation      Barriers to Discharge Decreased caregiver support      Equipment Recommendations  Rolling walker with 5" wheels;3in1 (PT)    Recommendations for Other Services OT consult;Rehab consult   Frequency Min 3X/week    Precautions / Restrictions Precautions Precautions: Fall   Pertinent Vitals/Pain 102/35 (59) before mobility 97/36 ( 59)after ambulation HR 78 sats 87-97% on RA, 96% on 2L end of session 5/10 back pain throughout      Mobility  Bed Mobility Overal bed mobility: Needs Assistance Bed Mobility:  Rolling;Sidelying to Sit;Sit to Sidelying Rolling: Mod assist Sidelying to sit: Mod assist Sit to sidelying: Min assist General bed mobility comments: cues for sequence, use of rail and assist to rotate pelvis and elevate trunk. Pt reported hip pain in sitting and returned to sidelying prior to sitting a second time for standing Transfers Overall transfer level: Needs assistance Equipment used: Rolling walker (2 wheeled) Transfers: Sit to/from Stand Sit to Stand: Mod assist General transfer comment: cues for hand placement and safety although pt ignoring cues for hand placement for descent and assist to lower to chair Ambulation/Gait Ambulation/Gait assistance: Min assist;+2 safety/equipment Ambulation Distance (Feet): 30 Feet Assistive device: Rolling walker (2 wheeled) Gait Pattern/deviations: Step-through pattern;Decreased stride length;Shuffle;Trunk flexed Gait velocity interpretation: <1.8 ft/sec, indicative of risk for recurrent falls General Gait Details: cues for posture and position in RW with chair pulled behind for fatigue, assist to direct RW and for anterior translation into RW    Exercises General Exercises - Lower Extremity Ankle Circles/Pumps: AROM;Both;20 reps;Seated Heel Slides: AROM;Left;20 reps;Right;5 reps;AAROM (AAROM R x 5, AROM Lx 20) Hip ABduction/ADduction: AROM;Right;5 reps;Seated   PT Diagnosis: Difficulty walking;Abnormality of gait;Generalized weakness  PT Problem List: Decreased strength;Decreased activity tolerance;Decreased balance;Decreased cognition;Decreased mobility;Decreased knowledge of use of DME;Decreased safety awareness;Decreased knowledge of precautions;Cardiopulmonary status limiting activity;Pain PT Treatment Interventions: DME instruction;Gait training;Stair training;Functional mobility training;Therapeutic activities;Therapeutic exercise;Balance training;Neuromuscular re-education;Patient/family education;Cognitive remediation     PT  Goals(Current goals can be found in the care plan section) Acute Rehab PT Goals Patient Stated Goal: be able to return home PT Goal Formulation: With patient/family Time For Goal Achievement: 05/14/13 Potential to Achieve Goals: Good  Visit Information  Last PT Received On: 04/30/13 Assistance Needed: +2 (safety with gait) History of Present Illness: 80 y.o. male admitted to [facility] on 04/27/13 with history of hypertension hyperlipidemia prediabetes presents to the ER because of loss of consciousness.  He had head trauma and a facial fx with laceration above the left eye.  NSTEMI with GIB initial PT eval 3/10 with order discontinued same day with transfer to ICU due to hyptension with acute bleeding with hepatic flexion embolization 3/11       Prior [place] expects to be discharged to:: Private residence Living Arrangements: Alone Available Help at Discharge: Family;Available PRN/intermittently Type of Home: House Home Access: Level entry Home Layout: One level Home Equipment: None Additional Comments: Pt was taking care of himself with periodic assist from son prior to admission Prior Function Level of Independence: Independent Communication Communication: HOH    Cognition  Cognition Arousal/Alertness: Awake/alert Behavior During Therapy: Flat affect Overall Cognitive Status: Within Functional Limits for tasks assessed    Extremity/Trunk Assessment Upper Extremity Assessment Upper Extremity Assessment: Generalized weakness Lower Extremity Assessment Lower Extremity Assessment: Generalized weakness Cervical / Trunk Assessment Cervical / Trunk Assessment: Kyphotic   Balance Balance Overall balance assessment: Needs assistance;History of Falls Sitting-balance support: Bilateral upper extremity supported;Feet supported Sitting balance-Leahy Scale: Fair Standing balance-Leahy Scale: Poor  End of Session PT - End of Session Equipment Utilized During  Treatment: Gait belt Activity Tolerance: Patient limited by pain Patient left: in chair;with call bell/phone within reach;with nursing/sitter in room;with family/visitor present Nurse Communication: Mobility status  GP     Melford Aase 04/30/2013, 12:48 PM Elwyn Reach, [place]

## 2013-04-30 NOTE — Progress Notes (Signed)
[facility] PROGRESS NOTE  Philip Simpson [id number] DOB: 04-23-1922 DOA: 04/25/2013 PCP: Alesia Richards, MD  Assessment/Plan:  Hypovolemic shock.  -Patient having a GI bleed overnight, and continued this morning.  -Systolic blood pressures falling into the 70s for which he was transferred to the intensive care unit.  -Pulmonary critical care medicine involved, started on IV pressors.  -Will continue monitoring H&H, transfuse, IV fluid resuscitation.  -4 unit PRBC 04/27/2013 -3 Unit PRBC 04/28/2013  -Patient greatly improved, however borderline hypotensive while off Levophed -Patient still does not have good by mouth intake, start LR at 75 ml/hr  Upper GI bleed  -Gastroenterology consulted  -Patient becoming unstable after arrival to endoscopy units, could not undergo upper endoscopy as planned  -Continue blood transfusions, endoscopy when hemodynamically stable.  -Continue IV PPI therapy  -nuclear medicine bleeding scan; see results below -SMA embolization performed 04/28/2013 see results will -Increase Protonix to 40 mg BID  Acute hemorrhagic anemia  -Patient's hemoglobin goal and is 6.2 overnight as he was typed and crossed and transfused with packed red blood cells  -Noted by nursing staff to have a red blood, possible this may represent upper GI bleed  -Continue monitoring H&H, transfuse as needed   Syncope  -Initial CT scan of brain negative  -Troponins were cycled, come back positive with peak troponin of 1.55 as cardiology was consulted.  -It is possible underlying anemia may have contributed to syncopal event. However currently patient becomes hypotensive when off Levophed  Hypotension -  Patient continues to be hypotensive requiring Levophed currently at 27 mcg/ min -Counseled patient and son that we would try to continue to titrate him off the Levophed during the day. -Lactated Ringer's at 75 ml/hr (will also help with patient's hypokalemia)  Elevated  troponins/NSTEMI -Could be secondary to demand ischemia from anemia  -Continue blood transfusions (patient received 4 unit PRBC 3/10) -Cardiology following recommends transfuse patient to maintain hemoglobin> 8 -Secondary to patient's hypotension unable to use beta blocker; unable use aspirin or heparin secondary to his GI bleed. -3/13 patient's hemoglobin<8, will transfuse one unit PRBC  Diastolic CHF -Mild; see elevated troponins  Hypokalemia -Potassium goal> 34meq/L -Replete and check in the a.m.  Hypomagnesemia -Magnesium goal> 2mg /dL -Replete and check in the a.m.  Hyperlipidemia  -Will speak with son concerning patient's listed in tolerance of statins   Prediabetes  - Obtain hemoglobin A1c, continue sliding-scale insulin   Nondisplaced Left periorbital fracture  - Evaluated by Dr. Benjamine Mola, does not feel patient needs any surgical intervention at this time and follow up outpatient one week after dc  Right hip pain -start tramadol PRN      Code Status: Full Family Communication: Son present Disposition Plan: Per GI   Consultants: Dr. Delfin Edis (GI) Dr. Merrie Roof (critical-care)   Procedures: IR EMBO ART VEN HEMORR [place] [id number] (Type: Custom)] 04/28/2013  Successful peripheral SMA rt colic branch 3mm and 69mm coil embolization for an acute lower GI bleed angiographically in the hepatic flexure  Nuclear medicine GI blood cell scan 04/28/2013 Abnormal GI bleeding exam demonstrating what appears to be small  bowel localization of tracer in the upper mid abdomen in right upper  quadrant, question duodenal versus proximal jejunal origin.  No definite colonic localization of labeled erythrocytes is  identified.   EGD by Dr. Olevia Perches 04/27/2013 Small reducible hiatal hernia  No blood or bleeding from upper GI tract  No lesions to account for GIB  Shortly following completion of  the EGD, pt evacuated large volume of maroon  stool  Central venous catheter insertion 04/27/2013   Echocardiogram 04/26/2013 Left ventricle: The cavity size was normal. Wall thickness was normal.  -LVEF= 55% to 60%.  -(grade 1 diastolic dysfunction). - Aortic valve: AV is thickened, calcified with minimally restricted motion. - Mitral valve: Mild regurgitation.     Antibiotics:    HPI/Subjective: Patient is an 80 yo gentleman  PMHx  hypertension, dyslipidemia, anemia, admitted to the medicine service on 04/25/2013. Family members that reported that patient was highly functional, able to perform all activities of daily living. It appears on the morning of admission he may have had a syncopal event that resulted in head injury. He was brought to the emergency department where CT scan showed a left paravertebral fracture. Was seen and evaluated by ENT, patient not felt to warrant surgical intervention. On the evening of 04/26/2013 he had 3 episodes of bloody stools. Repeat hemoglobin and hematocrit came back low at 6.2 and 17.7. He was typed and crossed and transfused with one unit of packed red blood cells. On the following morning he was administered a second unit of packed red blood cells. At 11:40 AM on 04/27/2013 his blood pressures have remained stable having blood pressure 120/90 then he was sat up with a rapid fall in his blood pressures to 71/49. Transfusion of packed red blood cells were continued however despite this his blood pressures remained low and could not undergo upper endoscopy. At this point he was transferred to the intensive care unit where he was administered blood and started on IV pressors. Patient's son present at bedside was updated on condition. 3/12 patient in much better spirits today. Answers questions appropriately requested no when he can be discharged. 3/13 patient sitting in chair, eating a small amount of his breakfast. Mentation greatly improved, but still borderline hypotensive off Levophed. Complains of  right hip pain secondary to arthritis which is chronic   Objective: Filed Vitals:   04/30/13 1300 04/30/13 1400 04/30/13 1500 04/30/13 1600  BP:  86/37 93/47 91/40   Pulse: 74 78 206 75  Temp:    98.2 F (36.8 C)  TempSrc:    Oral  Resp:    23  Height:      Weight:      SpO2: 98% 100%  97%    Intake/Output Summary (Last 24 hours) at 04/30/13 1943 Last data filed at 04/30/13 1100  Gross per 24 hour  Intake 1157.6 ml  Output    270 ml  Net  887.6 ml   Filed Weights   04/26/13 0400 04/26/13 2355 04/27/13 0418  Weight: 76.7 kg (169 lb 1.5 oz) 79.153 kg (174 lb 8 oz) 79.334 kg (174 lb 14.4 oz)    Exam:   General: A./O. x4, NAD,   Cardiovascular: Regular rhythm and rate, negative murmurs rubs or gallops  Respiratory: Mild crackles right lower lobe   Abdomen: Soft, nontender, nondistended, normal bowel sounds  Musculoskeletal: Negative pedal edema, , bruise under left thigh consistent with fall/fracture. Pain to palpation in right inguinal area, and pain to fixation right hip consistent with arthritis  Data Reviewed: Basic Metabolic Panel:  Recent Labs Lab 04/27/13 0535 04/28/13 0157 04/29/13 0456 04/29/13 1355 04/29/13 2221 04/30/13 0500  NA 144 146 142  --  139 140  K 3.7 4.6 3.4* 3.3* 3.6* 3.5*  CL 110 115* 112  --  108 107  CO2 18* 16* 14*  --  20 22  GLUCOSE 151*  159* 174*  --  182* 179*  BUN 53* 62* 48*  --  28* 24*  CREATININE 0.90 1.00 0.99  --  0.91 0.85  CALCIUM 8.3* 7.0* 7.5*  --  7.1* 7.2*  MG  --   --  1.7  --   --  2.1   Liver Function Tests:  Recent Labs Lab 04/26/13 0125 04/29/13 0456 04/30/13 0500  AST 18 71* 40*  ALT 10 30 27   ALKPHOS 47 35* 33*  BILITOT 0.3 0.4 0.5  PROT 5.1* 4.1* 4.0*  ALBUMIN 2.7* 2.3* 2.0*   No results found for this basename: LIPASE, AMYLASE,  in the last 168 hours No results found for this basename: AMMONIA,  in the last 168 hours CBC:  Recent Labs Lab 04/25/13 1823 04/26/13 0125  04/29/13 0456  04/29/13 0730 04/29/13 1600 04/29/13 2334 04/30/13 0500  WBC 10.1 7.0  < > 11.0* 10.4 10.2 10.3 10.4  NEUTROABS 9.2* 6.1  --  8.7*  --   --   --  8.7*  HGB 10.0* 8.4*  < > 9.8* 9.2* 7.8* 7.4* 7.7*  HCT 28.8* 24.0*  < > 27.1* 25.2* 21.4* 20.3* 21.3*  MCV 99.0 98.8  < > 88.3 88.4 88.1 88.3 88.4  PLT 178 155  < > 108* 105* 84* 87* 89*  < > = values in this interval not displayed. Cardiac Enzymes:  Recent Labs Lab 04/26/13 0125 04/26/13 0900 04/26/13 1301 04/26/13 2150 04/28/13 1046 04/29/13 0030 04/29/13 0730  CKTOTAL  --  504* 619* 853*  --   --   --   CKMB  --  16.4* 17.3*  --   --   --   --   TROPONINI 0.64* 1.55* 1.54* 2.79* 15.27* 19.26* 14.25*   BNP (last 3 results)  Recent Labs  04/29/13 0730  PROBNP 4803.0*   CBG:  Recent Labs Lab 04/28/13 1818 04/28/13 2027 04/29/13 0105 04/29/13 0346 04/29/13 0810  GLUCAP 166* 155* 173* 173* 135*    Recent Results (from the past 240 hour(s))  MRSA PCR SCREENING     Status: None   Collection Time    04/27/13  2:38 PM      Result Value Ref Range Status   MRSA by PCR NEGATIVE  NEGATIVE Final   Comment:            The GeneXpert MRSA Assay (FDA     approved for NASAL specimens     only), is one component of a     comprehensive MRSA colonization     surveillance program. It is not     intended to diagnose MRSA     infection nor to guide or     monitor treatment for     MRSA infections.     Studies: Ir Angiogram Visceral Selective  04/29/2013   CLINICAL DATA:  Lower GI acute bleeding requiring blood transfusions, hypotensive, tachycardic, upper endoscopy negative, nuclear medicine bleeding scan positive for right colonic bleeding  EXAM: ULTRASOUND GUIDANCE VASCULAR ACCESS  ABDOMINAL FLUSH AORTOGRAM  SELECTIVE SMA ANGIOGRAM  SELECTIVE PERIPHERAL SMA RIGHT COLIC BRANCH MICRO CATHETERIZATION AND COIL EMBOLIZATION OF ACUTE ARTERIAL BLEED  Date:  3/12/20153/01/2014 12:19 AM  Radiologist:  Jerilynn Mages. Daryll Brod, MD  Guidance:   Ultrasound and fluoroscopic  FLUOROSCOPY TIME:  26 min 6 seconds  MEDICATIONS AND MEDICAL HISTORY: 2.5 mg Versed, 75 mcg fentanyl  ANESTHESIA/SEDATION: 110 min  CONTRAST:  135mL OMNIPAQUE IOHEXOL 300 MG/ML  SOLN  COMPLICATIONS: No immediate  PROCEDURE: Informed  consent was obtained from the patient following explanation of the procedure, risks, benefits and alternatives. The patient understands, agrees and consents for the procedure. All questions were addressed. A time out was performed.  Maximal barrier sterile technique utilized including caps, mask, sterile gowns, sterile gloves, large sterile drape, hand hygiene, and Betadine.  Initially, under sterile conditions and local anesthesia, ultrasound right common femoral artery micropuncture access was performed. The micro guidewire would not advance centrally after 3 attempts. This was secondary to shelf-like plaque formation upon contrast injection. Access removed. Hemostasis obtained with manual compression. Attention directed to the left common femoral artery site. Under sterile conditions and local anesthesia, ultrasound micropuncture access performed of the left common femoral artery. Guidewire advanced easily. Five French sheath inserted over a Bentson guidewire. The pigtail catheter was inserted into the abdominal aorta. Flush aortogram performed.  Aortogram: Diffuse calcific atherosclerosis. Negative for aneurysm. Celiac, SMA, renal arteries and IMA all appear patent. On the delayed images, there is contrast extravasation in the right abdomen.  Pigtail catheter exchanged for a C2 catheter utilized to select the SMA origin. Selective SMA angiogram performed. There is active contrast extravasation compatible with arterial bleeding from a peripheral right colic branch at the level of the hepatic flexure.  A Renegade micro catheter and micro Glidewire were utilized to select peripheral right colic branches. Selective right colic angiograms performed throughout  the hepatic flexure region of the colon. A peripheral right colic branch was eventually catheterize close to the area of active bleeding on previous angiograms. This area was interrogated extensively. 70 mcg nitroglycerin was also given to provoke bleeding in the region. Despite this, no definite active bleeding was demonstrated. However when correlating with the prior study this was close to the original extravasation site. To decrease arterial pressure in the marginal branches, one 3 mm x 6 mm interlock coil was deployed within a peripheral right colic marginal branch.  Micro catheter was retracted. A second right colic branch was catheterized directed to the hepatic flexure. Angiogram of this area demonstrated collateral communication through marginal branches. From a peripheral right colic branch, active bleeding was still present.  The micro catheter was retracted and advanced into the active bleeding right colic branch over a Glidewire. Access was advanced peripheral within the right colic suspicious branch. Selective angiogram within this third right colic branch confirmed active bleeding within the hepatic flexure of the colon with contrast opacifying the colonic lumen.  From this peripheral location, one 2 mm x 3 mm Interlock coil and two 2 mm x 4 cm interlock coils were deployed in the peripheral right colic branch. Following this, several repeat post embolization angiograms were repeated over approximately 10 min. No further active bleeding was demonstrated.  Catheter removed. Hemostasis obtained with a 6 [place] Starclose device. No immediate complication. Patient tolerated the procedure well.  IMPRESSION: Initial SMA angiogram confirms contrast extravasation from a peripheral right colic branch into the hepatic flexure of the colon compatible with arterial bleeding. This correlates with the nuclear medicine scan.  Peripheral micro catheter access was successful at accessing the peripheral SMA right colic  branch to perform micro coil embolization. Post embolization, no further active bleeding could be demonstrated.   Electronically Signed   By: Daryll Brod M.D.   On: 04/29/2013 15:52   Ir Angiogram Selective Each Additional Vessel  04/29/2013   CLINICAL DATA:  Lower GI acute bleeding requiring blood transfusions, hypotensive, tachycardic, upper endoscopy negative, nuclear medicine bleeding scan positive for right colonic bleeding  EXAM: ULTRASOUND GUIDANCE VASCULAR ACCESS  ABDOMINAL FLUSH AORTOGRAM  SELECTIVE SMA ANGIOGRAM  SELECTIVE PERIPHERAL SMA RIGHT COLIC BRANCH MICRO CATHETERIZATION AND COIL EMBOLIZATION OF ACUTE ARTERIAL BLEED  Date:  3/12/20153/01/2014 12:19 AM  Radiologist:  Jerilynn Mages. Daryll Brod, MD  Guidance:  Ultrasound and fluoroscopic  FLUOROSCOPY TIME:  26 min 6 seconds  MEDICATIONS AND MEDICAL HISTORY: 2.5 mg Versed, 75 mcg fentanyl  ANESTHESIA/SEDATION: 110 min  CONTRAST:  130mL OMNIPAQUE IOHEXOL 300 MG/ML  SOLN  COMPLICATIONS: No immediate  PROCEDURE: Informed consent was obtained from the patient following explanation of the procedure, risks, benefits and alternatives. The patient understands, agrees and consents for the procedure. All questions were addressed. A time out was performed.  Maximal barrier sterile technique utilized including caps, mask, sterile gowns, sterile gloves, large sterile drape, hand hygiene, and Betadine.  Initially, under sterile conditions and local anesthesia, ultrasound right common femoral artery micropuncture access was performed. The micro guidewire would not advance centrally after 3 attempts. This was secondary to shelf-like plaque formation upon contrast injection. Access removed. Hemostasis obtained with manual compression. Attention directed to the left common femoral artery site. Under sterile conditions and local anesthesia, ultrasound micropuncture access performed of the left common femoral artery. Guidewire advanced easily. Five French sheath inserted over a  Bentson guidewire. The pigtail catheter was inserted into the abdominal aorta. Flush aortogram performed.  Aortogram: Diffuse calcific atherosclerosis. Negative for aneurysm. Celiac, SMA, renal arteries and IMA all appear patent. On the delayed images, there is contrast extravasation in the right abdomen.  Pigtail catheter exchanged for a C2 catheter utilized to select the SMA origin. Selective SMA angiogram performed. There is active contrast extravasation compatible with arterial bleeding from a peripheral right colic branch at the level of the hepatic flexure.  A Renegade micro catheter and micro Glidewire were utilized to select peripheral right colic branches. Selective right colic angiograms performed throughout the hepatic flexure region of the colon. A peripheral right colic branch was eventually catheterize close to the area of active bleeding on previous angiograms. This area was interrogated extensively. 70 mcg nitroglycerin was also given to provoke bleeding in the region. Despite this, no definite active bleeding was demonstrated. However when correlating with the prior study this was close to the original extravasation site. To decrease arterial pressure in the marginal branches, one 3 mm x 6 mm interlock coil was deployed within a peripheral right colic marginal branch.  Micro catheter was retracted. A second right colic branch was catheterized directed to the hepatic flexure. Angiogram of this area demonstrated collateral communication through marginal branches. From a peripheral right colic branch, active bleeding was still present.  The micro catheter was retracted and advanced into the active bleeding right colic branch over a Glidewire. Access was advanced peripheral within the right colic suspicious branch. Selective angiogram within this third right colic branch confirmed active bleeding within the hepatic flexure of the colon with contrast opacifying the colonic lumen.  From this peripheral  location, one 2 mm x 3 mm Interlock coil and two 2 mm x 4 cm interlock coils were deployed in the peripheral right colic branch. Following this, several repeat post embolization angiograms were repeated over approximately 10 min. No further active bleeding was demonstrated.  Catheter removed. Hemostasis obtained with a 6 [place] Starclose device. No immediate complication. Patient tolerated the procedure well.  IMPRESSION: Initial SMA angiogram confirms contrast extravasation from a peripheral right colic branch into the hepatic flexure of the colon compatible with arterial bleeding. This  correlates with the nuclear medicine scan.  Peripheral micro catheter access was successful at accessing the peripheral SMA right colic branch to perform micro coil embolization. Post embolization, no further active bleeding could be demonstrated.   Electronically Signed   By: Daryll Brod M.D.   On: 04/29/2013 15:52   Ir Angiogram Follow Up Study  04/29/2013   CLINICAL DATA:  Lower GI acute bleeding requiring blood transfusions, hypotensive, tachycardic, upper endoscopy negative, nuclear medicine bleeding scan positive for right colonic bleeding  EXAM: ULTRASOUND GUIDANCE VASCULAR ACCESS  ABDOMINAL FLUSH AORTOGRAM  SELECTIVE SMA ANGIOGRAM  SELECTIVE PERIPHERAL SMA RIGHT COLIC BRANCH MICRO CATHETERIZATION AND COIL EMBOLIZATION OF ACUTE ARTERIAL BLEED  Date:  3/12/20153/01/2014 12:19 AM  Radiologist:  Jerilynn Mages. Daryll Brod, MD  Guidance:  Ultrasound and fluoroscopic  FLUOROSCOPY TIME:  26 min 6 seconds  MEDICATIONS AND MEDICAL HISTORY: 2.5 mg Versed, 75 mcg fentanyl  ANESTHESIA/SEDATION: 110 min  CONTRAST:  130mL OMNIPAQUE IOHEXOL 300 MG/ML  SOLN  COMPLICATIONS: No immediate  PROCEDURE: Informed consent was obtained from the patient following explanation of the procedure, risks, benefits and alternatives. The patient understands, agrees and consents for the procedure. All questions were addressed. A time out was performed.  Maximal  barrier sterile technique utilized including caps, mask, sterile gowns, sterile gloves, large sterile drape, hand hygiene, and Betadine.  Initially, under sterile conditions and local anesthesia, ultrasound right common femoral artery micropuncture access was performed. The micro guidewire would not advance centrally after 3 attempts. This was secondary to shelf-like plaque formation upon contrast injection. Access removed. Hemostasis obtained with manual compression. Attention directed to the left common femoral artery site. Under sterile conditions and local anesthesia, ultrasound micropuncture access performed of the left common femoral artery. Guidewire advanced easily. Five French sheath inserted over a Bentson guidewire. The pigtail catheter was inserted into the abdominal aorta. Flush aortogram performed.  Aortogram: Diffuse calcific atherosclerosis. Negative for aneurysm. Celiac, SMA, renal arteries and IMA all appear patent. On the delayed images, there is contrast extravasation in the right abdomen.  Pigtail catheter exchanged for a C2 catheter utilized to select the SMA origin. Selective SMA angiogram performed. There is active contrast extravasation compatible with arterial bleeding from a peripheral right colic branch at the level of the hepatic flexure.  A Renegade micro catheter and micro Glidewire were utilized to select peripheral right colic branches. Selective right colic angiograms performed throughout the hepatic flexure region of the colon. A peripheral right colic branch was eventually catheterize close to the area of active bleeding on previous angiograms. This area was interrogated extensively. 70 mcg nitroglycerin was also given to provoke bleeding in the region. Despite this, no definite active bleeding was demonstrated. However when correlating with the prior study this was close to the original extravasation site. To decrease arterial pressure in the marginal branches, one 3 mm x 6 mm  interlock coil was deployed within a peripheral right colic marginal branch.  Micro catheter was retracted. A second right colic branch was catheterized directed to the hepatic flexure. Angiogram of this area demonstrated collateral communication through marginal branches. From a peripheral right colic branch, active bleeding was still present.  The micro catheter was retracted and advanced into the active bleeding right colic branch over a Glidewire. Access was advanced peripheral within the right colic suspicious branch. Selective angiogram within this third right colic branch confirmed active bleeding within the hepatic flexure of the colon with contrast opacifying the colonic lumen.  From this peripheral location, one 2  mm x 3 mm Interlock coil and two 2 mm x 4 cm interlock coils were deployed in the peripheral right colic branch. Following this, several repeat post embolization angiograms were repeated over approximately 10 min. No further active bleeding was demonstrated.  Catheter removed. Hemostasis obtained with a 6 [place] Starclose device. No immediate complication. Patient tolerated the procedure well.  IMPRESSION: Initial SMA angiogram confirms contrast extravasation from a peripheral right colic branch into the hepatic flexure of the colon compatible with arterial bleeding. This correlates with the nuclear medicine scan.  Peripheral micro catheter access was successful at accessing the peripheral SMA right colic branch to perform micro coil embolization. Post embolization, no further active bleeding could be demonstrated.   Electronically Signed   By: Daryll Brod M.D.   On: 04/29/2013 15:52   Ir US Guide Vasc Access Left  04/29/2013   CLINICAL DATA:  Lower GI acute bleeding requiring blood transfusions, hypotensive, tachycardic, upper endoscopy negative, nuclear medicine bleeding scan positive for right colonic bleeding  EXAM: ULTRASOUND GUIDANCE VASCULAR ACCESS  ABDOMINAL FLUSH AORTOGRAM   SELECTIVE SMA ANGIOGRAM  SELECTIVE PERIPHERAL SMA RIGHT COLIC BRANCH MICRO CATHETERIZATION AND COIL EMBOLIZATION OF ACUTE ARTERIAL BLEED  Date:  3/12/20153/01/2014 12:19 AM  Radiologist:  Jerilynn Mages. Daryll Brod, MD  Guidance:  Ultrasound and fluoroscopic  FLUOROSCOPY TIME:  26 min 6 seconds  MEDICATIONS AND MEDICAL HISTORY: 2.5 mg Versed, 75 mcg fentanyl  ANESTHESIA/SEDATION: 110 min  CONTRAST:  128mL OMNIPAQUE IOHEXOL 300 MG/ML  SOLN  COMPLICATIONS: No immediate  PROCEDURE: Informed consent was obtained from the patient following explanation of the procedure, risks, benefits and alternatives. The patient understands, agrees and consents for the procedure. All questions were addressed. A time out was performed.  Maximal barrier sterile technique utilized including caps, mask, sterile gowns, sterile gloves, large sterile drape, hand hygiene, and Betadine.  Initially, under sterile conditions and local anesthesia, ultrasound right common femoral artery micropuncture access was performed. The micro guidewire would not advance centrally after 3 attempts. This was secondary to shelf-like plaque formation upon contrast injection. Access removed. Hemostasis obtained with manual compression. Attention directed to the left common femoral artery site. Under sterile conditions and local anesthesia, ultrasound micropuncture access performed of the left common femoral artery. Guidewire advanced easily. Five French sheath inserted over a Bentson guidewire. The pigtail catheter was inserted into the abdominal aorta. Flush aortogram performed.  Aortogram: Diffuse calcific atherosclerosis. Negative for aneurysm. Celiac, SMA, renal arteries and IMA all appear patent. On the delayed images, there is contrast extravasation in the right abdomen.  Pigtail catheter exchanged for a C2 catheter utilized to select the SMA origin. Selective SMA angiogram performed. There is active contrast extravasation compatible with arterial bleeding from a  peripheral right colic branch at the level of the hepatic flexure.  A Renegade micro catheter and micro Glidewire were utilized to select peripheral right colic branches. Selective right colic angiograms performed throughout the hepatic flexure region of the colon. A peripheral right colic branch was eventually catheterize close to the area of active bleeding on previous angiograms. This area was interrogated extensively. 70 mcg nitroglycerin was also given to provoke bleeding in the region. Despite this, no definite active bleeding was demonstrated. However when correlating with the prior study this was close to the original extravasation site. To decrease arterial pressure in the marginal branches, one 3 mm x 6 mm interlock coil was deployed within a peripheral right colic marginal branch.  Micro catheter was retracted. A second right colic  branch was catheterized directed to the hepatic flexure. Angiogram of this area demonstrated collateral communication through marginal branches. From a peripheral right colic branch, active bleeding was still present.  The micro catheter was retracted and advanced into the active bleeding right colic branch over a Glidewire. Access was advanced peripheral within the right colic suspicious branch. Selective angiogram within this third right colic branch confirmed active bleeding within the hepatic flexure of the colon with contrast opacifying the colonic lumen.  From this peripheral location, one 2 mm x 3 mm Interlock coil and two 2 mm x 4 cm interlock coils were deployed in the peripheral right colic branch. Following this, several repeat post embolization angiograms were repeated over approximately 10 min. No further active bleeding was demonstrated.  Catheter removed. Hemostasis obtained with a 6 [place] Starclose device. No immediate complication. Patient tolerated the procedure well.  IMPRESSION: Initial SMA angiogram confirms contrast extravasation from a peripheral right  colic branch into the hepatic flexure of the colon compatible with arterial bleeding. This correlates with the nuclear medicine scan.  Peripheral micro catheter access was successful at accessing the peripheral SMA right colic branch to perform micro coil embolization. Post embolization, no further active bleeding could be demonstrated.   Electronically Signed   By: Daryll Brod M.D.   On: 04/29/2013 15:52   [place] [facility] [employer]  04/29/2013   CLINICAL DATA:  Lower GI acute bleeding requiring blood transfusions, hypotensive, tachycardic, upper endoscopy negative, nuclear medicine bleeding scan positive for right colonic bleeding  EXAM: ULTRASOUND GUIDANCE VASCULAR ACCESS  ABDOMINAL FLUSH AORTOGRAM  SELECTIVE SMA ANGIOGRAM  SELECTIVE PERIPHERAL SMA RIGHT COLIC BRANCH MICRO CATHETERIZATION AND COIL EMBOLIZATION OF ACUTE ARTERIAL BLEED  Date:  3/12/20153/01/2014 12:19 AM  Radiologist:  Jerilynn Mages. Daryll Brod, MD  Guidance:  Ultrasound and fluoroscopic  FLUOROSCOPY TIME:  26 min 6 seconds  MEDICATIONS AND MEDICAL HISTORY: 2.5 mg Versed, 75 mcg fentanyl  ANESTHESIA/SEDATION: 110 min  CONTRAST:  142mL OMNIPAQUE IOHEXOL 300 MG/ML  SOLN  COMPLICATIONS: No immediate  PROCEDURE: Informed consent was obtained from the patient following explanation of the procedure, risks, benefits and alternatives. The patient understands, agrees and consents for the procedure. All questions were addressed. A time out was performed.  Maximal barrier sterile technique utilized including caps, mask, sterile gowns, sterile gloves, large sterile drape, hand hygiene, and Betadine.  Initially, under sterile conditions and local anesthesia, ultrasound right common femoral artery micropuncture access was performed. The micro guidewire would not advance centrally after 3 attempts. This was secondary to shelf-like plaque formation upon contrast injection. Access removed. Hemostasis obtained with manual compression.  Attention directed to the left common femoral artery site. Under sterile conditions and local anesthesia, ultrasound micropuncture access performed of the left common femoral artery. Guidewire advanced easily. Five French sheath inserted over a Bentson guidewire. The pigtail catheter was inserted into the abdominal aorta. Flush aortogram performed.  Aortogram: Diffuse calcific atherosclerosis. Negative for aneurysm. Celiac, SMA, renal arteries and IMA all appear patent. On the delayed images, there is contrast extravasation in the right abdomen.  Pigtail catheter exchanged for a C2 catheter utilized to select the SMA origin. Selective SMA angiogram performed. There is active contrast extravasation compatible with arterial bleeding from a peripheral right colic branch at the level of the hepatic flexure.  A Renegade micro catheter and micro Glidewire were utilized to select peripheral right colic branches. Selective right colic angiograms performed throughout the hepatic flexure region of  the colon. A peripheral right colic branch was eventually catheterize close to the area of active bleeding on previous angiograms. This area was interrogated extensively. 70 mcg nitroglycerin was also given to provoke bleeding in the region. Despite this, no definite active bleeding was demonstrated. However when correlating with the prior study this was close to the original extravasation site. To decrease arterial pressure in the marginal branches, one 3 mm x 6 mm interlock coil was deployed within a peripheral right colic marginal branch.  Micro catheter was retracted. A second right colic branch was catheterized directed to the hepatic flexure. Angiogram of this area demonstrated collateral communication through marginal branches. From a peripheral right colic branch, active bleeding was still present.  The micro catheter was retracted and advanced into the active bleeding right colic branch over a Glidewire. Access was advanced  peripheral within the right colic suspicious branch. Selective angiogram within this third right colic branch confirmed active bleeding within the hepatic flexure of the colon with contrast opacifying the colonic lumen.  From this peripheral location, one 2 mm x 3 mm Interlock coil and two 2 mm x 4 cm interlock coils were deployed in the peripheral right colic branch. Following this, several repeat post embolization angiograms were repeated over approximately 10 min. No further active bleeding was demonstrated.  Catheter removed. Hemostasis obtained with a 6 [place] Starclose device. No immediate complication. Patient tolerated the procedure well.  IMPRESSION: Initial SMA angiogram confirms contrast extravasation from a peripheral right colic branch into the hepatic flexure of the colon compatible with arterial bleeding. This correlates with the nuclear medicine scan.  Peripheral micro catheter access was successful at accessing the peripheral SMA right colic branch to perform micro coil embolization. Post embolization, no further active bleeding could be demonstrated.   Electronically Signed   By: Daryll Brod M.D.   On: 04/29/2013 15:52    Scheduled Meds: . cholecalciferol  400 Units Oral Daily  . feeding supplement (RESOURCE BREEZE)  1 Container Oral BID BM  . [START ON 05/01/2013] pantoprazole  40 mg Oral Daily  . sodium chloride  500 mL Intravenous Once   Continuous Infusions: . sodium chloride 10 mL/hr (04/29/13 0200)  . lactated ringers      Principal Problem:   Upper GI bleed Active Problems:   Hyperlipidemia   Hypertension   Prediabetes   Syncope   Orbital fracture   Anemia   Hemorrhage of gastrointestinal tract, unspecified   Hemorrhagic shock   NSTEMI (non-ST elevated myocardial infarction)   Nonspecific (abnormal) findings on radiological and other examination of gastrointestinal tract   Diverticular hemorrhage   Diastolic CHF   Hypokalemia   Hypomagnesemia   Diverticulitis  of colon (without mention of hemorrhage)   Hip pain, right    Time spent: 35 minute   WOODS, [place], J  [facility] Pager [phone]. If 7PM-7AM, please contact night-coverage at [email or web address], password [facility] 04/30/2013, 7:43 PM  LOS: 5 days

## 2013-05-01 LAB — COMPREHENSIVE METABOLIC PANEL
ALBUMIN: 1.9 g/dL — AB (ref 3.5–5.2)
ALK PHOS: 35 U/L — AB (ref 39–117)
ALT: 25 U/L (ref 0–53)
AST: 31 U/L (ref 0–37)
BUN: 25 mg/dL — ABNORMAL HIGH (ref 6–23)
CO2: 21 mEq/L (ref 19–32)
Calcium: 7.4 mg/dL — ABNORMAL LOW (ref 8.4–10.5)
Chloride: 110 mEq/L (ref 96–112)
Creatinine, Ser: 0.83 mg/dL (ref 0.50–1.35)
GFR calc non Af Amer: 76 mL/min — ABNORMAL LOW (ref >90)
GFR, EST AFRICAN AMERICAN: 88 mL/min — AB (ref >90)
GLUCOSE: 137 mg/dL — AB (ref 70–99)
POTASSIUM: 4.7 meq/L (ref 3.7–5.3)
SODIUM: 140 meq/L (ref 137–147)
Total Bilirubin: 0.4 mg/dL (ref 0.3–1.2)
Total Protein: 4.2 g/dL — ABNORMAL LOW (ref 6.0–8.3)

## 2013-05-01 LAB — CBC WITH DIFFERENTIAL/PLATELET
BASOS ABS: 0 10*3/uL (ref 0.0–0.1)
Basophils Relative: 0 % (ref 0–1)
EOS ABS: 0.2 10*3/uL (ref 0.0–0.7)
EOS PCT: 2 % (ref 0–5)
HCT: 22.8 % — ABNORMAL LOW (ref 39.0–52.0)
Hemoglobin: 8.1 g/dL — ABNORMAL LOW (ref 13.0–17.0)
Lymphocytes Relative: 6 % — ABNORMAL LOW (ref 12–46)
Lymphs Abs: 0.5 10*3/uL — ABNORMAL LOW (ref 0.7–4.0)
MCH: 31.6 pg (ref 26.0–34.0)
MCHC: 35.5 g/dL (ref 30.0–36.0)
MCV: 89.1 fL (ref 78.0–100.0)
Monocytes Absolute: 0.4 10*3/uL (ref 0.1–1.0)
Monocytes Relative: 5 % (ref 3–12)
Neutro Abs: 6.7 10*3/uL (ref 1.7–7.7)
Neutrophils Relative %: 86 % — ABNORMAL HIGH (ref 43–77)
Platelets: 73 10*3/uL — ABNORMAL LOW (ref 150–400)
RBC: 2.56 MIL/uL — ABNORMAL LOW (ref 4.22–5.81)
RDW: 17.4 % — AB (ref 11.5–15.5)
WBC: 7.8 10*3/uL (ref 4.0–10.5)

## 2013-05-01 LAB — CBC
HCT: 28.7 % — ABNORMAL LOW (ref 39.0–52.0)
Hemoglobin: 10 g/dL — ABNORMAL LOW (ref 13.0–17.0)
MCH: 31 pg (ref 26.0–34.0)
MCHC: 34.8 g/dL (ref 30.0–36.0)
MCV: 88.9 fL (ref 78.0–100.0)
PLATELETS: 91 10*3/uL — AB (ref 150–400)
RBC: 3.23 MIL/uL — ABNORMAL LOW (ref 4.22–5.81)
RDW: 17.6 % — AB (ref 11.5–15.5)
WBC: 10.3 10*3/uL (ref 4.0–10.5)

## 2013-05-01 LAB — PREPARE RBC (CROSSMATCH)

## 2013-05-01 LAB — MAGNESIUM: Magnesium: 2.3 mg/dL (ref 1.5–2.5)

## 2013-05-01 MED ORDER — DEXTROSE-NACL 5-0.2 % IV SOLN
INTRAVENOUS | Status: DC
Start: 2013-05-01 — End: 2013-05-05
  Administered 2013-05-01: 10:00:00 via INTRAVENOUS
  Administered 2013-05-02: 75 mL via INTRAVENOUS
  Administered 2013-05-03: 07:00:00 via INTRAVENOUS
  Filled 2013-05-01: qty 1000

## 2013-05-01 NOTE — Progress Notes (Signed)
CSW (Clinical Education officer, museum) spoke with pt about SNF bed offers. Pt has hesitations about "going to one of those dirty nursing homes" but is going to speak with his son. CSW also called pt son to inform him that bed offers were left in pt room. Pt son thankful for all that [facility] staff is doing.   [place], [place]

## 2013-05-01 NOTE — Progress Notes (Signed)
3 Days Post-Op  Subjective: No acute changes  Objective: Vital signs in last 24 hours: Temp:  [97.1 F (36.2 C)-98.2 F (36.8 C)] 98.1 F (36.7 C) (03/14 0444) Pulse Rate:  [33-206] 60 (03/14 0700) Resp:  [13-23] 14 (03/14 0700) BP: (80-121)/(19-66) 121/44 mmHg (03/14 0700) SpO2:  [82 %-100 %] 100 % (03/14 0700) Last BM Date: 04/27/13  Intake/Output from previous day: 03/13 0701 - 03/14 0700 In: 931.5 [I.V.:605.7; Blood:325.8] Out: 495 [Urine:495] Intake/Output this shift:    General appearance: alert and cooperative GI: soft, non-tender; bowel sounds normal; no masses,  no organomegaly  Lab Results:   Recent Labs  04/30/13 0500 05/01/13 0500  WBC 10.4 7.8  HGB 7.7* 8.1*  HCT 21.3* 22.8*  PLT 89* 73*   BMET  Recent Labs  04/30/13 0500 05/01/13 0500  NA 140 140  K 3.5* 4.7  CL 107 110  CO2 22 21  GLUCOSE 179* 137*  BUN 24* 25*  CREATININE 0.85 0.83  CALCIUM 7.2* 7.4*   PT/INR No results found for this basename: LABPROT, INR,  in the last 72 hours ABG No results found for this basename: PHART, PCO2, PO2, HCO3,  in the last 72 hours  Studies/Results: No results found.  Anti-infectives: Anti-infectives   None      Assessment/Plan: LGIB s/p IR embo of R colic artery  Hct stable No further LGIB at this time Call with questions  LOS: 6 days    Rosario Jacks., [facility] 05/01/2013

## 2013-05-01 NOTE — Progress Notes (Addendum)
SUBJECTIVE:  No complaints except being hungry  OBJECTIVE:   Vitals:   Filed Vitals:   05/01/13 0600 05/01/13 0700 05/01/13 0800 05/01/13 0826  BP: 118/46 121/44 119/51 119/51  Pulse: 50 60 63 69  Temp:    98 F (36.7 C)  TempSrc:    Oral  Resp: 15 14 14 16   Height:      Weight:      SpO2: 99% 100% 100% 97%   I&O's:   Intake/Output Summary (Last 24 hours) at 05/01/13 9021 Last data filed at 05/01/13 0850  Gross per 24 hour  Intake 1353.33 ml  Output    495 ml  Net 858.33 ml   TELEMETRY: Reviewed telemetry pt in NSR:     PHYSICAL EXAM General: Well developed, well nourished, in no acute distress Head: Eyes PERRLA, No xanthomas.   Normal cephalic and atramatic  Lungs:   Clear bilaterally to auscultation and percussion. Heart:   HRRR S1 S2 Pulses are 2+ & equal. Abdomen: Bowel sounds are positive, abdomen soft and non-tender without masses Extremities:   No clubbing, cyanosis or edema.  DP +1 Neuro: Alert and oriented X 3. Psych:  Good affect, responds appropriately   LABS: Basic Metabolic Panel:  Recent Labs  04/30/13 0500 05/01/13 0500  NA 140 140  K 3.5* 4.7  CL 107 110  CO2 22 21  GLUCOSE 179* 137*  BUN 24* 25*  CREATININE 0.85 0.83  CALCIUM 7.2* 7.4*  MG 2.1 2.3   Liver Function Tests:  Recent Labs  04/30/13 0500 05/01/13 0500  AST 40* 31  ALT 27 25  ALKPHOS 33* 35*  BILITOT 0.5 0.4  PROT 4.0* 4.2*  ALBUMIN 2.0* 1.9*   No results found for this basename: LIPASE, AMYLASE,  in the last 72 hours CBC:  Recent Labs  04/30/13 0500 05/01/13 0500  WBC 10.4 7.8  NEUTROABS 8.7* 6.7  HGB 7.7* 8.1*  HCT 21.3* 22.8*  MCV 88.4 89.1  PLT 89* 73*   Cardiac Enzymes:  Recent Labs  04/28/13 1046 04/29/13 0030 04/29/13 0730  TROPONINI 15.27* 19.26* 14.25*   BNP: No components found with this basename: POCBNP,  D-Dimer: No results found for this basename: DDIMER,  in the last 72 hours Hemoglobin A1C: No results found for this basename:  HGBA1C,  in the last 72 hours Fasting Lipid Panel: No results found for this basename: CHOL, HDL, LDLCALC, TRIG, CHOLHDL, LDLDIRECT,  in the last 72 hours Thyroid Function Tests: No results found for this basename: TSH, T4TOTAL, FREET3, T3FREE, THYROIDAB,  in the last 72 hours Anemia Panel: No results found for this basename: VITAMINB12, FOLATE, FERRITIN, TIBC, IRON, RETICCTPCT,  in the last 72 hours Coag Panel:   Lab Results  Component Value Date   INR 1.24 04/27/2013    RADIOLOGY: Ct Head Wo Contrast  04/25/2013   CLINICAL DATA:  Fall.  Bruising to left orbit.  EXAM: CT HEAD WITHOUT CONTRAST  CT MAXILLOFACIAL WITHOUT CONTRAST  TECHNIQUE: Multidetector CT imaging of the head and maxillofacial structures were performed using the standard protocol without intravenous contrast. Multiplanar CT image reconstructions of the maxillofacial structures were also generated.  COMPARISON:  None.  FINDINGS: CT HEAD FINDINGS  Diffuse, extensive cerebral atrophy. Dilated extra-axial CSF spaces over the cerebral hemispheres bilaterally most likely related to the volume loss, or this could be related to chronic subdural hygromas. No hemorrhage. No mass lesion or acute infarction. No hydrocephalus.  No acute calvarial abnormality. Left orbital fractures noted. See maxillofacial  report below.  CT MAXILLOFACIAL FINDINGS  Fracture through the left lateral orbital wall and orbital floor. Probable nondisplaced zygomatic arch fracture on the left. Fracture also noted posteriorly within the lateral posterior orbital wall on image 68 of series 6. This is also noted on image 71 of series 6. The fracture line continues laterally into the sphenoid wing and superiorly into the squamosal portion of the left temporal bone.  Small amount of fluid noted in the left maxillary sinus. Remainder the paranasal sinuses are clear. Mastoids are clear. Globes are intact.  IMPRESSION: Diffuse cerebral atrophy. Prominent extra-axial low-density  fluid likely related to the atrophy although these could represent chronic subdural hygromas.  No acute intracranial abnormality.  Complex left orbital fracture including anterior and posterior lateral walls, extending laterally into the sphenoid wing, and likely into the squamosal portion of the temporal bone. Left orbital floor fracture. Nondisplaced zygomatic arch fracture.   Electronically Signed   By: Rolm Baptise M.D.   On: 04/25/2013 17:32   Nm Gi Blood Loss  04/28/2013   CLINICAL DATA Hematochezia  EXAM NUCLEAR MEDICINE GASTROINTESTINAL BLEEDING SCAN  TECHNIQUE Sequential abdominal images were obtained following intravenous administration of Tc-25mlabeled red blood cells.  RADIOPHARMACEUTICALS 25 MILLI CURIE ULTRATAG TECHNETIUM TC 55M-LABELED RED BLOOD CELLS IV KIT  COMPARISON None  FINDINGS Abnormal red cell localization is identified in the right upper quadrant beginning at 31 min.  This abnormal tracer continues through multiple loops of bowel in the mid abdomen and right upper quadrant.  This probably represents a duodenal or proximal jejunal bleeding source.  No tracer is seen along the expected course of the colon.  IMPRESSION Abnormal GI bleeding exam demonstrating what appears to be small bowel localization of tracer in the upper mid abdomen in right upper quadrant, question duodenal versus proximal jejunal origin.  No definite colonic localization of labeled erythrocytes is identified.  SIGNATURE  Electronically Signed   By: MLavonia DanaM.D.   On: 04/28/2013 13:14   Ir Angiogram Visceral Selective  04/29/2013   CLINICAL DATA:  Lower GI acute bleeding requiring blood transfusions, hypotensive, tachycardic, upper endoscopy negative, nuclear medicine bleeding scan positive for right colonic bleeding  EXAM: ULTRASOUND GUIDANCE VASCULAR ACCESS  ABDOMINAL FLUSH AORTOGRAM  SELECTIVE SMA ANGIOGRAM  SELECTIVE PERIPHERAL SMA RIGHT COLIC BRANCH MICRO CATHETERIZATION AND COIL EMBOLIZATION OF ACUTE  ARTERIAL BLEED  Date:  3/12/20153/01/2014 12:19 AM  Radiologist:  MJerilynn Mages TDaryll Brod MD  Guidance:  Ultrasound and fluoroscopic  FLUOROSCOPY TIME:  26 min 6 seconds  MEDICATIONS AND MEDICAL HISTORY: 2.5 mg Versed, 75 mcg fentanyl  ANESTHESIA/SEDATION: 110 min  CONTRAST:  1135mOMNIPAQUE IOHEXOL 300 MG/ML  SOLN  COMPLICATIONS: No immediate  PROCEDURE: Informed consent was obtained from the patient following explanation of the procedure, risks, benefits and alternatives. The patient understands, agrees and consents for the procedure. All questions were addressed. A time out was performed.  Maximal barrier sterile technique utilized including caps, mask, sterile gowns, sterile gloves, large sterile drape, hand hygiene, and Betadine.  Initially, under sterile conditions and local anesthesia, ultrasound right common femoral artery micropuncture access was performed. The micro guidewire would not advance centrally after 3 attempts. This was secondary to shelf-like plaque formation upon contrast injection. Access removed. Hemostasis obtained with manual compression. Attention directed to the left common femoral artery site. Under sterile conditions and local anesthesia, ultrasound micropuncture access performed of the left common femoral artery. Guidewire advanced easily. Five French sheath inserted over a Bentson guidewire. The pigtail  catheter was inserted into the abdominal aorta. Flush aortogram performed.  Aortogram: Diffuse calcific atherosclerosis. Negative for aneurysm. Celiac, SMA, renal arteries and IMA all appear patent. On the delayed images, there is contrast extravasation in the right abdomen.  Pigtail catheter exchanged for a C2 catheter utilized to select the SMA origin. Selective SMA angiogram performed. There is active contrast extravasation compatible with arterial bleeding from a peripheral right colic branch at the level of the hepatic flexure.  A Renegade micro catheter and micro Glidewire were utilized  to select peripheral right colic branches. Selective right colic angiograms performed throughout the hepatic flexure region of the colon. A peripheral right colic branch was eventually catheterize close to the area of active bleeding on previous angiograms. This area was interrogated extensively. 70 mcg nitroglycerin was also given to provoke bleeding in the region. Despite this, no definite active bleeding was demonstrated. However when correlating with the prior study this was close to the original extravasation site. To decrease arterial pressure in the marginal branches, one 3 mm x 6 mm interlock coil was deployed within a peripheral right colic marginal branch.  Micro catheter was retracted. A second right colic branch was catheterized directed to the hepatic flexure. Angiogram of this area demonstrated collateral communication through marginal branches. From a peripheral right colic branch, active bleeding was still present.  The micro catheter was retracted and advanced into the active bleeding right colic branch over a Glidewire. Access was advanced peripheral within the right colic suspicious branch. Selective angiogram within this third right colic branch confirmed active bleeding within the hepatic flexure of the colon with contrast opacifying the colonic lumen.  From this peripheral location, one 2 mm x 3 mm Interlock coil and two 2 mm x 4 cm interlock coils were deployed in the peripheral right colic branch. Following this, several repeat post embolization angiograms were repeated over approximately 10 min. No further active bleeding was demonstrated.  Catheter removed. Hemostasis obtained with a 6 [place] Starclose device. No immediate complication. Patient tolerated the procedure well.  IMPRESSION: Initial SMA angiogram confirms contrast extravasation from a peripheral right colic branch into the hepatic flexure of the colon compatible with arterial bleeding. This correlates with the nuclear medicine  scan.  Peripheral micro catheter access was successful at accessing the peripheral SMA right colic branch to perform micro coil embolization. Post embolization, no further active bleeding could be demonstrated.   Electronically Signed   By: Daryll Brod M.D.   On: 04/29/2013 15:52   Ir Angiogram Selective Each Additional Vessel  04/29/2013   CLINICAL DATA:  Lower GI acute bleeding requiring blood transfusions, hypotensive, tachycardic, upper endoscopy negative, nuclear medicine bleeding scan positive for right colonic bleeding  EXAM: ULTRASOUND GUIDANCE VASCULAR ACCESS  ABDOMINAL FLUSH AORTOGRAM  SELECTIVE SMA ANGIOGRAM  SELECTIVE PERIPHERAL SMA RIGHT COLIC BRANCH MICRO CATHETERIZATION AND COIL EMBOLIZATION OF ACUTE ARTERIAL BLEED  Date:  3/12/20153/01/2014 12:19 AM  Radiologist:  Jerilynn Mages. Daryll Brod, MD  Guidance:  Ultrasound and fluoroscopic  FLUOROSCOPY TIME:  26 min 6 seconds  MEDICATIONS AND MEDICAL HISTORY: 2.5 mg Versed, 75 mcg fentanyl  ANESTHESIA/SEDATION: 110 min  CONTRAST:  160m OMNIPAQUE IOHEXOL 300 MG/ML  SOLN  COMPLICATIONS: No immediate  PROCEDURE: Informed consent was obtained from the patient following explanation of the procedure, risks, benefits and alternatives. The patient understands, agrees and consents for the procedure. All questions were addressed. A time out was performed.  Maximal barrier sterile technique utilized including caps, mask, sterile gowns, sterile gloves, large  sterile drape, hand hygiene, and Betadine.  Initially, under sterile conditions and local anesthesia, ultrasound right common femoral artery micropuncture access was performed. The micro guidewire would not advance centrally after 3 attempts. This was secondary to shelf-like plaque formation upon contrast injection. Access removed. Hemostasis obtained with manual compression. Attention directed to the left common femoral artery site. Under sterile conditions and local anesthesia, ultrasound micropuncture access  performed of the left common femoral artery. Guidewire advanced easily. Five French sheath inserted over a Bentson guidewire. The pigtail catheter was inserted into the abdominal aorta. Flush aortogram performed.  Aortogram: Diffuse calcific atherosclerosis. Negative for aneurysm. Celiac, SMA, renal arteries and IMA all appear patent. On the delayed images, there is contrast extravasation in the right abdomen.  Pigtail catheter exchanged for a C2 catheter utilized to select the SMA origin. Selective SMA angiogram performed. There is active contrast extravasation compatible with arterial bleeding from a peripheral right colic branch at the level of the hepatic flexure.  A Renegade micro catheter and micro Glidewire were utilized to select peripheral right colic branches. Selective right colic angiograms performed throughout the hepatic flexure region of the colon. A peripheral right colic branch was eventually catheterize close to the area of active bleeding on previous angiograms. This area was interrogated extensively. 70 mcg nitroglycerin was also given to provoke bleeding in the region. Despite this, no definite active bleeding was demonstrated. However when correlating with the prior study this was close to the original extravasation site. To decrease arterial pressure in the marginal branches, one 3 mm x 6 mm interlock coil was deployed within a peripheral right colic marginal branch.  Micro catheter was retracted. A second right colic branch was catheterized directed to the hepatic flexure. Angiogram of this area demonstrated collateral communication through marginal branches. From a peripheral right colic branch, active bleeding was still present.  The micro catheter was retracted and advanced into the active bleeding right colic branch over a Glidewire. Access was advanced peripheral within the right colic suspicious branch. Selective angiogram within this third right colic branch confirmed active bleeding  within the hepatic flexure of the colon with contrast opacifying the colonic lumen.  From this peripheral location, one 2 mm x 3 mm Interlock coil and two 2 mm x 4 cm interlock coils were deployed in the peripheral right colic branch. Following this, several repeat post embolization angiograms were repeated over approximately 10 min. No further active bleeding was demonstrated.  Catheter removed. Hemostasis obtained with a 6 [place] Starclose device. No immediate complication. Patient tolerated the procedure well.  IMPRESSION: Initial SMA angiogram confirms contrast extravasation from a peripheral right colic branch into the hepatic flexure of the colon compatible with arterial bleeding. This correlates with the nuclear medicine scan.  Peripheral micro catheter access was successful at accessing the peripheral SMA right colic branch to perform micro coil embolization. Post embolization, no further active bleeding could be demonstrated.   Electronically Signed   By: Daryll Brod M.D.   On: 04/29/2013 15:52   Ir Angiogram Follow Up Study  04/29/2013   CLINICAL DATA:  Lower GI acute bleeding requiring blood transfusions, hypotensive, tachycardic, upper endoscopy negative, nuclear medicine bleeding scan positive for right colonic bleeding  EXAM: ULTRASOUND GUIDANCE VASCULAR ACCESS  ABDOMINAL FLUSH AORTOGRAM  SELECTIVE SMA ANGIOGRAM  SELECTIVE PERIPHERAL SMA RIGHT COLIC BRANCH MICRO CATHETERIZATION AND COIL EMBOLIZATION OF ACUTE ARTERIAL BLEED  Date:  3/12/20153/01/2014 12:19 AM  Radiologist:  Jerilynn Mages. Daryll Brod, MD  Guidance:  Ultrasound and fluoroscopic  FLUOROSCOPY TIME:  26 min 6 seconds  MEDICATIONS AND MEDICAL HISTORY: 2.5 mg Versed, 75 mcg fentanyl  ANESTHESIA/SEDATION: 110 min  CONTRAST:  156m OMNIPAQUE IOHEXOL 300 MG/ML  SOLN  COMPLICATIONS: No immediate  PROCEDURE: Informed consent was obtained from the patient following explanation of the procedure, risks, benefits and alternatives. The patient understands,  agrees and consents for the procedure. All questions were addressed. A time out was performed.  Maximal barrier sterile technique utilized including caps, mask, sterile gowns, sterile gloves, large sterile drape, hand hygiene, and Betadine.  Initially, under sterile conditions and local anesthesia, ultrasound right common femoral artery micropuncture access was performed. The micro guidewire would not advance centrally after 3 attempts. This was secondary to shelf-like plaque formation upon contrast injection. Access removed. Hemostasis obtained with manual compression. Attention directed to the left common femoral artery site. Under sterile conditions and local anesthesia, ultrasound micropuncture access performed of the left common femoral artery. Guidewire advanced easily. Five French sheath inserted over a Bentson guidewire. The pigtail catheter was inserted into the abdominal aorta. Flush aortogram performed.  Aortogram: Diffuse calcific atherosclerosis. Negative for aneurysm. Celiac, SMA, renal arteries and IMA all appear patent. On the delayed images, there is contrast extravasation in the right abdomen.  Pigtail catheter exchanged for a C2 catheter utilized to select the SMA origin. Selective SMA angiogram performed. There is active contrast extravasation compatible with arterial bleeding from a peripheral right colic branch at the level of the hepatic flexure.  A Renegade micro catheter and micro Glidewire were utilized to select peripheral right colic branches. Selective right colic angiograms performed throughout the hepatic flexure region of the colon. A peripheral right colic branch was eventually catheterize close to the area of active bleeding on previous angiograms. This area was interrogated extensively. 70 mcg nitroglycerin was also given to provoke bleeding in the region. Despite this, no definite active bleeding was demonstrated. However when correlating with the prior study this was close to the  original extravasation site. To decrease arterial pressure in the marginal branches, one 3 mm x 6 mm interlock coil was deployed within a peripheral right colic marginal branch.  Micro catheter was retracted. A second right colic branch was catheterized directed to the hepatic flexure. Angiogram of this area demonstrated collateral communication through marginal branches. From a peripheral right colic branch, active bleeding was still present.  The micro catheter was retracted and advanced into the active bleeding right colic branch over a Glidewire. Access was advanced peripheral within the right colic suspicious branch. Selective angiogram within this third right colic branch confirmed active bleeding within the hepatic flexure of the colon with contrast opacifying the colonic lumen.  From this peripheral location, one 2 mm x 3 mm Interlock coil and two 2 mm x 4 cm interlock coils were deployed in the peripheral right colic branch. Following this, several repeat post embolization angiograms were repeated over approximately 10 min. No further active bleeding was demonstrated.  Catheter removed. Hemostasis obtained with a 6 [place] device. No immediate complication. Patient tolerated the procedure well.  IMPRESSION: Initial SMA angiogram confirms contrast extravasation from a peripheral right colic branch into the hepatic flexure of the colon compatible with arterial bleeding. This correlates with the nuclear medicine scan.  Peripheral micro catheter access was successful at accessing the peripheral SMA right colic branch to perform micro coil embolization. Post embolization, no further active bleeding could be demonstrated.   Electronically Signed   By: TDaryll BrodM.D.   On:  04/29/2013 15:52   Ir US Guide Vasc Access Left  04/29/2013   CLINICAL DATA:  Lower GI acute bleeding requiring blood transfusions, hypotensive, tachycardic, upper endoscopy negative, nuclear medicine bleeding scan positive for  right colonic bleeding  EXAM: ULTRASOUND GUIDANCE VASCULAR ACCESS  ABDOMINAL FLUSH AORTOGRAM  SELECTIVE SMA ANGIOGRAM  SELECTIVE PERIPHERAL SMA RIGHT COLIC BRANCH MICRO CATHETERIZATION AND COIL EMBOLIZATION OF ACUTE ARTERIAL BLEED  Date:  3/12/20153/01/2014 12:19 AM  Radiologist:  Jerilynn Mages. Daryll Brod, MD  Guidance:  Ultrasound and fluoroscopic  FLUOROSCOPY TIME:  26 min 6 seconds  MEDICATIONS AND MEDICAL HISTORY: 2.5 mg Versed, 75 mcg fentanyl  ANESTHESIA/SEDATION: 110 min  CONTRAST:  1108m OMNIPAQUE IOHEXOL 300 MG/ML  SOLN  COMPLICATIONS: No immediate  PROCEDURE: Informed consent was obtained from the patient following explanation of the procedure, risks, benefits and alternatives. The patient understands, agrees and consents for the procedure. All questions were addressed. A time out was performed.  Maximal barrier sterile technique utilized including caps, mask, sterile gowns, sterile gloves, large sterile drape, hand hygiene, and Betadine.  Initially, under sterile conditions and local anesthesia, ultrasound right common femoral artery micropuncture access was performed. The micro guidewire would not advance centrally after 3 attempts. This was secondary to shelf-like plaque formation upon contrast injection. Access removed. Hemostasis obtained with manual compression. Attention directed to the left common femoral artery site. Under sterile conditions and local anesthesia, ultrasound micropuncture access performed of the left common femoral artery. Guidewire advanced easily. Five French sheath inserted over a Bentson guidewire. The pigtail catheter was inserted into the abdominal aorta. Flush aortogram performed.  Aortogram: Diffuse calcific atherosclerosis. Negative for aneurysm. Celiac, SMA, renal arteries and IMA all appear patent. On the delayed images, there is contrast extravasation in the right abdomen.  Pigtail catheter exchanged for a C2 catheter utilized to select the SMA origin. Selective SMA angiogram  performed. There is active contrast extravasation compatible with arterial bleeding from a peripheral right colic branch at the level of the hepatic flexure.  A Renegade micro catheter and micro Glidewire were utilized to select peripheral right colic branches. Selective right colic angiograms performed throughout the hepatic flexure region of the colon. A peripheral right colic branch was eventually catheterize close to the area of active bleeding on previous angiograms. This area was interrogated extensively. 70 mcg nitroglycerin was also given to provoke bleeding in the region. Despite this, no definite active bleeding was demonstrated. However when correlating with the prior study this was close to the original extravasation site. To decrease arterial pressure in the marginal branches, one 3 mm x 6 mm interlock coil was deployed within a peripheral right colic marginal branch.  Micro catheter was retracted. A second right colic branch was catheterized directed to the hepatic flexure. Angiogram of this area demonstrated collateral communication through marginal branches. From a peripheral right colic branch, active bleeding was still present.  The micro catheter was retracted and advanced into the active bleeding right colic branch over a Glidewire. Access was advanced peripheral within the right colic suspicious branch. Selective angiogram within this third right colic branch confirmed active bleeding within the hepatic flexure of the colon with contrast opacifying the colonic lumen.  From this peripheral location, one 2 mm x 3 mm Interlock coil and two 2 mm x 4 cm interlock coils were deployed in the peripheral right colic branch. Following this, several repeat post embolization angiograms were repeated over approximately 10 min. No further active bleeding was demonstrated.  Catheter removed. Hemostasis obtained with  a 6 [place] Starclose device. No immediate complication. Patient tolerated the procedure well.   IMPRESSION: Initial SMA angiogram confirms contrast extravasation from a peripheral right colic branch into the hepatic flexure of the colon compatible with arterial bleeding. This correlates with the nuclear medicine scan.  Peripheral micro catheter access was successful at accessing the peripheral SMA right colic branch to perform micro coil embolization. Post embolization, no further active bleeding could be demonstrated.   Electronically Signed   By: Daryll Brod M.D.   On: 04/29/2013 15:52   Dg Chest Port 1 View  04/27/2013   CLINICAL DATA A central line.  GI bleed both wall  EXAM PORTABLE CHEST - 1 VIEW  COMPARISON 10/13/2009  FINDINGS Examination is limited by rightward rotation. This is the best obtainable exam due to patient condition.  Right IJ catheter, tip at the mid to upper SVC. No pneumothorax or hemothorax. Cardiomegaly, comparison with prior limited due to above. Interstitial coarsening with interstitial crowding from hypoaeration.  IMPRESSION Right IJ catheter without adverse features.  SIGNATURE  Electronically Signed   By: Jorje Guild M.D.   On: 04/27/2013 15:56   [place] [facility] [employer]  04/29/2013   CLINICAL DATA:  Lower GI acute bleeding requiring blood transfusions, hypotensive, tachycardic, upper endoscopy negative, nuclear medicine bleeding scan positive for right colonic bleeding  EXAM: ULTRASOUND GUIDANCE VASCULAR ACCESS  ABDOMINAL FLUSH AORTOGRAM  SELECTIVE SMA ANGIOGRAM  SELECTIVE PERIPHERAL SMA RIGHT COLIC BRANCH MICRO CATHETERIZATION AND COIL EMBOLIZATION OF ACUTE ARTERIAL BLEED  Date:  3/12/20153/01/2014 12:19 AM  Radiologist:  Jerilynn Mages. Daryll Brod, MD  Guidance:  Ultrasound and fluoroscopic  FLUOROSCOPY TIME:  26 min 6 seconds  MEDICATIONS AND MEDICAL HISTORY: 2.5 mg Versed, 75 mcg fentanyl  ANESTHESIA/SEDATION: 110 min  CONTRAST:  126m OMNIPAQUE IOHEXOL 300 MG/ML  SOLN  COMPLICATIONS: No immediate  PROCEDURE: Informed consent was obtained  from the patient following explanation of the procedure, risks, benefits and alternatives. The patient understands, agrees and consents for the procedure. All questions were addressed. A time out was performed.  Maximal barrier sterile technique utilized including caps, mask, sterile gowns, sterile gloves, large sterile drape, hand hygiene, and Betadine.  Initially, under sterile conditions and local anesthesia, ultrasound right common femoral artery micropuncture access was performed. The micro guidewire would not advance centrally after 3 attempts. This was secondary to shelf-like plaque formation upon contrast injection. Access removed. Hemostasis obtained with manual compression. Attention directed to the left common femoral artery site. Under sterile conditions and local anesthesia, ultrasound micropuncture access performed of the left common femoral artery. Guidewire advanced easily. Five French sheath inserted over a Bentson guidewire. The pigtail catheter was inserted into the abdominal aorta. Flush aortogram performed.  Aortogram: Diffuse calcific atherosclerosis. Negative for aneurysm. Celiac, SMA, renal arteries and IMA all appear patent. On the delayed images, there is contrast extravasation in the right abdomen.  Pigtail catheter exchanged for a C2 catheter utilized to select the SMA origin. Selective SMA angiogram performed. There is active contrast extravasation compatible with arterial bleeding from a peripheral right colic branch at the level of the hepatic flexure.  A Renegade micro catheter and micro Glidewire were utilized to select peripheral right colic branches. Selective right colic angiograms performed throughout the hepatic flexure region of the colon. A peripheral right colic branch was eventually catheterize close to the area of active bleeding on previous angiograms. This area was interrogated extensively. 70 mcg nitroglycerin was also given to  provoke bleeding in the region. Despite  this, no definite active bleeding was demonstrated. However when correlating with the prior study this was close to the original extravasation site. To decrease arterial pressure in the marginal branches, one 3 mm x 6 mm interlock coil was deployed within a peripheral right colic marginal branch.  Micro catheter was retracted. A second right colic branch was catheterized directed to the hepatic flexure. Angiogram of this area demonstrated collateral communication through marginal branches. From a peripheral right colic branch, active bleeding was still present.  The micro catheter was retracted and advanced into the active bleeding right colic branch over a Glidewire. Access was advanced peripheral within the right colic suspicious branch. Selective angiogram within this third right colic branch confirmed active bleeding within the hepatic flexure of the colon with contrast opacifying the colonic lumen.  From this peripheral location, one 2 mm x 3 mm Interlock coil and two 2 mm x 4 cm interlock coils were deployed in the peripheral right colic branch. Following this, several repeat post embolization angiograms were repeated over approximately 10 min. No further active bleeding was demonstrated.  Catheter removed. Hemostasis obtained with a 6 [place] Starclose device. No immediate complication. Patient tolerated the procedure well.  IMPRESSION: Initial SMA angiogram confirms contrast extravasation from a peripheral right colic branch into the hepatic flexure of the colon compatible with arterial bleeding. This correlates with the nuclear medicine scan.  Peripheral micro catheter access was successful at accessing the peripheral SMA right colic branch to perform micro coil embolization. Post embolization, no further active bleeding could be demonstrated.   Electronically Signed   By: Daryll Brod M.D.   On: 04/29/2013 15:52   Ct Maxillofacial Wo Cm  04/25/2013   CLINICAL DATA:  Fall.  Bruising to left orbit.  EXAM:  CT HEAD WITHOUT CONTRAST  CT MAXILLOFACIAL WITHOUT CONTRAST  TECHNIQUE: Multidetector CT imaging of the head and maxillofacial structures were performed using the standard protocol without intravenous contrast. Multiplanar CT image reconstructions of the maxillofacial structures were also generated.  COMPARISON:  None.  FINDINGS: CT HEAD FINDINGS  Diffuse, extensive cerebral atrophy. Dilated extra-axial CSF spaces over the cerebral hemispheres bilaterally most likely related to the volume loss, or this could be related to chronic subdural hygromas. No hemorrhage. No mass lesion or acute infarction. No hydrocephalus.  No acute calvarial abnormality. Left orbital fractures noted. See maxillofacial report below.  CT MAXILLOFACIAL FINDINGS  Fracture through the left lateral orbital wall and orbital floor. Probable nondisplaced zygomatic arch fracture on the left. Fracture also noted posteriorly within the lateral posterior orbital wall on image 68 of series 6. This is also noted on image 71 of series 6. The fracture line continues laterally into the sphenoid wing and superiorly into the squamosal portion of the left temporal bone.  Small amount of fluid noted in the left maxillary sinus. Remainder the paranasal sinuses are clear. Mastoids are clear. Globes are intact.  IMPRESSION: Diffuse cerebral atrophy. Prominent extra-axial low-density fluid likely related to the atrophy although these could represent chronic subdural hygromas.  No acute intracranial abnormality.  Complex left orbital fracture including anterior and posterior lateral walls, extending laterally into the sphenoid wing, and likely into the squamosal portion of the temporal bone. Left orbital floor fracture. Nondisplaced zygomatic arch fracture.   Electronically Signed   By: Rolm Baptise M.D.   On: 04/25/2013 17:32    ASSESSMENT:  1. Acute GI bleed - s/p diverticular bleed with coil embolization. Hbg still  low at 8 after PRBC's 2. Hypovolemic shock  resolved and now off pressors with stable BP 3. Dyslipidemia  4. Prediabetes  5. Syncope with orbital fracture  6. Anemia secondary to #1  7. NSTEMI most likely type II secondary to demand ischemia from GI bleed and profound anemia +/- underlying CAD. 2D echo with normal LVF. Troponin peak fairly high but trending downward. I suspect he has underlying CAD given how high troponin peaked but given his age would treat medically at this time. I discussed options with his son who stated that patient just wants to be at home and would agree with medical therapy at this time. - beta blocker started and tolerating well - no ASA due to anemia and recent GI Bleed  8. Hypokalemia - repleted 9. Acute diastolic CHF - appears resolved  PLAN:  1. Severe anemia with in setting of NSTEMI - would recommend Transfuse to keep Hbg >9  3. Further cardiac workup on hold until GI bleed resolved.  At this time would opt for medical management given advanced age and comorbidities.    Sueanne Margarita, MD  05/01/2013  9:23 AM

## 2013-05-01 NOTE — Progress Notes (Signed)
Notified MD about 2nd bloody stool today.  Dark maroon looking. No clots noted.  Moderate amount.  Will obtain CBC and continue to monitor. Saunders Revel T

## 2013-05-01 NOTE — Progress Notes (Signed)
Stable from GI standpoint. Hgb 8.1. Will sign off.

## 2013-05-01 NOTE — Progress Notes (Signed)
[facility] PROGRESS NOTE  NICKY KJELLBERG [id number] DOB: 1922-04-05 DOA: 04/25/2013 PCP: Alesia Richards, MD  Assessment/Plan:  Hypovolemic shock.  -Patient having a GI bleed overnight, and continued this morning.  -Systolic blood pressures falling into the 70s for which he was transferred to the intensive care unit.  -Pulmonary critical care medicine involved, started on IV pressors. (Levophed off  3/13) -Will continue monitoring H&H, transfuse, IV fluid resuscitation.  -4 unit PRBC 04/27/2013 -3 Unit PRBC 04/28/2013  -1 unit PRBC 04/30/2013 -1 unit PRBC 05/01/2013 -3/13 Patient still does not have good by mouth intake, start LR at 75 ml/hr -3/14 will stop LR and start D5 1/4NS at 88ml/hr  Upper GI bleed  -Gastroenterology consulted  -Continue IV PPI therapy  -nuclear medicine bleeding scan; see results below -SMA embolization performed 04/28/2013 see results will -Continue Protonix to 40 mg BID  Acute hemorrhagic anemia  -Per cardiology maintain hemoglobin> 9 and is 6.2 overnight as he was typed and crossed and transfused with packed red blood cells  -Noted by nursing staff to have a red blood, possible this may represent upper GI bleed  -Continue monitoring H&H, transfuse as needed   Syncope  -Initial CT scan of brain negative  -Troponins were cycled, come back positive with peak troponin of 1.55 as cardiology was consulted.  -It is possible underlying anemia may have contributed to syncopal event. However currently patient becomes hypotensive when off Levophed  Hypotension - Resolved patient off of Levophed recent 3/13 -3/13 Lactated Ringer's at 75 ml/hr (will also help with patient's hypokalemia) -3/14 will stop LR and start D5 1/4NS at 79ml/hr  Elevated troponins/NSTEMI -Could be secondary to demand ischemia from anemia  -Continue blood transfusions (patient received 4 unit PRBC 3/10) -3/14 Cardiology following recommends transfuse patient to maintain  hemoglobin> 9 -Secondary to patient's hypotension unable to use beta blocker; unable use aspirin or heparin secondary to his GI bleed. -3/14 patient's hemoglobin<9, will transfuse one unit PRBC  Diastolic CHF -Mild; see elevated troponins  Hypokalemia -Potassium goal> 30meq/L -Replete and check in the a.m.  Hypomagnesemia -Magnesium goal> 2mg /dL -Replete and check in the a.m.  Hyperlipidemia  -Will speak with son concerning patient's listed in tolerance of statins   Prediabetes  - Obtain hemoglobin A1c, continue sliding-scale insulin   Nondisplaced Left periorbital fracture  - Evaluated by Dr. Benjamine Mola, does not feel patient needs any surgical intervention at this time and follow up outpatient one week after dc  Right hip pain -start tramadol PRN      Code Status: Full Family Communication: Son present Disposition Plan: Per GI   Consultants: Dr. Delfin Edis (GI) Dr. Merrie Roof (critical-care)   Procedures: IR EMBO ART VEN HEMORR [place] [id number] (Type: Custom)] 04/28/2013  Successful peripheral SMA rt colic branch 43mm and 85mm coil embolization for an acute lower GI bleed angiographically in the hepatic flexure  Nuclear medicine GI blood cell scan 04/28/2013 Abnormal GI bleeding exam demonstrating what appears to be small  bowel localization of tracer in the upper mid abdomen in right upper  quadrant, question duodenal versus proximal jejunal origin.  No definite colonic localization of labeled erythrocytes is  identified.   EGD by Dr. Olevia Perches 04/27/2013 Small reducible hiatal hernia  No blood or bleeding from upper GI tract  No lesions to account for GIB  Shortly following completion of the EGD, pt evacuated large volume of maroon stool  Central venous catheter insertion 04/27/2013   Echocardiogram 04/26/2013 Left ventricle: The  cavity size was normal. Wall thickness was normal.  -LVEF= 55% to 60%.  -(grade 1 diastolic  dysfunction). - Aortic valve: AV is thickened, calcified with minimally restricted motion. - Mitral valve: Mild regurgitation.     Antibiotics:    HPI/Subjective: Patient is an 80 yo gentleman  PMHx  hypertension, dyslipidemia, anemia, admitted to the medicine service on 04/25/2013. Family members that reported that patient was highly functional, able to perform all activities of daily living. It appears on the morning of admission he may have had a syncopal event that resulted in head injury. He was brought to the emergency department where CT scan showed a left paravertebral fracture. Was seen and evaluated by ENT, patient not felt to warrant surgical intervention. On the evening of 04/26/2013 he had 3 episodes of bloody stools. Repeat hemoglobin and hematocrit came back low at 6.2 and 17.7. He was typed and crossed and transfused with one unit of packed red blood cells. On the following morning he was administered a second unit of packed red blood cells. At 11:40 AM on 04/27/2013 his blood pressures have remained stable having blood pressure 120/90 then he was sat up with a rapid fall in his blood pressures to 71/49. Transfusion of packed red blood cells were continued however despite this his blood pressures remained low and could not undergo upper endoscopy. At this point he was transferred to the intensive care unit where he was administered blood and started on IV pressors. Patient's son present at bedside was updated on condition. 3/12 patient in much better spirits today. Answers questions appropriately requested no when he can be discharged. 3/13 patient sitting in chair, eating a small amount of his breakfast. Mentation greatly improved, but still borderline hypotensive off Levophed. Complains of right hip pain secondary to arthritis which is chronic 3/14 patient states right hip pain improved with the tramadol and is ready to start attempting to ambulate with physical therapies  help.   Objective: Filed Vitals:   05/01/13 0444 05/01/13 0500 05/01/13 0600 05/01/13 0700  BP:  117/47 118/46 121/44  Pulse:  68 50 60  Temp: 98.1 F (36.7 C)     TempSrc: Oral     Resp:  14 15 14   Height:      Weight:      SpO2:  93% 99% 100%    Intake/Output Summary (Last 24 hours) at 05/01/13 0807 Last data filed at 05/01/13 0600  Gross per 24 hour  Intake 905.53 ml  Output    495 ml  Net 410.53 ml   Filed Weights   04/26/13 0400 04/26/13 2355 04/27/13 0418  Weight: 76.7 kg (169 lb 1.5 oz) 79.153 kg (174 lb 8 oz) 79.334 kg (174 lb 14.4 oz)    Exam:   General: A./O. x4, NAD,   Cardiovascular: Regular rhythm and rate, negative murmurs rubs or gallops  Respiratory: Clear to auscultation bilateral    Abdomen: Soft, nontender, nondistended, normal bowel sounds  Musculoskeletal: Negative pedal edema, , bruise under left eye consistent with fall/fracture slowly improving.   Data Reviewed: Basic Metabolic Panel:  Recent Labs Lab 04/28/13 0157 04/29/13 0456 04/29/13 1355 04/29/13 2221 04/30/13 0500 05/01/13 0500  NA 146 142  --  139 140 140  K 4.6 3.4* 3.3* 3.6* 3.5* 4.7  CL 115* 112  --  108 107 110  CO2 16* 14*  --  20 22 21   GLUCOSE 159* 174*  --  182* 179* 137*  BUN 62* 48*  --  28* 24* 25*  CREATININE 1.00 0.99  --  0.91 0.85 0.83  CALCIUM 7.0* 7.5*  --  7.1* 7.2* 7.4*  MG  --  1.7  --   --  2.1 2.3   Liver Function Tests:  Recent Labs Lab 04/26/13 0125 04/29/13 0456 04/30/13 0500 05/01/13 0500  AST 18 71* 40* 31  ALT 10 30 27 25   ALKPHOS 47 35* 33* 35*  BILITOT 0.3 0.4 0.5 0.4  PROT 5.1* 4.1* 4.0* 4.2*  ALBUMIN 2.7* 2.3* 2.0* 1.9*   No results found for this basename: LIPASE, AMYLASE,  in the last 168 hours No results found for this basename: AMMONIA,  in the last 168 hours CBC:  Recent Labs Lab 04/25/13 1823 04/26/13 0125  04/29/13 0456 04/29/13 0730 04/29/13 1600 04/29/13 2334 04/30/13 0500 05/01/13 0500  WBC 10.1 7.0  < >  11.0* 10.4 10.2 10.3 10.4 7.8  NEUTROABS 9.2* 6.1  --  8.7*  --   --   --  8.7* 6.7  HGB 10.0* 8.4*  < > 9.8* 9.2* 7.8* 7.4* 7.7* 8.1*  HCT 28.8* 24.0*  < > 27.1* 25.2* 21.4* 20.3* 21.3* 22.8*  MCV 99.0 98.8  < > 88.3 88.4 88.1 88.3 88.4 89.1  PLT 178 155  < > 108* 105* 84* 87* 89* 73*  < > = values in this interval not displayed. Cardiac Enzymes:  Recent Labs Lab 04/26/13 0125 04/26/13 0900 04/26/13 1301 04/26/13 2150 04/28/13 1046 04/29/13 0030 04/29/13 0730  CKTOTAL  --  504* 619* 853*  --   --   --   CKMB  --  16.4* 17.3*  --   --   --   --   TROPONINI 0.64* 1.55* 1.54* 2.79* 15.27* 19.26* 14.25*   BNP (last 3 results)  Recent Labs  04/29/13 0730  PROBNP 4803.0*   CBG:  Recent Labs Lab 04/28/13 1818 04/28/13 2027 04/29/13 0105 04/29/13 0346 04/29/13 0810  GLUCAP 166* 155* 173* 173* 135*    Recent Results (from the past 240 hour(s))  MRSA PCR SCREENING     Status: None   Collection Time    04/27/13  2:38 PM      Result Value Ref Range Status   MRSA by PCR NEGATIVE  NEGATIVE Final   Comment:            The GeneXpert MRSA Assay (FDA     approved for NASAL specimens     only), is one component of a     comprehensive MRSA colonization     surveillance program. It is not     intended to diagnose MRSA     infection nor to guide or     monitor treatment for     MRSA infections.     Studies: No results found.  Scheduled Meds: . cholecalciferol  400 Units Oral Daily  . feeding supplement (RESOURCE BREEZE)  1 Container Oral BID BM  . metoprolol tartrate  12.5 mg Oral BID  . pantoprazole  40 mg Oral Daily  . sodium chloride  500 mL Intravenous Once   Continuous Infusions: . sodium chloride Stopped (04/30/13 2000)  . dextrose 5 % and 0.2 % NaCl      Principal Problem:   Upper GI bleed Active Problems:   Hyperlipidemia   Hypertension   Prediabetes   Syncope   Orbital fracture   Anemia   Hemorrhage of gastrointestinal tract, unspecified    Hemorrhagic shock   NSTEMI (non-ST elevated myocardial infarction)  Nonspecific (abnormal) findings on radiological and other examination of gastrointestinal tract   Diverticular hemorrhage   Diastolic CHF   Hypokalemia   Hypomagnesemia   Diverticulitis of colon (without mention of hemorrhage)   Hip pain, right    Time spent: 35 minute   WOODS, Deaver, J  [facility] Pager [phone]. If 7PM-7AM, please contact night-coverage at [email or web address], password [facility] 05/01/2013, 8:07 AM  LOS: 6 days

## 2013-05-02 LAB — TYPE AND SCREEN
ABO/RH(D): B NEG
Antibody Screen: NEGATIVE
UNIT DIVISION: 0
Unit division: 0

## 2013-05-02 LAB — COMPREHENSIVE METABOLIC PANEL
ALT: 26 U/L (ref 0–53)
AST: 27 U/L (ref 0–37)
Albumin: 1.9 g/dL — ABNORMAL LOW (ref 3.5–5.2)
Alkaline Phosphatase: 51 U/L (ref 39–117)
BUN: 20 mg/dL (ref 6–23)
CALCIUM: 7.5 mg/dL — AB (ref 8.4–10.5)
CHLORIDE: 104 meq/L (ref 96–112)
CO2: 21 mEq/L (ref 19–32)
Creatinine, Ser: 0.7 mg/dL (ref 0.50–1.35)
GFR calc Af Amer: >90 mL/min (ref >90)
GFR calc non Af Amer: 82 mL/min — ABNORMAL LOW (ref >90)
Glucose, Bld: 166 mg/dL — ABNORMAL HIGH (ref 70–99)
Potassium: 4.3 mEq/L (ref 3.7–5.3)
SODIUM: 136 meq/L — AB (ref 137–147)
Total Bilirubin: 0.4 mg/dL (ref 0.3–1.2)
Total Protein: 4.5 g/dL — ABNORMAL LOW (ref 6.0–8.3)

## 2013-05-02 LAB — CBC WITH DIFFERENTIAL/PLATELET
Basophils Absolute: 0 10*3/uL (ref 0.0–0.1)
Basophils Relative: 0 % (ref 0–1)
EOS ABS: 0.1 10*3/uL (ref 0.0–0.7)
EOS PCT: 1 % (ref 0–5)
HCT: 28.4 % — ABNORMAL LOW (ref 39.0–52.0)
HEMOGLOBIN: 9.8 g/dL — AB (ref 13.0–17.0)
Lymphocytes Relative: 6 % — ABNORMAL LOW (ref 12–46)
Lymphs Abs: 0.6 10*3/uL — ABNORMAL LOW (ref 0.7–4.0)
MCH: 30.9 pg (ref 26.0–34.0)
MCHC: 34.5 g/dL (ref 30.0–36.0)
MCV: 89.6 fL (ref 78.0–100.0)
MONOS PCT: 5 % (ref 3–12)
Monocytes Absolute: 0.5 10*3/uL (ref 0.1–1.0)
Neutro Abs: 8.9 10*3/uL — ABNORMAL HIGH (ref 1.7–7.7)
Neutrophils Relative %: 88 % — ABNORMAL HIGH (ref 43–77)
Platelets: 94 10*3/uL — ABNORMAL LOW (ref 150–400)
RBC: 3.17 MIL/uL — ABNORMAL LOW (ref 4.22–5.81)
RDW: 17.8 % — ABNORMAL HIGH (ref 11.5–15.5)
WBC: 10.1 10*3/uL (ref 4.0–10.5)

## 2013-05-02 LAB — MAGNESIUM: Magnesium: 2 mg/dL (ref 1.5–2.5)

## 2013-05-02 MED ORDER — BIOTENE DRY MOUTH MT LIQD
15.0000 mL | Freq: Two times a day (BID) | OROMUCOSAL | Status: DC
  Administered 2013-05-02 – 2013-05-05 (×7): 15 mL via OROMUCOSAL

## 2013-05-02 MED ORDER — FUROSEMIDE 10 MG/ML IJ SOLN
INTRAMUSCULAR | Status: AC
  Filled 2013-05-02: qty 4

## 2013-05-02 MED ORDER — FUROSEMIDE 10 MG/ML IJ SOLN
20.0000 mg | Freq: Once | INTRAMUSCULAR | Status: AC
  Administered 2013-05-02: 20 mg via INTRAVENOUS

## 2013-05-02 MED ORDER — ALBUTEROL SULFATE (2.5 MG/3ML) 0.083% IN NEBU
2.5000 mg | INHALATION_SOLUTION | Freq: Four times a day (QID) | RESPIRATORY_TRACT | Status: DC | PRN
  Administered 2013-05-04: 2.5 mg via RESPIRATORY_TRACT
  Filled 2013-05-02: qty 3

## 2013-05-02 NOTE — Clinical Social Work Note (Signed)
FL2 on chart for MD signature.  Bryant Antoniette Peake, LCSWA, LCASA, [phone] 

## 2013-05-02 NOTE — Progress Notes (Signed)
SUBJECTIVE:  No complaints  OBJECTIVE:   Vitals:   Filed Vitals:   05/02/13 0333 05/02/13 0439 05/02/13 0740 05/02/13 0800  BP: 128/73 159/68 122/88   Pulse:      Temp:  97.6 F (36.4 C) 97.5 F (36.4 C)   TempSrc:  Oral Oral   Resp: 20 25  31   Height:      Weight: 192 lb 7.4 oz (87.3 kg)     SpO2: 94% 97% 94% 91%   I&O's:   Intake/Output Summary (Last 24 hours) at 05/02/13 7062 Last data filed at 05/02/13 0800  Gross per 24 hour  Intake 2663.75 ml  Output   1451 ml  Net 1212.75 ml   TELEMETRY: Reviewed telemetry pt in NSR with PAC's:     PHYSICAL EXAM General: Well developed, well nourished, in no acute distress Head: Eyes PERRLA, No xanthomas.   Normal cephalic and atramatic  Lungs:   Clear bilaterally to auscultation and percussion. Heart:   HRRR S1 S2 Pulses are 2+ & equal. Abdomen: Bowel sounds are positive, abdomen soft and non-tender without masses  Extremities:   No clubbing, cyanosis or edema.  DP +1 Neuro: Alert and oriented X 3. Psych:  Good affect, responds appropriately   LABS: Basic Metabolic Panel:  Recent Labs  05/01/13 0500 05/02/13 0340  NA 140 136*  K 4.7 4.3  CL 110 104  CO2 21 21  GLUCOSE 137* 166*  BUN 25* 20  CREATININE 0.83 0.70  CALCIUM 7.4* 7.5*  MG 2.3 2.0   Liver Function Tests:  Recent Labs  05/01/13 0500 05/02/13 0340  AST 31 27  ALT 25 26  ALKPHOS 35* 51  BILITOT 0.4 0.4  PROT 4.2* 4.5*  ALBUMIN 1.9* 1.9*   No results found for this basename: LIPASE, AMYLASE,  in the last 72 hours CBC:  Recent Labs  05/01/13 0500 05/01/13 2222 05/02/13 0340  WBC 7.8 10.3 10.1  NEUTROABS 6.7  --  8.9*  HGB 8.1* 10.0* 9.8*  HCT 22.8* 28.7* 28.4*  MCV 89.1 88.9 89.6  PLT 73* 91* 94*   Cardiac Enzymes: No results found for this basename: CKTOTAL, CKMB, CKMBINDEX, TROPONINI,  in the last 72 hours BNP: No components found with this basename: POCBNP,  D-Dimer: No results found for this basename: DDIMER,  in the last  72 hours Hemoglobin A1C: No results found for this basename: HGBA1C,  in the last 72 hours Fasting Lipid Panel: No results found for this basename: CHOL, HDL, LDLCALC, TRIG, CHOLHDL, LDLDIRECT,  in the last 72 hours Thyroid Function Tests: No results found for this basename: TSH, T4TOTAL, FREET3, T3FREE, THYROIDAB,  in the last 72 hours Anemia Panel: No results found for this basename: VITAMINB12, FOLATE, FERRITIN, TIBC, IRON, RETICCTPCT,  in the last 72 hours Coag Panel:   Lab Results  Component Value Date   INR 1.24 04/27/2013    RADIOLOGY: Ct Head Wo Contrast  04/25/2013   CLINICAL DATA:  Fall.  Bruising to left orbit.  EXAM: CT HEAD WITHOUT CONTRAST  CT MAXILLOFACIAL WITHOUT CONTRAST  TECHNIQUE: Multidetector CT imaging of the head and maxillofacial structures were performed using the standard protocol without intravenous contrast. Multiplanar CT image reconstructions of the maxillofacial structures were also generated.  COMPARISON:  None.  FINDINGS: CT HEAD FINDINGS  Diffuse, extensive cerebral atrophy. Dilated extra-axial CSF spaces over the cerebral hemispheres bilaterally most likely related to the volume loss, or this could be related to chronic subdural hygromas. No hemorrhage. No mass lesion  or acute infarction. No hydrocephalus.  No acute calvarial abnormality. Left orbital fractures noted. See maxillofacial report below.  CT MAXILLOFACIAL FINDINGS  Fracture through the left lateral orbital wall and orbital floor. Probable nondisplaced zygomatic arch fracture on the left. Fracture also noted posteriorly within the lateral posterior orbital wall on image 68 of series 6. This is also noted on image 71 of series 6. The fracture line continues laterally into the sphenoid wing and superiorly into the squamosal portion of the left temporal bone.  Small amount of fluid noted in the left maxillary sinus. Remainder the paranasal sinuses are clear. Mastoids are clear. Globes are intact.  IMPRESSION:  Diffuse cerebral atrophy. Prominent extra-axial low-density fluid likely related to the atrophy although these could represent chronic subdural hygromas.  No acute intracranial abnormality.  Complex left orbital fracture including anterior and posterior lateral walls, extending laterally into the sphenoid wing, and likely into the squamosal portion of the temporal bone. Left orbital floor fracture. Nondisplaced zygomatic arch fracture.   Electronically Signed   By: Rolm Baptise M.D.   On: 04/25/2013 17:32   Nm Gi Blood Loss  04/28/2013   CLINICAL DATA Hematochezia  EXAM NUCLEAR MEDICINE GASTROINTESTINAL BLEEDING SCAN  TECHNIQUE Sequential abdominal images were obtained following intravenous administration of Tc-34mlabeled red blood cells.  RADIOPHARMACEUTICALS 25 MILLI CURIE ULTRATAG TECHNETIUM TC 4M-LABELED RED BLOOD CELLS IV KIT  COMPARISON None  FINDINGS Abnormal red cell localization is identified in the right upper quadrant beginning at 31 min.  This abnormal tracer continues through multiple loops of bowel in the mid abdomen and right upper quadrant.  This probably represents a duodenal or proximal jejunal bleeding source.  No tracer is seen along the expected course of the colon.  IMPRESSION Abnormal GI bleeding exam demonstrating what appears to be small bowel localization of tracer in the upper mid abdomen in right upper quadrant, question duodenal versus proximal jejunal origin.  No definite colonic localization of labeled erythrocytes is identified.  SIGNATURE  Electronically Signed   By: MLavonia DanaM.D.   On: 04/28/2013 13:14   Ir Angiogram Visceral Selective  04/29/2013   CLINICAL DATA:  Lower GI acute bleeding requiring blood transfusions, hypotensive, tachycardic, upper endoscopy negative, nuclear medicine bleeding scan positive for right colonic bleeding  EXAM: ULTRASOUND GUIDANCE VASCULAR ACCESS  ABDOMINAL FLUSH AORTOGRAM  SELECTIVE SMA ANGIOGRAM  SELECTIVE PERIPHERAL SMA RIGHT COLIC BRANCH  MICRO CATHETERIZATION AND COIL EMBOLIZATION OF ACUTE ARTERIAL BLEED  Date:  3/12/20153/01/2014 12:19 AM  Radiologist:  MJerilynn Mages TDaryll Brod MD  Guidance:  Ultrasound and fluoroscopic  FLUOROSCOPY TIME:  26 min 6 seconds  MEDICATIONS AND MEDICAL HISTORY: 2.5 mg Versed, 75 mcg fentanyl  ANESTHESIA/SEDATION: 110 min  CONTRAST:  1131mOMNIPAQUE IOHEXOL 300 MG/ML  SOLN  COMPLICATIONS: No immediate  PROCEDURE: Informed consent was obtained from the patient following explanation of the procedure, risks, benefits and alternatives. The patient understands, agrees and consents for the procedure. All questions were addressed. A time out was performed.  Maximal barrier sterile technique utilized including caps, mask, sterile gowns, sterile gloves, large sterile drape, hand hygiene, and Betadine.  Initially, under sterile conditions and local anesthesia, ultrasound right common femoral artery micropuncture access was performed. The micro guidewire would not advance centrally after 3 attempts. This was secondary to shelf-like plaque formation upon contrast injection. Access removed. Hemostasis obtained with manual compression. Attention directed to the left common femoral artery site. Under sterile conditions and local anesthesia, ultrasound micropuncture access performed of the left  common femoral artery. Guidewire advanced easily. Five French sheath inserted over a Bentson guidewire. The pigtail catheter was inserted into the abdominal aorta. Flush aortogram performed.  Aortogram: Diffuse calcific atherosclerosis. Negative for aneurysm. Celiac, SMA, renal arteries and IMA all appear patent. On the delayed images, there is contrast extravasation in the right abdomen.  Pigtail catheter exchanged for a C2 catheter utilized to select the SMA origin. Selective SMA angiogram performed. There is active contrast extravasation compatible with arterial bleeding from a peripheral right colic branch at the level of the hepatic flexure.  A  Renegade micro catheter and micro Glidewire were utilized to select peripheral right colic branches. Selective right colic angiograms performed throughout the hepatic flexure region of the colon. A peripheral right colic branch was eventually catheterize close to the area of active bleeding on previous angiograms. This area was interrogated extensively. 70 mcg nitroglycerin was also given to provoke bleeding in the region. Despite this, no definite active bleeding was demonstrated. However when correlating with the prior study this was close to the original extravasation site. To decrease arterial pressure in the marginal branches, one 3 mm x 6 mm interlock coil was deployed within a peripheral right colic marginal branch.  Micro catheter was retracted. A second right colic branch was catheterized directed to the hepatic flexure. Angiogram of this area demonstrated collateral communication through marginal branches. From a peripheral right colic branch, active bleeding was still present.  The micro catheter was retracted and advanced into the active bleeding right colic branch over a Glidewire. Access was advanced peripheral within the right colic suspicious branch. Selective angiogram within this third right colic branch confirmed active bleeding within the hepatic flexure of the colon with contrast opacifying the colonic lumen.  From this peripheral location, one 2 mm x 3 mm Interlock coil and two 2 mm x 4 cm interlock coils were deployed in the peripheral right colic branch. Following this, several repeat post embolization angiograms were repeated over approximately 10 min. No further active bleeding was demonstrated.  Catheter removed. Hemostasis obtained with a 6 [place] Starclose device. No immediate complication. Patient tolerated the procedure well.  IMPRESSION: Initial SMA angiogram confirms contrast extravasation from a peripheral right colic branch into the hepatic flexure of the colon compatible with  arterial bleeding. This correlates with the nuclear medicine scan.  Peripheral micro catheter access was successful at accessing the peripheral SMA right colic branch to perform micro coil embolization. Post embolization, no further active bleeding could be demonstrated.   Electronically Signed   By: Daryll Brod M.D.   On: 04/29/2013 15:52   Ir Angiogram Selective Each Additional Vessel  04/29/2013   CLINICAL DATA:  Lower GI acute bleeding requiring blood transfusions, hypotensive, tachycardic, upper endoscopy negative, nuclear medicine bleeding scan positive for right colonic bleeding  EXAM: ULTRASOUND GUIDANCE VASCULAR ACCESS  ABDOMINAL FLUSH AORTOGRAM  SELECTIVE SMA ANGIOGRAM  SELECTIVE PERIPHERAL SMA RIGHT COLIC BRANCH MICRO CATHETERIZATION AND COIL EMBOLIZATION OF ACUTE ARTERIAL BLEED  Date:  3/12/20153/01/2014 12:19 AM  Radiologist:  Jerilynn Mages. Daryll Brod, MD  Guidance:  Ultrasound and fluoroscopic  FLUOROSCOPY TIME:  26 min 6 seconds  MEDICATIONS AND MEDICAL HISTORY: 2.5 mg Versed, 75 mcg fentanyl  ANESTHESIA/SEDATION: 110 min  CONTRAST:  111m OMNIPAQUE IOHEXOL 300 MG/ML  SOLN  COMPLICATIONS: No immediate  PROCEDURE: Informed consent was obtained from the patient following explanation of the procedure, risks, benefits and alternatives. The patient understands, agrees and consents for the procedure. All questions were addressed. A time out  was performed.  Maximal barrier sterile technique utilized including caps, mask, sterile gowns, sterile gloves, large sterile drape, hand hygiene, and Betadine.  Initially, under sterile conditions and local anesthesia, ultrasound right common femoral artery micropuncture access was performed. The micro guidewire would not advance centrally after 3 attempts. This was secondary to shelf-like plaque formation upon contrast injection. Access removed. Hemostasis obtained with manual compression. Attention directed to the left common femoral artery site. Under sterile conditions  and local anesthesia, ultrasound micropuncture access performed of the left common femoral artery. Guidewire advanced easily. Five French sheath inserted over a Bentson guidewire. The pigtail catheter was inserted into the abdominal aorta. Flush aortogram performed.  Aortogram: Diffuse calcific atherosclerosis. Negative for aneurysm. Celiac, SMA, renal arteries and IMA all appear patent. On the delayed images, there is contrast extravasation in the right abdomen.  Pigtail catheter exchanged for a C2 catheter utilized to select the SMA origin. Selective SMA angiogram performed. There is active contrast extravasation compatible with arterial bleeding from a peripheral right colic branch at the level of the hepatic flexure.  A Renegade micro catheter and micro Glidewire were utilized to select peripheral right colic branches. Selective right colic angiograms performed throughout the hepatic flexure region of the colon. A peripheral right colic branch was eventually catheterize close to the area of active bleeding on previous angiograms. This area was interrogated extensively. 70 mcg nitroglycerin was also given to provoke bleeding in the region. Despite this, no definite active bleeding was demonstrated. However when correlating with the prior study this was close to the original extravasation site. To decrease arterial pressure in the marginal branches, one 3 mm x 6 mm interlock coil was deployed within a peripheral right colic marginal branch.  Micro catheter was retracted. A second right colic branch was catheterized directed to the hepatic flexure. Angiogram of this area demonstrated collateral communication through marginal branches. From a peripheral right colic branch, active bleeding was still present.  The micro catheter was retracted and advanced into the active bleeding right colic branch over a Glidewire. Access was advanced peripheral within the right colic suspicious branch. Selective angiogram within  this third right colic branch confirmed active bleeding within the hepatic flexure of the colon with contrast opacifying the colonic lumen.  From this peripheral location, one 2 mm x 3 mm Interlock coil and two 2 mm x 4 cm interlock coils were deployed in the peripheral right colic branch. Following this, several repeat post embolization angiograms were repeated over approximately 10 min. No further active bleeding was demonstrated.  Catheter removed. Hemostasis obtained with a 6 [place] Starclose device. No immediate complication. Patient tolerated the procedure well.  IMPRESSION: Initial SMA angiogram confirms contrast extravasation from a peripheral right colic branch into the hepatic flexure of the colon compatible with arterial bleeding. This correlates with the nuclear medicine scan.  Peripheral micro catheter access was successful at accessing the peripheral SMA right colic branch to perform micro coil embolization. Post embolization, no further active bleeding could be demonstrated.   Electronically Signed   By: Daryll Brod M.D.   On: 04/29/2013 15:52   Ir Angiogram Follow Up Study  04/29/2013   CLINICAL DATA:  Lower GI acute bleeding requiring blood transfusions, hypotensive, tachycardic, upper endoscopy negative, nuclear medicine bleeding scan positive for right colonic bleeding  EXAM: ULTRASOUND GUIDANCE VASCULAR ACCESS  ABDOMINAL FLUSH AORTOGRAM  SELECTIVE SMA ANGIOGRAM  SELECTIVE PERIPHERAL SMA RIGHT COLIC BRANCH MICRO CATHETERIZATION AND COIL EMBOLIZATION OF ACUTE ARTERIAL BLEED  Date:  3/12/20153/01/2014 12:19 AM  Radiologist:  Jerilynn Mages. Daryll Brod, MD  Guidance:  Ultrasound and fluoroscopic  FLUOROSCOPY TIME:  26 min 6 seconds  MEDICATIONS AND MEDICAL HISTORY: 2.5 mg Versed, 75 mcg fentanyl  ANESTHESIA/SEDATION: 110 min  CONTRAST:  155m OMNIPAQUE IOHEXOL 300 MG/ML  SOLN  COMPLICATIONS: No immediate  PROCEDURE: Informed consent was obtained from the patient following explanation of the procedure,  risks, benefits and alternatives. The patient understands, agrees and consents for the procedure. All questions were addressed. A time out was performed.  Maximal barrier sterile technique utilized including caps, mask, sterile gowns, sterile gloves, large sterile drape, hand hygiene, and Betadine.  Initially, under sterile conditions and local anesthesia, ultrasound right common femoral artery micropuncture access was performed. The micro guidewire would not advance centrally after 3 attempts. This was secondary to shelf-like plaque formation upon contrast injection. Access removed. Hemostasis obtained with manual compression. Attention directed to the left common femoral artery site. Under sterile conditions and local anesthesia, ultrasound micropuncture access performed of the left common femoral artery. Guidewire advanced easily. Five French sheath inserted over a Bentson guidewire. The pigtail catheter was inserted into the abdominal aorta. Flush aortogram performed.  Aortogram: Diffuse calcific atherosclerosis. Negative for aneurysm. Celiac, SMA, renal arteries and IMA all appear patent. On the delayed images, there is contrast extravasation in the right abdomen.  Pigtail catheter exchanged for a C2 catheter utilized to select the SMA origin. Selective SMA angiogram performed. There is active contrast extravasation compatible with arterial bleeding from a peripheral right colic branch at the level of the hepatic flexure.  A Renegade micro catheter and micro Glidewire were utilized to select peripheral right colic branches. Selective right colic angiograms performed throughout the hepatic flexure region of the colon. A peripheral right colic branch was eventually catheterize close to the area of active bleeding on previous angiograms. This area was interrogated extensively. 70 mcg nitroglycerin was also given to provoke bleeding in the region. Despite this, no definite active bleeding was demonstrated. However  when correlating with the prior study this was close to the original extravasation site. To decrease arterial pressure in the marginal branches, one 3 mm x 6 mm interlock coil was deployed within a peripheral right colic marginal branch.  Micro catheter was retracted. A second right colic branch was catheterized directed to the hepatic flexure. Angiogram of this area demonstrated collateral communication through marginal branches. From a peripheral right colic branch, active bleeding was still present.  The micro catheter was retracted and advanced into the active bleeding right colic branch over a Glidewire. Access was advanced peripheral within the right colic suspicious branch. Selective angiogram within this third right colic branch confirmed active bleeding within the hepatic flexure of the colon with contrast opacifying the colonic lumen.  From this peripheral location, one 2 mm x 3 mm Interlock coil and two 2 mm x 4 cm interlock coils were deployed in the peripheral right colic branch. Following this, several repeat post embolization angiograms were repeated over approximately 10 min. No further active bleeding was demonstrated.  Catheter removed. Hemostasis obtained with a 6 [place] device. No immediate complication. Patient tolerated the procedure well.  IMPRESSION: Initial SMA angiogram confirms contrast extravasation from a peripheral right colic branch into the hepatic flexure of the colon compatible with arterial bleeding. This correlates with the nuclear medicine scan.  Peripheral micro catheter access was successful at accessing the peripheral SMA right colic branch to perform micro coil embolization. Post embolization, no further active bleeding  could be demonstrated.   Electronically Signed   By: Daryll Brod M.D.   On: 04/29/2013 15:52   Ir US Guide Vasc Access Left  04/29/2013   CLINICAL DATA:  Lower GI acute bleeding requiring blood transfusions, hypotensive, tachycardic, upper  endoscopy negative, nuclear medicine bleeding scan positive for right colonic bleeding  EXAM: ULTRASOUND GUIDANCE VASCULAR ACCESS  ABDOMINAL FLUSH AORTOGRAM  SELECTIVE SMA ANGIOGRAM  SELECTIVE PERIPHERAL SMA RIGHT COLIC BRANCH MICRO CATHETERIZATION AND COIL EMBOLIZATION OF ACUTE ARTERIAL BLEED  Date:  3/12/20153/01/2014 12:19 AM  Radiologist:  Jerilynn Mages. Daryll Brod, MD  Guidance:  Ultrasound and fluoroscopic  FLUOROSCOPY TIME:  26 min 6 seconds  MEDICATIONS AND MEDICAL HISTORY: 2.5 mg Versed, 75 mcg fentanyl  ANESTHESIA/SEDATION: 110 min  CONTRAST:  134m OMNIPAQUE IOHEXOL 300 MG/ML  SOLN  COMPLICATIONS: No immediate  PROCEDURE: Informed consent was obtained from the patient following explanation of the procedure, risks, benefits and alternatives. The patient understands, agrees and consents for the procedure. All questions were addressed. A time out was performed.  Maximal barrier sterile technique utilized including caps, mask, sterile gowns, sterile gloves, large sterile drape, hand hygiene, and Betadine.  Initially, under sterile conditions and local anesthesia, ultrasound right common femoral artery micropuncture access was performed. The micro guidewire would not advance centrally after 3 attempts. This was secondary to shelf-like plaque formation upon contrast injection. Access removed. Hemostasis obtained with manual compression. Attention directed to the left common femoral artery site. Under sterile conditions and local anesthesia, ultrasound micropuncture access performed of the left common femoral artery. Guidewire advanced easily. Five French sheath inserted over a Bentson guidewire. The pigtail catheter was inserted into the abdominal aorta. Flush aortogram performed.  Aortogram: Diffuse calcific atherosclerosis. Negative for aneurysm. Celiac, SMA, renal arteries and IMA all appear patent. On the delayed images, there is contrast extravasation in the right abdomen.  Pigtail catheter exchanged for a C2  catheter utilized to select the SMA origin. Selective SMA angiogram performed. There is active contrast extravasation compatible with arterial bleeding from a peripheral right colic branch at the level of the hepatic flexure.  A Renegade micro catheter and micro Glidewire were utilized to select peripheral right colic branches. Selective right colic angiograms performed throughout the hepatic flexure region of the colon. A peripheral right colic branch was eventually catheterize close to the area of active bleeding on previous angiograms. This area was interrogated extensively. 70 mcg nitroglycerin was also given to provoke bleeding in the region. Despite this, no definite active bleeding was demonstrated. However when correlating with the prior study this was close to the original extravasation site. To decrease arterial pressure in the marginal branches, one 3 mm x 6 mm interlock coil was deployed within a peripheral right colic marginal branch.  Micro catheter was retracted. A second right colic branch was catheterized directed to the hepatic flexure. Angiogram of this area demonstrated collateral communication through marginal branches. From a peripheral right colic branch, active bleeding was still present.  The micro catheter was retracted and advanced into the active bleeding right colic branch over a Glidewire. Access was advanced peripheral within the right colic suspicious branch. Selective angiogram within this third right colic branch confirmed active bleeding within the hepatic flexure of the colon with contrast opacifying the colonic lumen.  From this peripheral location, one 2 mm x 3 mm Interlock coil and two 2 mm x 4 cm interlock coils were deployed in the peripheral right colic branch. Following this, several repeat post embolization angiograms were  repeated over approximately 10 min. No further active bleeding was demonstrated.  Catheter removed. Hemostasis obtained with a 6 [place] Starclose  device. No immediate complication. Patient tolerated the procedure well.  IMPRESSION: Initial SMA angiogram confirms contrast extravasation from a peripheral right colic branch into the hepatic flexure of the colon compatible with arterial bleeding. This correlates with the nuclear medicine scan.  Peripheral micro catheter access was successful at accessing the peripheral SMA right colic branch to perform micro coil embolization. Post embolization, no further active bleeding could be demonstrated.   Electronically Signed   By: Daryll Brod M.D.   On: 04/29/2013 15:52   Dg Chest Port 1 View  04/27/2013   CLINICAL DATA A central line.  GI bleed both wall  EXAM PORTABLE CHEST - 1 VIEW  COMPARISON 10/13/2009  FINDINGS Examination is limited by rightward rotation. This is the best obtainable exam due to patient condition.  Right IJ catheter, tip at the mid to upper SVC. No pneumothorax or hemothorax. Cardiomegaly, comparison with prior limited due to above. Interstitial coarsening with interstitial crowding from hypoaeration.  IMPRESSION Right IJ catheter without adverse features.  SIGNATURE  Electronically Signed   By: Jorje Guild M.D.   On: 04/27/2013 15:56   [place] Guide Roadmapping  04/29/2013   CLINICAL DATA:  Lower GI acute bleeding requiring blood transfusions, hypotensive, tachycardic, upper endoscopy negative, nuclear medicine bleeding scan positive for right colonic bleeding  EXAM: ULTRASOUND GUIDANCE VASCULAR ACCESS  ABDOMINAL FLUSH AORTOGRAM  SELECTIVE SMA ANGIOGRAM  SELECTIVE PERIPHERAL SMA RIGHT COLIC BRANCH MICRO CATHETERIZATION AND COIL EMBOLIZATION OF ACUTE ARTERIAL BLEED  Date:  3/12/20153/01/2014 12:19 AM  Radiologist:  Jerilynn Mages. Daryll Brod, MD  Guidance:  Ultrasound and fluoroscopic  FLUOROSCOPY TIME:  26 min 6 seconds  MEDICATIONS AND MEDICAL HISTORY: 2.5 mg Versed, 75 mcg fentanyl  ANESTHESIA/SEDATION: 110 min  CONTRAST:  174m OMNIPAQUE IOHEXOL 300 MG/ML   SOLN  COMPLICATIONS: No immediate  PROCEDURE: Informed consent was obtained from the patient following explanation of the procedure, risks, benefits and alternatives. The patient understands, agrees and consents for the procedure. All questions were addressed. A time out was performed.  Maximal barrier sterile technique utilized including caps, mask, sterile gowns, sterile gloves, large sterile drape, hand hygiene, and Betadine.  Initially, under sterile conditions and local anesthesia, ultrasound right common femoral artery micropuncture access was performed. The micro guidewire would not advance centrally after 3 attempts. This was secondary to shelf-like plaque formation upon contrast injection. Access removed. Hemostasis obtained with manual compression. Attention directed to the left common femoral artery site. Under sterile conditions and local anesthesia, ultrasound micropuncture access performed of the left common femoral artery. Guidewire advanced easily. Five French sheath inserted over a Bentson guidewire. The pigtail catheter was inserted into the abdominal aorta. Flush aortogram performed.  Aortogram: Diffuse calcific atherosclerosis. Negative for aneurysm. Celiac, SMA, renal arteries and IMA all appear patent. On the delayed images, there is contrast extravasation in the right abdomen.  Pigtail catheter exchanged for a C2 catheter utilized to select the SMA origin. Selective SMA angiogram performed. There is active contrast extravasation compatible with arterial bleeding from a peripheral right colic branch at the level of the hepatic flexure.  A Renegade micro catheter and micro Glidewire were utilized to select peripheral right colic branches. Selective right colic angiograms performed throughout the hepatic flexure region of the colon. A peripheral right colic branch was eventually catheterize close to the area of  active bleeding on previous angiograms. This area was interrogated extensively. 70 mcg  nitroglycerin was also given to provoke bleeding in the region. Despite this, no definite active bleeding was demonstrated. However when correlating with the prior study this was close to the original extravasation site. To decrease arterial pressure in the marginal branches, one 3 mm x 6 mm interlock coil was deployed within a peripheral right colic marginal branch.  Micro catheter was retracted. A second right colic branch was catheterized directed to the hepatic flexure. Angiogram of this area demonstrated collateral communication through marginal branches. From a peripheral right colic branch, active bleeding was still present.  The micro catheter was retracted and advanced into the active bleeding right colic branch over a Glidewire. Access was advanced peripheral within the right colic suspicious branch. Selective angiogram within this third right colic branch confirmed active bleeding within the hepatic flexure of the colon with contrast opacifying the colonic lumen.  From this peripheral location, one 2 mm x 3 mm Interlock coil and two 2 mm x 4 cm interlock coils were deployed in the peripheral right colic branch. Following this, several repeat post embolization angiograms were repeated over approximately 10 min. No further active bleeding was demonstrated.  Catheter removed. Hemostasis obtained with a 6 [place] Starclose device. No immediate complication. Patient tolerated the procedure well.  IMPRESSION: Initial SMA angiogram confirms contrast extravasation from a peripheral right colic branch into the hepatic flexure of the colon compatible with arterial bleeding. This correlates with the nuclear medicine scan.  Peripheral micro catheter access was successful at accessing the peripheral SMA right colic branch to perform micro coil embolization. Post embolization, no further active bleeding could be demonstrated.   Electronically Signed   By: Daryll Brod M.D.   On: 04/29/2013 15:52   Ct Maxillofacial Wo  Cm  04/25/2013   CLINICAL DATA:  Fall.  Bruising to left orbit.  EXAM: CT HEAD WITHOUT CONTRAST  CT MAXILLOFACIAL WITHOUT CONTRAST  TECHNIQUE: Multidetector CT imaging of the head and maxillofacial structures were performed using the standard protocol without intravenous contrast. Multiplanar CT image reconstructions of the maxillofacial structures were also generated.  COMPARISON:  None.  FINDINGS: CT HEAD FINDINGS  Diffuse, extensive cerebral atrophy. Dilated extra-axial CSF spaces over the cerebral hemispheres bilaterally most likely related to the volume loss, or this could be related to chronic subdural hygromas. No hemorrhage. No mass lesion or acute infarction. No hydrocephalus.  No acute calvarial abnormality. Left orbital fractures noted. See maxillofacial report below.  CT MAXILLOFACIAL FINDINGS  Fracture through the left lateral orbital wall and orbital floor. Probable nondisplaced zygomatic arch fracture on the left. Fracture also noted posteriorly within the lateral posterior orbital wall on image 68 of series 6. This is also noted on image 71 of series 6. The fracture line continues laterally into the sphenoid wing and superiorly into the squamosal portion of the left temporal bone.  Small amount of fluid noted in the left maxillary sinus. Remainder the paranasal sinuses are clear. Mastoids are clear. Globes are intact.  IMPRESSION: Diffuse cerebral atrophy. Prominent extra-axial low-density fluid likely related to the atrophy although these could represent chronic subdural hygromas.  No acute intracranial abnormality.  Complex left orbital fracture including anterior and posterior lateral walls, extending laterally into the sphenoid wing, and likely into the squamosal portion of the temporal bone. Left orbital floor fracture. Nondisplaced zygomatic arch fracture.   Electronically Signed   By: Rolm Baptise M.D.   On: 04/25/2013 17:32  ASSESSMENT:  1. Acute GI bleed - s/p diverticular bleed with  coil embolization. Hbg improved after PRBC's  2. Hypovolemic shock resolved and now off pressors with stable BP  3. Dyslipidemia  4. Prediabetes  5. Syncope with orbital fracture  6. Anemia secondary to #1  7. NSTEMI most likely type II secondary to demand ischemia from GI bleed and profound anemia +/- underlying CAD. 2D echo with normal LVF. Troponin peak fairly high but trending downward. I suspect he has underlying CAD given how high troponin peaked but given his age would treat medically at this time. I discussed options with his son who stated that patient just wants to be at home and would agree with medical therapy at this time.  - beta blocker started and tolerating well  - no ASA due to anemia and recent GI Bleed  8. Hypokalemia - repleted  9. Acute diastolic CHF - appears resolved   PLAN:  1. Severe anemia with in setting of NSTEMI - Hbg now 9.8 after PRBC's 2. Further cardiac workup on hold until GI bleed issues fully resolved. At this time would opt for medical management given advanced age and comorbidities. I discussed this today with patient and his son.  Any intervention may be problematic given recent GI bleed. Even though he underwent coiling, risk of rebleed may still be potential threat.  Would need to have GI weigh in on this.       Sueanne Margarita, MD  05/02/2013  8:52 AM

## 2013-05-02 NOTE — Progress Notes (Signed)
[facility] PROGRESS NOTE  [place] NELLI [id number] DOB: Jul 10, 1922 DOA: 04/25/2013 PCP: Alesia Richards, MD  Assessment/Plan:  Hypovolemic shock.  -Patient having a GI bleed overnight, and continued this morning.  -Systolic blood pressures falling into the 70s for which he was transferred to the intensive care unit.  -Pulmonary critical care medicine involved, started on IV pressors. (Levophed off  3/13) -Will continue monitoring H&H, transfuse, IV fluid resuscitation.  -4 unit PRBC 04/27/2013 -3 Unit PRBC 04/28/2013  -1 unit PRBC 04/30/2013 -1 unit PRBC 05/01/2013 -3/13 Patient still does not have good by mouth intake, start LR at 75 ml/hr -3/14 will stop LR and start D5 1/4NS at 72ml/hr -3/15 overnight patient developed some rhonchi will decrease D5 1/4 NS to KVO  Upper GI bleed  -Gastroenterology consulted  -nuclear medicine bleeding scan; see results below -SMA embolization performed 04/28/2013 see results will -Continue Protonix to 40 mg BID -3/15 Patient's hemoglobin stable however had 2 large soft maroon stools per RN note overnight. Advised GI of findings, Dr. Delfin Edis (GI) contact IR and discuss plan of care  Acute hemorrhagic anemia  -Per cardiology maintain hemoglobin> 9 and is 6.2 overnight as he was typed and crossed and transfused with packed red blood cells  -Noted by nursing staff to have 2 large soft maroon stools overnight, rebleed?    Syncope  -Initial CT scan of brain negative  -Troponins were cycled, come back positive with peak troponin of 1.55 as cardiology was consulted.  -Patient was able to ambulate on 3/14 without any signs or symptoms of syncope.  Hypotension - Resolved patient off of Levophed recent 3/13 -3/13 Lactated Ringer's at 75 ml/hr (will also help with patient's hypokalemia) -3/14 will stop LR and start D5 1/4NS at 74ml/hr -3/15 D5 1/14 NS to [facility]  Elevated troponins/NSTEMI -Could be secondary to demand ischemia from anemia   -Continue blood transfusions (patient received 4 unit PRBC 3/10) -3/14 Cardiology following recommends transfuse patient to maintain hemoglobin> 9 -3/13 Restarted metoprolol at 12.5 mg BID  -3/14 patient's hemoglobin<9, will transfuse one unit PRBC   Diastolic CHF -Mild; see elevated troponins  Hypokalemia -Potassium goal> 96meq/L -Replete and check in the a.m. -3/15 within AHA guidelines for CHF  Hypomagnesemia -Magnesium goal> 2mg /dL -Replete and check in the a.m. -3/15 within AHA guidelines for CHF  Hyperlipidemia  -Will speak with son concerning patient's listed in tolerance of statins   Prediabetes  - Obtain hemoglobin A1c, continue sliding-scale insulin   Nondisplaced Left periorbital fracture  - Evaluated by Dr. Benjamine Mola, does not feel patient needs any surgical intervention at this time and follow up outpatient one week after dc  Right hip pain -start tramadol PRN      Code Status: Full Family Communication: Son present Disposition Plan: Per GI   Consultants: Dr. Delfin Edis (GI) Dr. Merrie Roof (critical-care)   Procedures: IR EMBO ART VEN HEMORR LYMPH EXTRAV INC GUIDE ROADMAPPING [id number] (Type: Custom)] 04/28/2013  Successful peripheral SMA rt colic branch 69mm and 39mm coil embolization for an acute lower GI bleed angiographically in the hepatic flexure  Nuclear medicine GI blood cell scan 04/28/2013 Abnormal GI bleeding exam demonstrating what appears to be small  bowel localization of tracer in the upper mid abdomen in right upper  quadrant, question duodenal versus proximal jejunal origin.  No definite colonic localization of labeled erythrocytes is  identified.   EGD by Dr. Olevia Perches 04/27/2013 Small reducible hiatal hernia  No blood or bleeding from upper GI tract  No  lesions to account for GIB  Shortly following completion of the EGD, pt evacuated large volume of maroon stool  Central venous catheter insertion 04/27/2013   Echocardiogram  04/26/2013 Left ventricle: The cavity size was normal. Wall thickness was normal.  -LVEF= 55% to 60%.  -(grade 1 diastolic dysfunction). - Aortic valve: AV is thickened, calcified with minimally restricted motion. - Mitral valve: Mild regurgitation.     Antibiotics:    HPI/Subjective: Patient is an 80 yo gentleman  PMHx  hypertension, dyslipidemia, anemia, admitted to the medicine service on 04/25/2013. Family members that reported that patient was highly functional, able to perform all activities of daily living. It appears on the morning of admission he may have had a syncopal event that resulted in head injury. He was brought to the emergency department where CT scan showed a left paravertebral fracture. Was seen and evaluated by ENT, patient not felt to warrant surgical intervention. On the evening of 04/26/2013 he had 3 episodes of bloody stools. Repeat hemoglobin and hematocrit came back low at 6.2 and 17.7. He was typed and crossed and transfused with one unit of packed red blood cells. On the following morning he was administered a second unit of packed red blood cells. At 11:40 AM on 04/27/2013 his blood pressures have remained stable having blood pressure 120/90 then he was sat up with a rapid fall in his blood pressures to 71/49. Transfusion of packed red blood cells were continued however despite this his blood pressures remained low and could not undergo upper endoscopy. At this point he was transferred to the intensive care unit where he was administered blood and started on IV pressors. Patient's son present at bedside was updated on condition. 3/12 patient in much better spirits today. Answers questions appropriately requested no when he can be discharged. 3/13 patient sitting in chair, eating a small amount of his breakfast. Mentation greatly improved, but still borderline hypotensive off Levophed. Complains of right hip pain secondary to arthritis which is chronic 3/14 patient states  right hip pain improved with the tramadol and is ready to start attempting to ambulate with physical therapies help. 3/15 patient states negative N./V./D. States the maroon-colored stools were not frank diarrhea however were soft and a significant amount. Negative lightheadedness, negative abdominal pain.  Objective: Filed Vitals:   05/01/13 2351 05/02/13 0333 05/02/13 0439 05/02/13 0740  BP: 146/61 128/73 159/68 122/88  Pulse:      Temp: 98 F (36.7 C)  97.6 F (36.4 C) 97.5 F (36.4 C)  TempSrc: Oral  Oral Oral  Resp: 27 20 25    Height:      Weight:  87.3 kg (192 lb 7.4 oz)    SpO2: 91% 94% 97% 94%    Intake/Output Summary (Last 24 hours) at 05/02/13 0817 Last data filed at 05/02/13 0600  Gross per 24 hour  Intake 3076.25 ml  Output   1351 ml  Net 1725.25 ml   Filed Weights   04/26/13 2355 04/27/13 0418 05/02/13 0333  Weight: 79.153 kg (174 lb 8 oz) 79.334 kg (174 lb 14.4 oz) 87.3 kg (192 lb 7.4 oz)    Exam:   General: A./O. x4, NAD,   Cardiovascular: Regular rhythm and rate, negative murmurs rubs or gallops  Respiratory: Mild rhonchi right lower/right middle lobe to auscultation, left lobe clear to auscultation al    Abdomen: Soft, nontender, nondistended, normal bowel sounds  Musculoskeletal: Negative pedal edema, , bruise under left eye consistent with fall/fracture slowly improving.  Data Reviewed: Basic Metabolic Panel:  Recent Labs Lab 04/29/13 0456 04/29/13 1355 04/29/13 2221 04/30/13 0500 05/01/13 0500 05/02/13 0340  NA 142  --  139 140 140 136*  K 3.4* 3.3* 3.6* 3.5* 4.7 4.3  CL 112  --  108 107 110 104  CO2 14*  --  20 22 21 21   GLUCOSE 174*  --  182* 179* 137* 166*  BUN 48*  --  28* 24* 25* 20  CREATININE 0.99  --  0.91 0.85 0.83 0.70  CALCIUM 7.5*  --  7.1* 7.2* 7.4* 7.5*  MG 1.7  --   --  2.1 2.3 2.0   Liver Function Tests:  Recent Labs Lab 04/26/13 0125 04/29/13 0456 04/30/13 0500 05/01/13 0500 05/02/13 0340  AST 18 71* 40* 31  27  ALT 10 30 27 25 26   ALKPHOS 47 35* 33* 35* 51  BILITOT 0.3 0.4 0.5 0.4 0.4  PROT 5.1* 4.1* 4.0* 4.2* 4.5*  ALBUMIN 2.7* 2.3* 2.0* 1.9* 1.9*   No results found for this basename: LIPASE, AMYLASE,  in the last 168 hours No results found for this basename: AMMONIA,  in the last 168 hours CBC:  Recent Labs Lab 04/26/13 0125  04/29/13 0456  04/29/13 2334 04/30/13 0500 05/01/13 0500 05/01/13 2222 05/02/13 0340  WBC 7.0  < > 11.0*  < > 10.3 10.4 7.8 10.3 10.1  NEUTROABS 6.1  --  8.7*  --   --  8.7* 6.7  --  8.9*  HGB 8.4*  < > 9.8*  < > 7.4* 7.7* 8.1* 10.0* 9.8*  HCT 24.0*  < > 27.1*  < > 20.3* 21.3* 22.8* 28.7* 28.4*  MCV 98.8  < > 88.3  < > 88.3 88.4 89.1 88.9 89.6  PLT 155  < > 108*  < > 87* 89* 73* 91* 94*  < > = values in this interval not displayed. Cardiac Enzymes:  Recent Labs Lab 04/26/13 0125 04/26/13 0900 04/26/13 1301 04/26/13 2150 04/28/13 1046 04/29/13 0030 04/29/13 0730  CKTOTAL  --  504* 619* 853*  --   --   --   CKMB  --  16.4* 17.3*  --   --   --   --   TROPONINI 0.64* 1.55* 1.54* 2.79* 15.27* 19.26* 14.25*   BNP (last 3 results)  Recent Labs  04/29/13 0730  PROBNP 4803.0*   CBG:  Recent Labs Lab 04/28/13 1818 04/28/13 2027 04/29/13 0105 04/29/13 0346 04/29/13 0810  GLUCAP 166* 155* 173* 173* 135*    Recent Results (from the past 240 hour(s))  MRSA PCR SCREENING     Status: None   Collection Time    04/27/13  2:38 PM      Result Value Ref Range Status   MRSA by PCR NEGATIVE  NEGATIVE Final   Comment:            The GeneXpert MRSA Assay (FDA     approved for NASAL specimens     only), is one component of a     comprehensive MRSA colonization     surveillance program. It is not     intended to diagnose MRSA     infection nor to guide or     monitor treatment for     MRSA infections.     Studies: No results found.  Scheduled Meds: . cholecalciferol  400 Units Oral Daily  . feeding supplement (RESOURCE BREEZE)  1 Container  Oral BID BM  . metoprolol tartrate  12.5 mg Oral BID  . pantoprazole  40 mg Oral Daily  . sodium chloride  500 mL Intravenous Once   Continuous Infusions: . sodium chloride Stopped (04/30/13 2000)  . dextrose 5 % and 0.2 % NaCl 75 mL (05/02/13 0144)    Principal Problem:   Upper GI bleed Active Problems:   Hyperlipidemia   Hypertension   Prediabetes   Syncope   Orbital fracture   Anemia   Hemorrhage of gastrointestinal tract, unspecified   Hemorrhagic shock   NSTEMI (non-ST elevated myocardial infarction)   Nonspecific (abnormal) findings on radiological and other examination of gastrointestinal tract   Diverticular hemorrhage   Diastolic CHF   Hypokalemia   Hypomagnesemia   Diverticulitis of colon (without mention of hemorrhage)   Hip pain, right    Time spent: 35 minute   Cochise Dinneen, [place], J  [facility] Pager [phone]. If 7PM-7AM, please contact night-coverage at [email or web address], password [facility] 05/02/2013, 8:17 AM  LOS: 7 days

## 2013-05-02 NOTE — Progress Notes (Signed)
Patient had a recurrent bloody stools this morning. I have spoken to the interventional radiologist who suggests to repeat bleeding scan in case patient continues to bleed because he feels that the bleeding sites may be different than the original right colon bleed.tel # to [employer] [phone]

## 2013-05-02 NOTE — Progress Notes (Signed)
Notified Md pt sob and more Rhonchi.  Fluids going at 75/hr  Will Kvo and give 20 mg lasix iv.  Will continue to monitor. Saunders Revel T

## 2013-05-03 ENCOUNTER — Inpatient Hospital Stay (HOSPITAL_COMMUNITY): Payer: Medicare PPO

## 2013-05-03 LAB — COMPREHENSIVE METABOLIC PANEL
ALBUMIN: 2 g/dL — AB (ref 3.5–5.2)
ALT: 27 U/L (ref 0–53)
AST: 24 U/L (ref 0–37)
Alkaline Phosphatase: 48 U/L (ref 39–117)
BUN: 19 mg/dL (ref 6–23)
CALCIUM: 8 mg/dL — AB (ref 8.4–10.5)
CO2: 24 mEq/L (ref 19–32)
Chloride: 106 mEq/L (ref 96–112)
Creatinine, Ser: 0.79 mg/dL (ref 0.50–1.35)
GFR calc Af Amer: >90 mL/min (ref >90)
GFR calc non Af Amer: 78 mL/min — ABNORMAL LOW (ref >90)
Glucose, Bld: 169 mg/dL — ABNORMAL HIGH (ref 70–99)
Potassium: 4.1 mEq/L (ref 3.7–5.3)
Sodium: 140 mEq/L (ref 137–147)
Total Bilirubin: 0.5 mg/dL (ref 0.3–1.2)
Total Protein: 4.8 g/dL — ABNORMAL LOW (ref 6.0–8.3)

## 2013-05-03 LAB — CBC WITH DIFFERENTIAL/PLATELET
BASOS PCT: 0 % (ref 0–1)
Basophils Absolute: 0 10*3/uL (ref 0.0–0.1)
EOS ABS: 0.1 10*3/uL (ref 0.0–0.7)
EOS PCT: 1 % (ref 0–5)
HEMATOCRIT: 28.9 % — AB (ref 39.0–52.0)
HEMOGLOBIN: 9.9 g/dL — AB (ref 13.0–17.0)
LYMPHS ABS: 0.8 10*3/uL (ref 0.7–4.0)
Lymphocytes Relative: 8 % — ABNORMAL LOW (ref 12–46)
MCH: 30.7 pg (ref 26.0–34.0)
MCHC: 34.3 g/dL (ref 30.0–36.0)
MCV: 89.5 fL (ref 78.0–100.0)
MONO ABS: 0.8 10*3/uL (ref 0.1–1.0)
MONOS PCT: 8 % (ref 3–12)
Neutro Abs: 8 10*3/uL — ABNORMAL HIGH (ref 1.7–7.7)
Neutrophils Relative %: 83 % — ABNORMAL HIGH (ref 43–77)
Platelets: 125 10*3/uL — ABNORMAL LOW (ref 150–400)
RBC: 3.23 MIL/uL — AB (ref 4.22–5.81)
RDW: 17.6 % — ABNORMAL HIGH (ref 11.5–15.5)
WBC: 9.7 10*3/uL (ref 4.0–10.5)

## 2013-05-03 LAB — MAGNESIUM: Magnesium: 1.9 mg/dL (ref 1.5–2.5)

## 2013-05-03 MED ORDER — TECHNETIUM TC 99M-LABELED RED BLOOD CELLS IV KIT
25.0000 | PACK | Freq: Once | INTRAVENOUS | Status: AC | PRN
  Administered 2013-05-03: 25 via INTRAVENOUS

## 2013-05-03 NOTE — Progress Notes (Signed)
Patient seen, examined, and I agree with the above documentation, including the assessment and plan. Repeat tagged scan negative.  Hgb is stable and he has not had evidence of overt rebleeding.   Presumed diverticular hemorrhage s/p coil embolization at angiography Would continue to monitor.  Continue clears and advance tomorrow if hgb stable and no rebleeding.

## 2013-05-03 NOTE — Progress Notes (Signed)
       Patient Name: Philip Simpson Date of Encounter: 05/03/2013    SUBJECTIVE: The patient is off the floor having a GI procedure  TELEMETRY:  Has been maintaining sinus rhythm Filed Vitals:   05/02/13 2326 05/03/13 0347 05/03/13 0800 05/03/13 0924  BP: 112/76 127/61 137/76   Pulse:    92  Temp: 98.5 F (36.9 C) 97.4 F (36.3 C) 97.9 F (36.6 C)   TempSrc: Oral Oral Oral   Resp: 21 15 19    Height:      Weight:  188 lb 0.8 oz (85.3 kg)    SpO2: 91% 93% 100% 91%    Intake/Output Summary (Last 24 hours) at 05/03/13 1209 Last data filed at 05/03/13 1100  Gross per 24 hour  Intake    940 ml  Output    627 ml  Net    313 ml    LABS: Basic Metabolic Panel:  Recent Labs  05/02/13 0340 05/03/13 0410  NA 136* 140  K 4.3 4.1  CL 104 106  CO2 21 24  GLUCOSE 166* 169*  BUN 20 19  CREATININE 0.70 0.79  CALCIUM 7.5* 8.0*  MG 2.0 1.9   CBC:  Recent Labs  05/02/13 0340 05/03/13 0410  WBC 10.1 9.7  NEUTROABS 8.9* 8.0*  HGB 9.8* 9.9*  HCT 28.4* 28.9*  MCV 89.6 89.5  PLT 94* 125*     Radiology/Studies:  No recent data  Physical Exam: Blood pressure 137/76, pulse 92, temperature 97.9 F (36.6 C), temperature source Oral, resp. rate 19, height 5\' 7"  (1.702 m), weight 188 lb 0.8 oz (85.3 kg), SpO2 91.00%. Weight change: -4 lb 6.6 oz (-2 kg)   Not examined  ASSESSMENT:  1. Non-ST elevation acute coronary syndrome most likely type.2  Plan:  1. Will continue to follow, however invasive evaluation and consideration of stenting is off the table at this point until the GI situation can be controlled. 2. We will continue to follow and help as needed  Signed, Sinclair Grooms 05/03/2013, 12:09 PM

## 2013-05-03 NOTE — Progress Notes (Addendum)
[facility] PROGRESS NOTE  Philip Simpson [id number] DOB: [birth year] DOA: 04/25/2013 PCP: Alesia Richards, MD  Assessment/Plan:  Hypovolemic shock.  -Patient having a GI bleed overnight, and continued this morning.  -Systolic blood pressures falling into the 70s for which he was transferred to the intensive care unit.  -Pulmonary critical care medicine involved, started on IV pressors. (Levophed off  3/13) -Will continue monitoring H&H, transfuse, IV fluid resuscitation.  -4 unit PRBC 04/27/2013 -3 Unit PRBC 04/28/2013  -1 unit PRBC 04/30/2013 -1 unit PRBC 05/01/2013 -3/13 Patient still does not have good by mouth intake, start LR at 75 ml/hr -3/14 will stop LR and start D5 1/4NS at 18ml/hr -3/15 overnight patient developed some rhonchi will decrease D5 1/4 NS to KVO  Upper GI bleed  -Gastroenterology consulted  -nuclear medicine bleeding scan; see results below -SMA embolization performed 04/28/2013 see results will -Continue Protonix to 40 mg BID -3/15 Patient's hemoglobin stable however had 2 large soft maroon stools per RN note overnight. Advised GI of findings, Dr. Delfin Edis (GI) contact IR and discuss plan of care -3/16 S/P Nuclear medicine gastrointestinal bleeding scan(see results below)  Acute hemorrhagic anemia  -Per cardiology maintain hemoglobin> 9 and is 6.2 overnight as he was typed and crossed and transfused with packed red blood cells  -Noted by nursing staff to have 2 large soft maroon stools overnight, rebleed?    Syncope  -Initial CT scan of brain negative  -Troponins were cycled, come back positive with peak troponin of 1.55 as cardiology was consulted.  -Patient was able to ambulate on 3/14 without any signs or symptoms of syncope.  Hypotension - Resolved patient off of Levophed recent 3/13 -3/13 Lactated Ringer's at 75 ml/hr (will also help with patient's hypokalemia) -3/14 will stop LR and start D5 1/4NS at 22ml/hr -3/15 D5 1/14 NS to  [facility]  Elevated troponins/NSTEMI -Could be secondary to demand ischemia from anemia  -Continue blood transfusions (patient received 4 unit PRBC 3/10) -3/14 Cardiology following recommends transfuse patient to maintain hemoglobin> 9 -3/13 Restarted metoprolol at 12.5 mg BID  -3/14 patient's hemoglobin<9, will transfuse one unit PRBC -3/16 hemoglobin holding stable   Diastolic CHF -Mild; see elevated troponins  Hypokalemia -Potassium goal> 73meq/L -Replete and check in the a.m. -3/16 within [facility] guidelines for CHF   Hypomagnesemia -Magnesium goal> 2mg /dL -Replete and check in the a.m. -3/16 slightly low for AHA guidelines for CHF, will most likely need to replace in the a.m.    Hyperlipidemia  -Will speak with son concerning patient's listed in tolerance of statins   Prediabetes  - Obtain hemoglobin A1c, continue sliding-scale insulin   Nondisplaced Left periorbital fracture  - Evaluated by Dr. Benjamine Mola, does not feel patient needs any surgical intervention at this time and follow up outpatient one week after D/C  Right hip pain -continue tramadol PRN      Code Status: Full Family Communication: Son present Disposition Plan: Per GI   Consultants: Dr. Delfin Edis (GI) Dr. Merrie Roof (critical-care)   Procedures: Nuclear medicine gastrointestinal bleeding scan/16/2015 1. Negative exam. No evidence for active GI bleeding.   IR EMBO ART VEN HEMORR LYMPH EXTRAV INC GUIDE ROADMAPPING [id number] (Type: Custom)] 04/28/2013  Successful peripheral SMA rt colic branch 28mm and 54mm coil embolization for an acute lower GI bleed angiographically in the hepatic flexure  Nuclear medicine GI blood cell scan 04/28/2013 Abnormal GI bleeding exam demonstrating what appears to be small  bowel localization of tracer in the upper mid abdomen in  right upper  quadrant, question duodenal versus proximal jejunal origin.  No definite colonic localization of labeled erythrocytes is   identified.   EGD by Dr. Olevia Perches 04/27/2013 Small reducible hiatal hernia  No blood or bleeding from upper GI tract  No lesions to account for GIB  Shortly following completion of the EGD, pt evacuated large volume of maroon stool  Central venous catheter insertion 04/27/2013   Echocardiogram 04/26/2013 Left ventricle: The cavity size was normal. Wall thickness was normal.  -LVEF= 55% to 60%.  -(grade 1 diastolic dysfunction). - Aortic valve: AV is thickened, calcified with minimally restricted motion. - Mitral valve: Mild regurgitation.     Antibiotics:    HPI/Subjective: Patient is an 80 yo gentleman  PMHx  hypertension, dyslipidemia, anemia, admitted to the medicine service on 04/25/2013. Family members that reported that patient was highly functional, able to perform all activities of daily living. It appears on the morning of admission he may have had a syncopal event that resulted in head injury. He was brought to the emergency department where CT scan showed a left paravertebral fracture. Was seen and evaluated by ENT, patient not felt to warrant surgical intervention. On the evening of 04/26/2013 he had 3 episodes of bloody stools. Repeat hemoglobin and hematocrit came back low at 6.2 and 17.7. He was typed and crossed and transfused with one unit of packed red blood cells. On the following morning he was administered a second unit of packed red blood cells. At 11:40 AM on 04/27/2013 his blood pressures have remained stable having blood pressure 120/90 then he was sat up with a rapid fall in his blood pressures to 71/49. Transfusion of packed red blood cells were continued however despite this his blood pressures remained low and could not undergo upper endoscopy. At this point he was transferred to the intensive care unit where he was administered blood and started on IV pressors. Patient's son present at bedside was updated on condition. 3/12 patient in much better spirits today.  Answers questions appropriately requested no when he can be discharged. 3/13 patient sitting in chair, eating a small amount of his breakfast. Mentation greatly improved, but still borderline hypotensive off Levophed. Complains of right hip pain secondary to arthritis which is chronic 3/14 patient states right hip pain improved with the tramadol and is ready to start attempting to ambulate with physical therapies help. 3/15 patient states negative N./V./D. States the maroon-colored stools were not frank diarrhea however were soft and a significant amount. Negative lightheadedness, negative abdominal pain.3/16 S/P Nuclear medicine gastrointestinal bleeding scan(see results above). Patient with no complaints.  Objective: Filed Vitals:   05/03/13 0924 05/03/13 1200 05/03/13 1600 05/03/13 2125  BP:  137/74 150/75 144/72  Pulse: 92   95  Temp:  97.5 F (36.4 C) 97.8 F (36.6 C)   TempSrc:  Oral Oral   Resp:  18 21   Height:      Weight:      SpO2: 91% 91% 94%     Intake/Output Summary (Last 24 hours) at 05/03/13 2143 Last data filed at 05/03/13 1800  Gross per 24 hour  Intake   1020 ml  Output    727 ml  Net    293 ml   Filed Weights   04/27/13 0418 05/02/13 0333 05/03/13 0347  Weight: 79.334 kg (174 lb 14.4 oz) 87.3 kg (192 lb 7.4 oz) 85.3 kg (188 lb 0.8 oz)    Exam:   General: A./O. x4, NAD,  Cardiovascular: Regular rhythm and rate, negative murmurs rubs or gallops  Respiratory: Mild rhonchi right lower/right middle lobe to auscultation, left lobe clear to auscultation al    Abdomen: Soft, nontender, nondistended, normal bowel sounds  Musculoskeletal: Negative pedal edema, , bruise under left eye consistent with fall/fracture slowly improving.   Data Reviewed: Basic Metabolic Panel:  Recent Labs Lab 04/29/13 0456  04/29/13 2221 04/30/13 0500 05/01/13 0500 05/02/13 0340 05/03/13 0410  NA 142  --  139 140 140 136* 140  K 3.4*  < > 3.6* 3.5* 4.7 4.3 4.1  CL 112  --   108 107 110 104 106  CO2 14*  --  20 22 21 21 24   GLUCOSE 174*  --  182* 179* 137* 166* 169*  BUN 48*  --  28* 24* 25* 20 19  CREATININE 0.99  --  0.91 0.85 0.83 0.70 0.79  CALCIUM 7.5*  --  7.1* 7.2* 7.4* 7.5* 8.0*  MG 1.7  --   --  2.1 2.3 2.0 1.9  < > = values in this interval not displayed. Liver Function Tests:  Recent Labs Lab 04/29/13 0456 04/30/13 0500 05/01/13 0500 05/02/13 0340 05/03/13 0410  AST 71* 40* 31 27 24   ALT 30 27 25 26 27   ALKPHOS 35* 33* 35* 51 48  BILITOT 0.4 0.5 0.4 0.4 0.5  PROT 4.1* 4.0* 4.2* 4.5* 4.8*  ALBUMIN 2.3* 2.0* 1.9* 1.9* 2.0*   No results found for this basename: LIPASE, AMYLASE,  in the last 168 hours No results found for this basename: AMMONIA,  in the last 168 hours CBC:  Recent Labs Lab 04/29/13 0456  04/30/13 0500 05/01/13 0500 05/01/13 2222 05/02/13 0340 05/03/13 0410  WBC 11.0*  < > 10.4 7.8 10.3 10.1 9.7  NEUTROABS 8.7*  --  8.7* 6.7  --  8.9* 8.0*  HGB 9.8*  < > 7.7* 8.1* 10.0* 9.8* 9.9*  HCT 27.1*  < > 21.3* 22.8* 28.7* 28.4* 28.9*  MCV 88.3  < > 88.4 89.1 88.9 89.6 89.5  PLT 108*  < > 89* 73* 91* 94* 125*  < > = values in this interval not displayed. Cardiac Enzymes:  Recent Labs Lab 04/26/13 2150 04/28/13 1046 04/29/13 0030 04/29/13 0730  CKTOTAL 853*  --   --   --   TROPONINI 2.79* 15.27* 19.26* 14.25*   BNP (last 3 results)  Recent Labs  04/29/13 0730  PROBNP 4803.0*   CBG:  Recent Labs Lab 04/28/13 1818 04/28/13 2027 04/29/13 0105 04/29/13 0346 04/29/13 0810  GLUCAP 166* 155* 173* 173* 135*    Recent Results (from the past 240 hour(s))  MRSA PCR SCREENING     Status: None   Collection Time    04/27/13  2:38 PM      Result Value Ref Range Status   MRSA by PCR NEGATIVE  NEGATIVE Final   Comment:            The GeneXpert MRSA Assay (FDA     approved for NASAL specimens     only), is one component of a     comprehensive MRSA colonization     surveillance program. It is not     intended  to diagnose MRSA     infection nor to guide or     monitor treatment for     MRSA infections.     Studies: Nm Gi Blood Loss  05/03/2013   CLINICAL DATA:  Bloody stool  EXAM: NUCLEAR MEDICINE GASTROINTESTINAL BLEEDING  SCAN  TECHNIQUE: Sequential abdominal images were obtained following intravenous administration of Tc-44m labeled red blood cells.  RADIOPHARMACEUTICALS:  25MILLI CURIE ULTRATAG TECHNETIUM TC 60M-LABELED RED BLOOD CELLS IV KITmCi Tc-82m in-vitro labeled red cells.  COMPARISON:  None.  FINDINGS: Dynamic imaging was carried out for 120 knee min. No abnormal focus of increased radiotracer uptake is identified to suggest active GI bleeding.  IMPRESSION: 1. Negative exam.  No evidence for active GI bleeding.   Electronically Signed   By: Kerby Moors M.D.   On: 05/03/2013 14:06    Scheduled Meds: . antiseptic oral rinse  15 mL Mouth Rinse BID  . cholecalciferol  400 Units Oral Daily  . feeding supplement (RESOURCE BREEZE)  1 Container Oral BID BM  . metoprolol tartrate  12.5 mg Oral BID  . pantoprazole  40 mg Oral Daily  . sodium chloride  500 mL Intravenous Once   Continuous Infusions: . sodium chloride Stopped (04/30/13 2000)  . dextrose 5 % and 0.2 % NaCl 20 mL/hr at 05/03/13 5284    Principal Problem:   Upper GI bleed Active Problems:   Hyperlipidemia   Hypertension   Prediabetes   Syncope   Orbital fracture   Anemia   Hemorrhage of gastrointestinal tract, unspecified   Hemorrhagic shock   NSTEMI (non-ST elevated myocardial infarction)   Nonspecific (abnormal) findings on radiological and other examination of gastrointestinal tract   Diverticular hemorrhage   Diastolic CHF   Hypokalemia   Hypomagnesemia   Diverticulitis of colon (without mention of hemorrhage)   Hip pain, right    Time spent: 35 minute   Corianne Buccellato, [place], J  [facility] Pager [phone]. If 7PM-7AM, please contact night-coverage at [email or web address], password [facility] 05/03/2013, 9:43 PM   LOS: 8 days

## 2013-05-03 NOTE — Progress Notes (Signed)
I spoke with pt and son Louie Casa, who was at bedside. I explained that CIR is looking at pt but that if CIR cannot take pt we need a back-up plan in place for pt to get rehab. Pt expressed hesitation about going to a nursing facility, and that he would prefer to go for rehab here in the hospital. I paged MD asking for CIR consult. I also left a list of the SNFs that can take pt in pt's room, as his son is going to look over this list with pt tonight and will choose a facility as a "plan B" in the event CIR cannot take pt tomorrow. I spoke extensively with pt about his concerns about placement and his preference for inpatient rehab. I provided support to pt after he explained his wife died 29 years ago at [facility]. I received a call from MD stating pt went to have blood work done today and pt might not be medically ready for CIR. MD states after reviewing tests, he will put in CIR consult and pt can participate in CIR screening if he remains medically stable. I explained pt has bed offers from SNFs, including [facility] on [employer], but both MD and I agree that inpatient rehab would be optimal as pt's son works here at the hospital and could visit pt every day.  I spoke with admissions for CIR, who state after they have the formal rehab consult their MD will evaluate pt for appropriateness and will follow-up with pt. I am following case and will provide support/assistance as needed.   Ky Barban, MSW, [facility] Clinical Social Worker [phone]

## 2013-05-03 NOTE — Progress Notes (Signed)
          Daily Rounding Note  05/03/2013, 8:54 AM  LOS: 8 days   SUBJECTIVE:       Small smears of stool overnight and this AM.  Pt denies abd pain, no nausea.  Denies SOB or chest pain. Walked 100 feet today.  Sats in high 80s with walking but pt not experiencing dyspnea.   OBJECTIVE:         Vital signs in last 24 hours:    Temp:  [97.4 F (36.3 C)-98.7 F (37.1 C)] 97.9 F (36.6 C) (03/16 0800) Pulse Rate:  [91] 91 (03/15 2139) Resp:  [15-29] 19 (03/16 0800) BP: (112-157)/(40-76) 137/76 mmHg (03/16 0800) SpO2:  [89 %-100 %] 100 % (03/16 0800) Weight:  [85.3 kg (188 lb 0.8 oz)] 85.3 kg (188 lb 0.8 oz) (03/16 0347) Last BM Date: 05/02/13 General: looks ill.     Heart: RRR Chest: expiratory wheezing. Abdomen: soft, ND, NT.    Extremities: no CCE Neuro/Psych:  Pleasant, alert, no gross deficits.   Intake/Output from previous day: 03/15 0701 - 03/16 0700 In: 1440 [P.O.:960; I.V.:480] Out: 1027 [Urine:1025; Stool:2]  Intake/Output this shift: Total I/O In: 20 [I.V.:20] Out: -   Lab Results:  Recent Labs  05/01/13 2222 05/02/13 0340 05/03/13 0410  WBC 10.3 10.1 9.7  HGB 10.0* 9.8* 9.9*  HCT 28.7* 28.4* 28.9*  PLT 91* 94* 125*   BMET  Recent Labs  05/01/13 0500 05/02/13 0340 05/03/13 0410  NA 140 136* 140  K 4.7 4.3 4.1  CL 110 104 106  CO2 21 21 24   GLUCOSE 137* 166* 169*  BUN 25* 20 19  CREATININE 0.83 0.70 0.79  CALCIUM 7.4* 7.5* 8.0*   LFT  Recent Labs  05/01/13 0500 05/02/13 0340 05/03/13 0410  PROT 4.2* 4.5* 4.8*  ALBUMIN 1.9* 1.9* 2.0*  AST 31 27 24   ALT 25 26 27   ALKPHOS 35* 51 48  BILITOT 0.4 0.4 0.5   PT/INR No results found for this basename: LABPROT, INR,  in the last 72 hours Hepatitis Panel No results found for this basename: HEPBSAG, HCVAB, HEPAIGM, HEPBIGM,  in the last 72 hours  Studies/Results: No results found.  ASSESMENT:   * GI bleed, diverticular bleed.   Resolved post embolization but recurrent 3/15.   EGD 3/10: Small HH, no blood. No source for bleeding  NM RBC scan 3/11 bleeding tracer at RUQ/?jejunal location  Enteroscopy 3/11 past lig of treitz: No bleeding or lesions. endoclip place to help with location on future imaging and interventions.  Angiogram 3/11 with coil embolization of right colic branch of SMA.  * Non STEMI in setting of GIB, ABL with demand ischemia. No heparin, ASA, beta blocker due to GI bleed/shock. Work up on hold due to GIB.  MD favoring medical mgt.  * ABL anemia. 9 units PRBCs thus far. Last on 3/14. Hgb is stable. Dr Radford Pax suggests keeping Hgb 9 or more.  *  Orbital fracture post syncope related fall PTA.  *  Hypo albuminemia.    PLAN   *  RBC scan this AM.  If negative, do we colonoscope him?  If not than would resume solid diet.     Azucena Freed  05/03/2013, 8:54 AM Pager: [phone]

## 2013-05-03 NOTE — Evaluation (Signed)
Occupational Therapy Evaluation Patient Details Name: Philip Simpson MRN: [id number] DOB: 11-30-1922 Today's Date: 05/03/2013 Time: 9485-4627 OT Time Calculation (min): 21 min  OT Assessment / Plan / Recommendation History of present illness 80 y.o. male admitted to [facility] on 04/27/13 with history of hypertension hyperlipidemia prediabetes presents to the ER because of loss of consciousness.  He had head trauma and a facial fx with laceration above the left eye.  NSTEMI with GIB initial PT eval 3/10 with order discontinued same day with transfer to ICU due to hyptension with acute bleeding with hepatic flexion embolization 3/11   Clinical Impression   This 80 yo male admitted with above presents to acute OT with increased pain and decreased mobility both affecting pts ability to take care of himself at home with intermittent A from son for IADLs. WIll benefit from acute OT with follow up OT on CIR.    OT Assessment  Patient needs continued OT Services    Follow Up Recommendations  CIR    Barriers to Discharge Decreased caregiver support    Equipment Recommendations  3 in 1 bedside comode;Tub/shower seat       Frequency  Min 2X/week    Precautions / Restrictions Precautions Precautions: Fall Restrictions Weight Bearing Restrictions: No   Pertinent Vitals/Pain 6/10 RLE and right lower back; repostioned, heat applied, made RN aware.    ADL  Eating/Feeding: Independent Where Assessed - Eating/Feeding: Bed level Grooming: Set up;Supervision/safety Where Assessed - Grooming: Unsupported sitting Upper Body Bathing: Set up;Supervision/safety Where Assessed - Upper Body Bathing: Unsupported sitting Lower Body Bathing: Maximal assistance Where Assessed - Lower Body Bathing: Supported sit to stand Upper Body Dressing: Minimal assistance Where Assessed - Upper Body Dressing: Unsupported sitting Lower Body Dressing: +1 Total assistance Where Assessed - Lower Body Dressing: Supported sit to  stand Toilet Transfer: Minimal assistance Toilet Transfer Method: Sit to Loss adjuster, chartered: Bedside commode Toileting - Clothing Manipulation and Hygiene: Moderate assistance Where Assessed - Toileting Clothing Manipulation and Hygiene: Sit to stand from 3-in-1 or toilet Transfers/Ambulation Related to ADLs: Min A sit<>stand and stand pivot from bed to recliner going to pt's right (did not try and ambulate further due to pt c/o increased pain in back and down RL    OT Diagnosis: Generalized weakness;Acute pain  OT Problem List: Decreased strength;Decreased range of motion;Impaired balance (sitting and/or standing);Pain OT Treatment Interventions: Self-care/ADL training;DME and/or AE instruction;Therapeutic activities;Patient/family education;Balance training   OT Goals(Current goals can be found in the care plan section) Acute Rehab OT Goals Patient Stated Goal: to go to rehab here and then home OT Goal Formulation: With patient Time For Goal Achievement: 05/17/13 Potential to Achieve Goals: Good  Visit Information  Last OT Received On: 05/03/13 Assistance Needed: +1 History of Present Illness: 80 y.o. male admitted to [facility] on 04/27/13 with history of hypertension hyperlipidemia prediabetes presents to the ER because of loss of consciousness.  He had head trauma and a facial fx with laceration above the left eye.  NSTEMI with GIB initial PT eval 3/10 with order discontinued same day with transfer to ICU due to hyptension with acute bleeding with hepatic flexion embolization 3/11       Prior [place] expects to be discharged to:: Private residence Living Arrangements: Alone Available Help at Discharge: Family;Available PRN/intermittently Type of Home: House Home Access: Level entry Home Layout: One level Home Equipment: None Additional Comments: Pt was taking care of himself with periodic assist  from son prior to admission Prior  Function Level of Independence: Independent Communication Communication: HOH Dominant Hand: Right         Vision/Perception Vision - History Patient Visual Report: No change from baseline   Cognition  Cognition Arousal/Alertness: Awake/alert Behavior During Therapy: Restless (due to pain in right hip) Overall Cognitive Status: Within Functional Limits for tasks assessed    Extremity/Trunk Assessment Upper Extremity Assessment Upper Extremity Assessment: Generalized weakness     Mobility Bed Mobility Overal bed mobility: Needs Assistance Bed Mobility: Supine to Sit Supine to sit: Mod assist Transfers Overall transfer level: Needs assistance Equipment used: 1 person hand held assist Transfers: Sit to/from Stand Sit to Stand: Min assist Stand pivot transfers: Min assist           End of Session OT - End of Session Activity Tolerance: Patient limited by pain Patient left: in chair;with call bell/phone within reach Nurse Communication: Patient requests pain meds    Almon Register [id number] 05/03/2013, 3:25 PM

## 2013-05-03 NOTE — Progress Notes (Signed)
Physical Therapy Treatment Patient Details Name: Philip Simpson MRN: [id number] DOB: [birth year] Today's Date: 05/03/2013 Time: 9528-4132 PT Time Calculation (min): 38 min  PT Assessment / Plan / Recommendation  History of Present Illness 80 y.o. male admitted to [facility] on 04/27/13 with history of hypertension hyperlipidemia prediabetes presents to the ER because of loss of consciousness.  He had head trauma and a facial fx with laceration above the left eye.  NSTEMI with GIB initial PT eval 3/10 with order discontinued same day with transfer to ICU due to hyptension with acute bleeding with hepatic flexion embolization 3/11   PT Comments   Pt progressing well with mobility and gait with increased time for transfers and gait. Pt returned to bed per RN request due to pending transfer for procedure. Pt encouraged to be OOB and perform bil LE HEP. Pt with Right hip pain limiting mobility. Pt with sats 88-92% throughout on 5L even with cues for breathing technique and posture.  Follow Up Recommendations  CIR;Supervision/Assistance - 24 hour     Does the patient have the potential to tolerate intense rehabilitation     Barriers to Discharge        Equipment Recommendations  Rolling walker with 5" wheels;3in1 (PT)    Recommendations for Other Services    Frequency     Progress towards PT Goals Progress towards PT goals: Progressing toward goals  Plan Current plan remains appropriate    Precautions / Restrictions Precautions Precautions: Fall Restrictions Weight Bearing Restrictions: No   Pertinent Vitals/Pain 8/10 right hip pain initially, medicated and repositioned and 7/10 end of session Hr 92    Mobility  Bed Mobility Overal bed mobility: Needs Assistance Supine to sit: Min assist Sit to supine: Min assist General bed mobility comments: cues for sequence, use of rail and assist to rotate pelvis and elevate trunk. Pt reported hip pain in sitting and returned to sidelying prior to sitting  a second time for standing Transfers Overall transfer level: Needs assistance Sit to Stand: Min assist Stand pivot transfers: Min assist General transfer comment: cues for hand placement and safety although pt ignoring cues for hand placement for descent and assist to lower to chair Ambulation/Gait Ambulation/Gait assistance: Min assist Ambulation Distance (Feet): 100 Feet Assistive device: Rolling walker (2 wheeled) Gait Pattern/deviations: Step-through pattern;Decreased stride length;Trunk flexed Gait velocity interpretation: <1.8 ft/sec, indicative of risk for recurrent falls General Gait Details: cues for posture and position in RW     Exercises General Exercises - Lower Extremity Short Arc Quad: AROM;15 reps;Right;Seated Long Arc Quad: AROM;Left;15 reps;Seated Hip ABduction/ADduction: AROM;15 reps;Both;Seated Hip Flexion/Marching: AROM;AAROM;15 reps;Right;Left;Seated (AAROM on RLE) Toe Raises: AROM;15 reps;Both;Seated Heel Raises: AROM;15 reps;Both;Seated   PT Diagnosis:    PT Problem List:   PT Treatment Interventions:     PT Goals (current goals can now be found in the care plan section)    Visit Information  Last PT Received On: 05/03/13 Assistance Needed: +1 History of Present Illness: 80 y.o. male admitted to [facility] on 04/27/13 with history of hypertension hyperlipidemia prediabetes presents to the ER because of loss of consciousness.  He had head trauma and a facial fx with laceration above the left eye.  NSTEMI with GIB initial PT eval 3/10 with order discontinued same day with transfer to ICU due to hyptension with acute bleeding with hepatic flexion embolization 3/11    Subjective Data      Cognition  Cognition Arousal/Alertness: Awake/alert Behavior During Therapy: Flat affect Overall Cognitive Status: Within  Functional Limits for tasks assessed    Balance     End of Session PT - End of Session Equipment Utilized During Treatment: Gait belt;Oxygen Activity  Tolerance: Patient tolerated treatment well Patient left: in bed;with call bell/phone within reach Nurse Communication: Mobility status   GP     Lanetta Inch [facility] 05/03/2013, 9:28 AM Elwyn Reach, [place]

## 2013-05-04 DIAGNOSIS — S069X9A Unspecified intracranial injury with loss of consciousness of unspecified duration, initial encounter: Secondary | ICD-10-CM

## 2013-05-04 DIAGNOSIS — S069XAA Unspecified intracranial injury with loss of consciousness status unknown, initial encounter: Secondary | ICD-10-CM

## 2013-05-04 DIAGNOSIS — K922 Gastrointestinal hemorrhage, unspecified: Secondary | ICD-10-CM

## 2013-05-04 DIAGNOSIS — N400 Enlarged prostate without lower urinary tract symptoms: Secondary | ICD-10-CM

## 2013-05-04 DIAGNOSIS — W19XXXA Unspecified fall, initial encounter: Secondary | ICD-10-CM

## 2013-05-04 LAB — COMPREHENSIVE METABOLIC PANEL
ALT: 23 U/L (ref 0–53)
AST: 21 U/L (ref 0–37)
Albumin: 1.9 g/dL — ABNORMAL LOW (ref 3.5–5.2)
Alkaline Phosphatase: 48 U/L (ref 39–117)
BUN: 14 mg/dL (ref 6–23)
CALCIUM: 7.9 mg/dL — AB (ref 8.4–10.5)
CO2: 23 meq/L (ref 19–32)
Chloride: 101 mEq/L (ref 96–112)
Creatinine, Ser: 0.65 mg/dL (ref 0.50–1.35)
GFR calc Af Amer: >90 mL/min (ref >90)
GFR calc non Af Amer: 84 mL/min — ABNORMAL LOW (ref >90)
Glucose, Bld: 239 mg/dL — ABNORMAL HIGH (ref 70–99)
Potassium: 3.4 mEq/L — ABNORMAL LOW (ref 3.7–5.3)
Sodium: 138 mEq/L (ref 137–147)
TOTAL PROTEIN: 4.7 g/dL — AB (ref 6.0–8.3)
Total Bilirubin: 0.6 mg/dL (ref 0.3–1.2)

## 2013-05-04 LAB — GLUCOSE, CAPILLARY
GLUCOSE-CAPILLARY: 131 mg/dL — AB (ref 70–99)
GLUCOSE-CAPILLARY: 155 mg/dL — AB (ref 70–99)
GLUCOSE-CAPILLARY: 159 mg/dL — AB (ref 70–99)
Glucose-Capillary: 158 mg/dL — ABNORMAL HIGH (ref 70–99)

## 2013-05-04 LAB — CBC WITH DIFFERENTIAL/PLATELET
BASOS ABS: 0 10*3/uL (ref 0.0–0.1)
Basophils Relative: 0 % (ref 0–1)
EOS ABS: 0.1 10*3/uL (ref 0.0–0.7)
Eosinophils Relative: 1 % (ref 0–5)
HEMATOCRIT: 28.7 % — AB (ref 39.0–52.0)
HEMOGLOBIN: 9.8 g/dL — AB (ref 13.0–17.0)
Lymphocytes Relative: 7 % — ABNORMAL LOW (ref 12–46)
Lymphs Abs: 0.7 10*3/uL (ref 0.7–4.0)
MCH: 30.7 pg (ref 26.0–34.0)
MCHC: 34.1 g/dL (ref 30.0–36.0)
MCV: 90 fL (ref 78.0–100.0)
Monocytes Absolute: 0.8 10*3/uL (ref 0.1–1.0)
Monocytes Relative: 8 % (ref 3–12)
NEUTROS ABS: 7.9 10*3/uL — AB (ref 1.7–7.7)
NEUTROS PCT: 84 % — AB (ref 43–77)
Platelets: 127 10*3/uL — ABNORMAL LOW (ref 150–400)
RBC: 3.19 MIL/uL — ABNORMAL LOW (ref 4.22–5.81)
RDW: 17.4 % — AB (ref 11.5–15.5)
SMEAR REVIEW: DECREASED
WBC: 9.5 10*3/uL (ref 4.0–10.5)

## 2013-05-04 LAB — HEMOGLOBIN A1C
Hgb A1c MFr Bld: 6 % — ABNORMAL HIGH (ref <5.7)
Mean Plasma Glucose: 126 mg/dL — ABNORMAL HIGH (ref <117)

## 2013-05-04 LAB — MAGNESIUM: MAGNESIUM: 1.7 mg/dL (ref 1.5–2.5)

## 2013-05-04 MED ORDER — MAGNESIUM OXIDE 400 (241.3 MG) MG PO TABS: 400.0000 mg | ORAL_TABLET | Freq: Every day | ORAL | Status: AC

## 2013-05-04 MED ORDER — MAGNESIUM OXIDE 400 (241.3 MG) MG PO TABS
400.0000 mg | ORAL_TABLET | Freq: Every day | ORAL | Status: DC
  Administered 2013-05-04 – 2013-05-05 (×2): 400 mg via ORAL
  Filled 2013-05-04 (×2): qty 1

## 2013-05-04 MED ORDER — PANTOPRAZOLE SODIUM 40 MG PO TBEC: 40.0000 mg | DELAYED_RELEASE_TABLET | Freq: Every day | ORAL | Status: AC

## 2013-05-04 MED ORDER — INSULIN ASPART 100 UNIT/ML ~~LOC~~ SOLN: 0.0000 [IU] | SUBCUTANEOUS | Status: AC

## 2013-05-04 MED ORDER — POTASSIUM CHLORIDE CRYS ER 20 MEQ PO TBCR: 20.0000 meq | EXTENDED_RELEASE_TABLET | Freq: Every day | ORAL | Status: AC

## 2013-05-04 MED ORDER — BOOST / RESOURCE BREEZE PO LIQD: 1.0000 | Freq: Two times a day (BID) | ORAL | Status: AC

## 2013-05-04 MED ORDER — INSULIN ASPART 100 UNIT/ML ~~LOC~~ SOLN
0.0000 [IU] | SUBCUTANEOUS | Status: DC
  Administered 2013-05-04: 1 [IU] via SUBCUTANEOUS
  Administered 2013-05-04: 2 [IU] via SUBCUTANEOUS
  Administered 2013-05-04: 1 [IU] via SUBCUTANEOUS
  Administered 2013-05-04 (×2): 2 [IU] via SUBCUTANEOUS
  Administered 2013-05-05: 1 [IU] via SUBCUTANEOUS
  Administered 2013-05-05: 3 [IU] via SUBCUTANEOUS

## 2013-05-04 MED ORDER — POTASSIUM CHLORIDE CRYS ER 20 MEQ PO TBCR
20.0000 meq | EXTENDED_RELEASE_TABLET | Freq: Every day | ORAL | Status: DC
  Administered 2013-05-04 – 2013-05-05 (×2): 20 meq via ORAL
  Filled 2013-05-04 (×2): qty 1

## 2013-05-04 MED ORDER — TRAMADOL HCL 50 MG PO TABS: 50.0000 mg | ORAL_TABLET | Freq: Four times a day (QID) | ORAL | Status: AC | PRN

## 2013-05-04 MED ORDER — METOPROLOL TARTRATE 12.5 MG HALF TABLET: 12.5000 mg | ORAL_TABLET | Freq: Two times a day (BID) | ORAL | Status: AC

## 2013-05-04 MED ORDER — ALBUTEROL SULFATE (2.5 MG/3ML) 0.083% IN NEBU: 2.5000 mg | INHALATION_SOLUTION | Freq: Four times a day (QID) | RESPIRATORY_TRACT | Status: AC

## 2013-05-04 NOTE — Progress Notes (Addendum)
I noted discharge summary has been signed by MD, and called inpatient rehab to inquire about their bed availability if pt is discharging today. CIR states they do not have any beds today. I paged MD, who called me back and explained pt is not discharging today but MD has completed discharge summary so the MD that sees pt tomorrow can addend it and complete the order. I called CIR back and updated them, asking them to send inpatient rehab MD to evaluate pt as this is still first choice discharge for pt. CIR states they will have 3 beds tomorrow, and 2 are already spoken for so they do not know if they will be able to take pt. Additionally, pt has [employer] and CIR often does not hear back from [facility] for 1-2 days so CIR is recommending I have SNF in place as a back-up plan.  Addendum: I spoke with pt about placement as back-up, as CIR might be full tomorrow if he is ready to discharge tomorrow. Pt states he still would prefer to wait for CIR to have a bed rather than go to SNF. I explained that if pt is discharged and CIR cannot take him, he cannot wait in the hospital for a bed to become available. Pt states his son Louie Casa is coming to the hospital this afternoon and that he will talk with Louie Casa about options. I also called [facility] to ask if they can check pt's insurance coverage, as he is concerned about what his insurance covers and for how long he would be covered at the facility. Facility checking insurance now and will call me back.  Ky Barban, MSW, [facility] Clinical Social Worker [phone]

## 2013-05-04 NOTE — Progress Notes (Signed)
       Patient Name: Philip Simpson Date of Encounter: 05/04/2013    SUBJECTIVE:Feels better. No chest pain or dyspnea.  TELEMETRY:  NSR Filed Vitals:   05/04/13 0000 05/04/13 0324 05/04/13 0400 05/04/13 0800  BP: 136/76  139/78 150/71  Pulse:      Temp: 97.6 F (36.4 C)  97.8 F (36.6 C) 97.9 F (36.6 C)  TempSrc: Oral  Oral Oral  Resp: 22  19 19   Height:      Weight:      SpO2: 96% 95% 95% 94%    Intake/Output Summary (Last 24 hours) at 05/04/13 0931 Last data filed at 05/04/13 0800  Gross per 24 hour  Intake    820 ml  Output    700 ml  Net    120 ml    LABS: Basic Metabolic Panel:  Recent Labs  05/03/13 0410 05/04/13 0420  NA 140 138  K 4.1 3.4*  CL 106 101  CO2 24 23  GLUCOSE 169* 239*  BUN 19 14  CREATININE 0.79 0.65  CALCIUM 8.0* 7.9*  MG 1.9 1.7   CBC:  Recent Labs  05/03/13 0410 05/04/13 0420  WBC 9.7 9.5  NEUTROABS 8.0* 7.9*  HGB 9.9* 9.8*  HCT 28.9* 28.7*  MCV 89.5 90.0  PLT 125* 127*     Radiology/Studies:  No new  Physical Exam: Blood pressure 150/71, pulse 95, temperature 97.9 F (36.6 C), temperature source Oral, resp. rate 19, height 5\' 7"  (1.702 m), weight 188 lb 0.8 oz (85.3 kg), SpO2 94.00%. Weight change:    S4 gallop  ASSESSMENT:  1. Syncope due to anemia 2. NSTEMI II related to anemia possibly superimposed on CAD 3. Severe anemia elated to GI bleed, now stable  Plan:  1. No further cardiac w/u at his age unless CV complaints(angina, dyspnea, edema, etc) with Hgb greater 9.0  Signed, Sinclair Grooms 05/04/2013, 9:31 AM

## 2013-05-04 NOTE — Progress Notes (Signed)
Patient seen, examined, and I agree with the above documentation, including the assessment and plan. Status post coil embolization in the right colon for presumed diverticular hemorrhage. Stable hemoglobin without evidence for overt bleeding.  It is expected that some blood will remain in the colon until he has a full bowel movement Recheck CBC tomorrow, advance diet Will signoff, call with questions

## 2013-05-04 NOTE — Consult Note (Signed)
Physical Medicine and Rehabilitation Consult Reason for Consult: Deconditioning/GI bleed Referring Physician: [facility]   HPI: Philip Simpson is a 80 y.o. male history of hypertension.Patient independent and active PTA living alone. Admitted 04/25/2013 with question loss of consciousness. Patient was found by his son sitting on the commode with a pool of blood around him. Patient does have a laceration with left for left elbow. Noted previous episodes of syncope in the past. Cranial CT scan negative for acute intracranial abnormality there was a left periorbital fracture. EKG with right bundle branch block ST depression in the lateral leads with T wave inversion. Dr. Benjamine Mola followup per ENT for nondisplaced left orbital fracture advise conservative care. Troponins cycled positive x3 cardiology followup. Echocardiogram with ejection fraction [id number] grade 1 diastolic dysfunction. Suspect NSTEMI secondary to demand ischemia from suspect GI bleed and further workup from cardiology standpoint on hold until GI bleed issues resolved. Noted hemoglobin 8.4. Gastroenterology Dr. Olevia Perches consulted and underwent EGD with large amount of maroon and port colored stool evacuated suspect lower GI bleed. Positive nuclear medicine bleeding scan upper abdomen identified transverse colon and underwent Endo clip 04/28/2013. Hemoglobin and hematocrit remained stable. Physical occupational therapy evaluations completed recommendations of physical medicine rehabilitation consult   Review of Systems  Genitourinary: Positive for urgency.  Musculoskeletal: Positive for falls and myalgias.  All other systems reviewed and are negative.   Past Medical History  Diagnosis Date  . Hyperlipidemia   . Hypertension   . Arthritis   . Bladder cancer 2003  . Prediabetes   . Vitamin D deficiency   . BPH (benign prostatic hyperplasia)   . Skin cancer 2013    sq cell on scalp, basal cell on chest   Past Surgical History  Procedure  Laterality Date  . Cysto  2003  . Tonsillectomy    . Cataract extraction Right   . Esophagogastroduodenoscopy N/A 04/27/2013    Procedure: ESOPHAGOGASTRODUODENOSCOPY (EGD);  Surgeon: Lafayette Dragon, MD;  Location: [facility] ENDOSCOPY;  Service: Endoscopy;  Laterality: N/A;  . Enteroscopy N/A 04/28/2013    Procedure: ENTEROSCOPY;  Surgeon: Lafayette Dragon, MD;  Location: [facility] ENDOSCOPY;  Service: Endoscopy;  Laterality: N/A;  bedside   Family History  Problem Relation Age of Onset  . Alzheimer's disease Mother   . Heart disease Mother   . Heart disease Father    Social History:  reports that he has never smoked. He has never used smokeless tobacco. He reports that he does not drink alcohol or use illicit drugs. Allergies:  Allergies  Allergen Reactions  . Baycol [Cerivastatin]   . Zocor [Simvastatin]    Medications Prior to Admission  Medication Sig Dispense Refill  . amLODipine (NORVASC) 5 MG tablet Take 5 mg by mouth daily.      . benazepril (LOTENSIN) 40 MG tablet Take 40 mg by mouth daily.      . Cholecalciferol (VITAMIN D PO) Take 2 tablets by mouth daily.      Marland Kitchen gemfibrozil (LOPID) 600 MG tablet Take 600 mg by mouth 2 (two) times daily before a meal.      . oxymetazoline (AFRIN) 0.05 % nasal spray Place 1 spray into both nostrils 2 (two) times daily as needed for congestion.      Marland Kitchen terazosin (HYTRIN) 10 MG capsule Take 10 mg by mouth 2 (two) times daily.        Home: Home Living Family/patient expects to be discharged to:: Private residence Living Arrangements: Alone Available  Help at Discharge: Family;Available PRN/intermittently Type of Home: House Home Access: Level entry Home Layout: One level Home Equipment: None Additional Comments: Pt was taking care of himself with periodic assist from son prior to admission  Functional History:   Functional Status:  Mobility:     Ambulation/Gait Ambulation Distance (Feet): 100 Feet General Gait Details: cues for posture and position  in RW     ADL: ADL Eating/Feeding: Independent Where Assessed - Eating/Feeding: Bed level Grooming: Set up;Supervision/safety Where Assessed - Grooming: Unsupported sitting Upper Body Bathing: Set up;Supervision/safety Where Assessed - Upper Body Bathing: Unsupported sitting Lower Body Bathing: Maximal assistance Where Assessed - Lower Body Bathing: Supported sit to stand Upper Body Dressing: Minimal assistance Where Assessed - Upper Body Dressing: Unsupported sitting Lower Body Dressing: +1 Total assistance Where Assessed - Lower Body Dressing: Supported sit to Lobbyist: Minimal assistance Toilet Transfer Method: Sit to Loss adjuster, chartered: Bedside commode Transfers/Ambulation Related to ADLs: Min A sit<>stand and stand pivot from bed to recliner going to pt's right (did not try and ambulate further due to pt c/o increased pain in back and down RL  Cognition: Cognition Overall Cognitive Status: Within Functional Limits for tasks assessed Orientation Level: Oriented X4 Cognition Arousal/Alertness: Awake/alert Behavior During Therapy: Restless (due to pain in right hip) Overall Cognitive Status: Within Functional Limits for tasks assessed  Blood pressure 139/78, pulse 95, temperature 97.8 F (36.6 C), temperature source Oral, resp. rate 19, height 5\' 7"  (1.702 m), weight 85.3 kg (188 lb 0.8 oz), SpO2 95.00%. Physical Exam  Vitals reviewed. Constitutional: He is oriented to person, place, and time.  HENT:  Head: Normocephalic.  Eyes: EOM are normal.  Neck: Normal range of motion. Neck supple. No thyromegaly present.  Cardiovascular: Normal rate and regular rhythm.   Respiratory:  Decreased breath sounds at the bases.  GI: Soft. Bowel sounds are normal. He exhibits no distension. There is no tenderness.  Musculoskeletal:  Pain with PROM and AROM right hip, prox leg  Neurological: He is alert and oriented to person, place, and time. Coordination  normal.  Follows commands. CN normal. Fair insight and awareness. No visual field deficits. UE's 4/5 prox to distal. RHF 2-, KE 2+, ankle 4. LHF 3+, KE3+, ankle 4. No gross sensory deficits.  Skin:  Bruising to left periorbital area and arms    Results for orders placed during the hospital encounter of 04/25/13 (from the past 24 hour(s))  COMPREHENSIVE METABOLIC PANEL     Status: Abnormal   Collection Time    05/04/13  4:20 AM      Result Value Ref Range   Sodium 138  137 - 147 mEq/L   Potassium 3.4 (*) 3.7 - 5.3 mEq/L   Chloride 101  96 - 112 mEq/L   CO2 23  19 - 32 mEq/L   Glucose, Bld 239 (*) 70 - 99 mg/dL   BUN 14  6 - 23 mg/dL   Creatinine, Ser 0.65  0.50 - 1.35 mg/dL   Calcium 7.9 (*) 8.4 - 10.5 mg/dL   Total Protein 4.7 (*) 6.0 - 8.3 g/dL   Albumin 1.9 (*) 3.5 - 5.2 g/dL   AST 21  0 - 37 U/L   ALT 23  0 - 53 U/L   Alkaline Phosphatase 48  39 - 117 U/L   Total Bilirubin 0.6  0.3 - 1.2 mg/dL   GFR calc non Af Amer 84 (*) >90 mL/min   GFR calc Af Amer >90  >  90 mL/min  CBC WITH DIFFERENTIAL     Status: Abnormal   Collection Time    05/04/13  4:20 AM      Result Value Ref Range   WBC 9.5  4.0 - 10.5 K/uL   RBC 3.19 (*) 4.22 - 5.81 MIL/uL   Hemoglobin 9.8 (*) 13.0 - 17.0 g/dL   HCT 28.7 (*) 39.0 - 52.0 %   MCV 90.0  78.0 - 100.0 fL   MCH 30.7  26.0 - 34.0 pg   MCHC 34.1  30.0 - 36.0 g/dL   RDW 17.4 (*) 11.5 - 15.5 %   Platelets 127 (*) 150 - 400 K/uL   Neutrophils Relative % 84 (*) 43 - 77 %   Lymphocytes Relative 7 (*) 12 - 46 %   Monocytes Relative 8  3 - 12 %   Eosinophils Relative 1  0 - 5 %   Basophils Relative 0  0 - 1 %   Neutro Abs 7.9 (*) 1.7 - 7.7 K/uL   Lymphs Abs 0.7  0.7 - 4.0 K/uL   Monocytes Absolute 0.8  0.1 - 1.0 K/uL   Eosinophils Absolute 0.1  0.0 - 0.7 K/uL   Basophils Absolute 0.0  0.0 - 0.1 K/uL   RBC Morphology POLYCHROMASIA PRESENT     Smear Review PLATELETS APPEAR DECREASED    MAGNESIUM     Status: None   Collection Time    05/04/13   4:20 AM      Result Value Ref Range   Magnesium 1.7  1.5 - 2.5 mg/dL   Nm Gi Blood Loss  05/03/2013   CLINICAL DATA:  Bloody stool  EXAM: NUCLEAR MEDICINE GASTROINTESTINAL BLEEDING SCAN  TECHNIQUE: Sequential abdominal images were obtained following intravenous administration of Tc-63m labeled red blood cells.  RADIOPHARMACEUTICALS:  25MILLI CURIE ULTRATAG TECHNETIUM TC 69M-LABELED RED BLOOD CELLS IV KITmCi Tc-16m in-vitro labeled red cells.  COMPARISON:  None.  FINDINGS: Dynamic imaging was carried out for 120 knee min. No abnormal focus of increased radiotracer uptake is identified to suggest active GI bleeding.  IMPRESSION: 1. Negative exam.  No evidence for active GI bleeding.   Electronically Signed   By: Kerby Moors M.D.   On: 05/03/2013 14:06    Assessment/Plan: Diagnosis: Concussion with left orbital fx, deconditioning related to large GIB 1. Does the need for close, 24 hr/day medical supervision in concert with the patient's rehab needs make it unreasonable for this patient to be served in a less intensive setting? Yes 2. Co-Morbidities requiring supervision/potential complications: anemia, chf, NSTEMI 3. Due to bladder management, bowel management, safety, skin/wound care, disease management, medication administration, pain management and patient education, does the patient require 24 hr/day rehab nursing? Yes 4. Does the patient require coordinated care of a physician, rehab nurse, PT (1-2 hrs/day, 5 days/week) and OT (1-2 hrs/day, 5 days/week) to address physical and functional deficits in the context of the above medical diagnosis(es)? Yes Addressing deficits in the following areas: balance, endurance, locomotion, strength, transferring, bowel/bladder control, bathing, dressing, feeding, grooming, toileting and psychosocial support 5. Can the patient actively participate in an intensive therapy program of at least 3 hrs of therapy per day at least 5 days per week? Yes 6. The potential  for patient to make measurable gains while on inpatient rehab is excellent 7. Anticipated functional outcomes upon discharge from inpatient rehab are mod I with PT, mod I with OT, n/a with SLP. 8. Estimated rehab length of stay to reach the above functional  goals is: 7-9 days 9. Does the patient have adequate social supports to accommodate these discharge functional goals? Yes 10. Anticipated D/C setting: Home 11. Anticipated post D/C treatments: [place] therapy 12. Overall Rehab/Functional Prognosis: excellent  RECOMMENDATIONS: This patient's condition is appropriate for continued rehabilitative care in the following setting: CIR Patient has agreed to participate in recommended program. Yes Note that insurance prior authorization may be required for reimbursement for recommended care.  Comment: Rehab Admissions Coordinator to follow up.  Thanks,  Meredith Staggers, MD, Mellody Drown    05/04/2013

## 2013-05-04 NOTE — Progress Notes (Signed)
Daily Rounding Note  05/04/2013, 10:09 AM  LOS: 9 days   SUBJECTIVE:       Note discharge summary in chart.  Plan to send to SNF rehab facility or [facility], but not today.   Still with small smears of bloody stool overnight. No complaints   OBJECTIVE:         Vital signs in last 24 hours:    Temp:  [97.5 F (36.4 C)-98.3 F (36.8 C)] 97.9 F (36.6 C) (03/17 0800) Pulse Rate:  [95-101] 95 (03/16 2125) Resp:  [18-28] 19 (03/17 0800) BP: (136-162)/(71-78) 150/71 mmHg (03/17 0800) SpO2:  [83 %-96 %] 94 % (03/17 0800) Last BM Date: 05/04/13 General: looks frail and weak.  comfortable   Heart: RRR Chest: bil mild ronchi, mostly from upper airway.  Still clearing throat and coughing. Abdomen: soft, active BS, ND, NT  Extremities: no CCE Neuro/Psych:  Pleasant, not confused, relaxed.  Moves all 4s.   Intake/Output from previous day: 03/16 0701 - 03/17 0700 In: 960 [P.O.:480; I.V.:480] Out: 700 [Urine:700]  Intake/Output this shift: Total I/O In: 160 [P.O.:120; I.V.:40] Out: 100 [Urine:100]  Lab Results:  Recent Labs  05/02/13 0340 05/03/13 0410 05/04/13 0420  WBC 10.1 9.7 9.5  HGB 9.8* 9.9* 9.8*  HCT 28.4* 28.9* 28.7*  PLT 94* 125* 127*   BMET  Recent Labs  05/02/13 0340 05/03/13 0410 05/04/13 0420  NA 136* 140 138  K 4.3 4.1 3.4*  CL 104 106 101  CO2 21 24 23   GLUCOSE 166* 169* 239*  BUN 20 19 14   CREATININE 0.70 0.79 0.65  CALCIUM 7.5* 8.0* 7.9*   LFT  Recent Labs  05/02/13 0340 05/03/13 0410 05/04/13 0420  PROT 4.5* 4.8* 4.7*  ALBUMIN 1.9* 2.0* 1.9*  AST 27 24 21   ALT 26 27 23   ALKPHOS 51 48 48  BILITOT 0.4 0.5 0.6   Scheduled Meds: . antiseptic oral rinse  15 mL Mouth Rinse BID  . cholecalciferol  400 Units Oral Daily  . feeding supplement (RESOURCE BREEZE)  1 Container Oral BID BM  . insulin aspart  0-9 Units Subcutaneous 6 times per day  . magnesium oxide  400 mg Oral  Daily  . metoprolol tartrate  12.5 mg Oral BID  . pantoprazole  40 mg Oral Daily  . potassium chloride  20 mEq Oral Daily  . sodium chloride  500 mL Intravenous Once   Continuous Infusions: . sodium chloride Stopped (04/30/13 2000)  . dextrose 5 % and 0.2 % NaCl 20 mL/hr at 05/03/13 0727   PRN Meds:.acetaminophen, albuterol, diphenhydrAMINE, traMADol    ASSESMENT:   * GI bleed, diverticular bleed. Resolved post 3/11 embolization but recurrent 3/15, 3/16.  EGD 3/10: Small HH, no blood. No source for bleeding  NM RBC scan 3/11 bleeding tracer at RUQ/?jejunal location  Enteroscopy 3/11 past lig of treitz: No bleeding or lesions. endoclip place to help with location on future imaging and interventions.  Angiogram 3/11 with coil embolization of right colic branch of SMA.  NM RBC scan 3/16:  Negative.  * Non STEMI in setting of GIB, ABL with demand ischemia. No heparin, ASA, beta blocker due to GI bleed/shock. Work up on hold due to GIB. MD favoring medical mgt.  * ABL anemia. 9 units PRBCs thus far. Last on 3/14. Hgb is stable. Dr Radford Pax suggests keeping Hgb 9 or more.  *  Thrombocytopenia. New this admission. Has not had  splenic or GI imaging other than angiogram and RBC scan. .  * Orbital fracture post syncope related fall PTA.  * Hypoalbuminemia. On clears or NPO since 3/8.      PLAN   *  Continue to monitor.  CBC in AM *  Advanced to carb mod diet.     Azucena Freed  05/04/2013, 10:09 AM Pager: [phone]

## 2013-05-04 NOTE — Discharge Summary (Addendum)
Physician Discharge Summary  FOXX KLARICH DHW:[id number] DOB: 06-11-1922 DOA: 04/25/2013  PCP: Alesia Richards, MD  Admit date: 04/25/2013 Discharge date: 05/04/2013  Time spent: 40 minutes  Recommendations for Outpatient Follow-up:   Hypovolemic shock.  -Patient having a GI bleed overnight, and continued this morning.  -Systolic blood pressures falling into the 70s for which he was transferred to the intensive care unit.  -Pulmonary critical care medicine involved, started on IV pressors. (Levophed off 3/13)  -Will continue monitoring H&H, transfuse, IV fluid resuscitation.  -4 unit PRBC 04/27/2013  -3 Unit PRBC 04/28/2013  -1 unit PRBC 04/30/2013  -1 unit PRBC 05/01/2013  -3/13 Patient still does not have good by mouth intake, start LR at 75 ml/hr  -3/14 will stop LR and start D5 1/4NS at 46m/hr  -3/15 overnight patient developed some rhonchi will decrease [facility] 1[facility] -3/17 resolved, patient PO intake has improved significantly  Upper GI bleed  -Gastroenterology consulted  -nuclear medicine bleeding scan; see results below  -SMA embolization performed 04/28/2013 see results will  -Continue Protonix to 40 mg BID  -3/15 Patient's hemoglobin stable however had 2 large soft maroon stools per RN note overnight. Advised GI of findings, Dr. DDelfin Edis(GI) contact IR and discuss plan of care  -3/16 S/P Nuclear medicine gastrointestinal bleeding scan(see results below)  Acute hemorrhagic anemia  -Per cardiology maintain hemoglobin> 9 and is 6.2 overnight as he was typed and crossed and transfused with packed red blood cells  -Noted by nursing staff to have 2 large soft maroon stools overnight, rebleed? -3/17 Nuclear medicine gastrointestinal bleeding scan 05/03/2013 revealed no additional bleeding, and patient's H./H. has been stable. Appropriate for CIR   Syncope  -Initial CT scan of brain negative  -Troponins were cycled, come back positive with peak troponin of 1.55 as cardiology  was consulted.  -Patient was able to ambulate on 3/14-3/17 without any signs or symptoms of syncope. Should be able to participate in rehabilitation program at CIR   Hypotension  - Resolved patient off of Levophed recent 3/13  -3/13 Lactated Ringer's at 75 ml/hr (will also help with patient's hypokalemia)  -3/14 will stop LR and start D5 1/4NS at 791mhr  -3/15 [facility] 1/[facility]-Has [facility]   Elevated troponins/NSTEMI  -Could be secondary to demand ischemia from anemia  -Continue blood transfusions (patient received 4 unit PRBC 3/10)  -3/14 Cardiology following recommends transfuse patient to maintain hemoglobin> 9  -3/13 Restarted metoprolol at 12.5 mg BID  -3/14 patient's hemoglobin<9, will transfuse one unit PRBC  -3/17 hemoglobin holding stable   Diastolic CHF  -Mild; see elevated troponins   Hypokalemia  -Potassium goal> 69m31mL  -Replete and check in the a.m.  -3/17 [facility], will start patient on K. Dur 33m88maily   Hypomagnesemia  -Magnesium goal> 2mg/90m -Replete and check in the a.m.  -3/17 not within [facility] CHF, start patient on magnesium oxide 400 mg daily    Hyperlipidemia  -Will speak with son concerning patient's listed in tolerance of statins   Prediabetes  - Obtain hemoglobin A1c, - continue sliding-scale insulin   Nondisplaced Left periorbital fracture  - Evaluated by Dr. Teoh,Benjamine Molas not feel patient needs any surgical intervention at this time and follow up outpatient one week after D/C   Right hip pain  -continue tramadol PRN      Discharge Diagnoses:  Principal Problem:   Upper GI bleed Active Problems:  Hyperlipidemia   Hypertension   Prediabetes   Syncope   Orbital fracture   Anemia   Hemorrhage of gastrointestinal tract, unspecified   Hemorrhagic shock   NSTEMI (non-ST elevated myocardial infarction)   Nonspecific (abnormal) findings on radiological and other examination of gastrointestinal  tract   Diverticular hemorrhage   Diastolic CHF   Hypokalemia   Hypomagnesemia   Diverticulitis of colon (without mention of hemorrhage)   Hip pain, right   Discharge Condition: Stable  Diet recommendation: Heart healthy/carb modified  Filed Weights   04/27/13 0418 05/02/13 0333 05/03/13 0347  Weight: 79.334 kg (174 lb 14.4 oz) 87.3 kg (192 lb 7.4 oz) 85.3 kg (188 lb 0.8 oz)    History of present illness:  Patient is an 80 yo WM PMHx hypertension, dyslipidemia, anemia, admitted to the medicine service on 04/25/2013. Family members that reported that patient was highly functional, able to perform all activities of daily living. It appears on the morning of admission he may have had a syncopal event that resulted in head injury. He was brought to the emergency department where CT scan showed a left paravertebral fracture. Was seen and evaluated by ENT, patient not felt to warrant surgical intervention. On the evening of 04/26/2013 he had 3 episodes of bloody stools. Repeat hemoglobin and hematocrit came back low at 6.2 and 17.7. He was typed and crossed and transfused with one unit of packed red blood cells. On the following morning he was administered a second unit of packed red blood cells. At 11:40 AM on 04/27/2013 his blood pressures have remained stable having blood pressure 120/90 then he was sat up with a rapid fall in his blood pressures to 71/49. Transfusion of packed red blood cells were continued however despite this his blood pressures remained low and could not undergo upper endoscopy. At this point he was transferred to the intensive care unit where he was administered blood and started on IV pressors. Patient's son present at bedside was updated on condition. 3/12 patient in much better spirits today. Answers questions appropriately requested no when he can be discharged. 3/13 patient sitting in chair, eating a small amount of his breakfast. Mentation greatly improved, but still  borderline hypotensive off Levophed. Complains of right hip pain secondary to arthritis which is chronic 3/14 patient states right hip pain improved with the tramadol and is ready to start attempting to ambulate with physical therapies help. 3/15 patient states negative N./V./D. States the maroon-colored stools were not frank diarrhea however were soft and a significant amount. Negative lightheadedness, negative abdominal pain.3/16 S/P Nuclear medicine gastrointestinal bleeding scan(see results above). Patient with no complaints. 3/17 patient understands that he has been accepted to CIR, no complaints overnight    Consultants:  Dr. Delfin Edis (GI)  Dr. Merrie Roof (critical-care)     Procedures:  Nuclear medicine gastrointestinal bleeding scan 05/03/2013  1. Negative exam. No evidence for active GI bleeding.   IR EMBO ART VEN HEMORR LYMPH EXTRAV INC GUIDE ROADMAPPING [OMA0045 (Type: Custom)] 04/28/2013  Successful peripheral SMA rt colic branch 74m and 379mcoil embolization for an acute lower GI bleed angiographically in the hepatic flexure   Nuclear medicine GI blood cell scan 04/28/2013  Abnormal GI bleeding exam demonstrating what appears to be small  bowel localization of tracer in the upper mid abdomen in right upper  quadrant, question duodenal versus proximal jejunal origin.  No definite colonic localization of labeled erythrocytes is  identified.   EGD by Dr. BrOlevia Perches/11/2013  Small reducible hiatal hernia  No blood or bleeding from upper GI tract  No lesions to account for GIB  Shortly following completion of the EGD, pt evacuated large volume of maroon stool   Central venous catheter insertion 04/27/2013   Echocardiogram 04/26/2013  Left ventricle: The cavity size was normal. Wall thickness was normal.  -LVEF= 55% to 60%.  -(grade 1 diastolic dysfunction). - Aortic valve: AV is thickened, calcified with minimally restricted motion. - Mitral valve: Mild  regurgitation.  Antibiotics:     Discharge Exam: Filed Vitals:   05/04/13 0000 05/04/13 0324 05/04/13 0400 05/04/13 0800  BP: 136/76  139/78 150/71  Pulse:      Temp: 97.6 F (36.4 C)  97.8 F (36.6 C) 97.9 F (36.6 C)  TempSrc: Oral  Oral Oral  Resp: _0 Height:      Weight:      SpO2: 96% 95% 95% 94%   General: A./O. x4, NAD,  Cardiovascular: Regular rhythm and rate, negative murmurs rubs or gallops  Respiratory: Mild rhonchi right lower/right middle lobe to auscultation, left lobe clear to auscultation   Abdomen: Soft, nontender, nondistended, normal bowel sounds  Musculoskeletal: Negative pedal edema, , bruise under left eye consistent with fall/fracture slowly improving.    Discharge Instructions       Future Appointments Provider Department Dept Phone   05/06/2013 3:30 PM Christean Leaf [place] [facility] [phone]   07/22/2013 3:45 PM Unk Pinto, MD [place] [facility] [phone]       Medication List    STOP taking these medications       amLODipine 5 MG tablet  Commonly known as:  NORVASC     benazepril 40 MG tablet  Commonly known as:  LOTENSIN     gemfibrozil 600 MG tablet  Commonly known as:  LOPID      TAKE these medications       albuterol (2.5 MG/3ML) 0.083% nebulizer solution  Commonly known as:  PROVENTIL  Take 3 mLs (2.5 mg total) by nebulization every 6 (six) hours.     feeding supplement (RESOURCE BREEZE) Liqd  Take 1 Container by mouth 2 (two) times daily between meals.     insulin aspart 100 UNIT/ML injection  Commonly known as:  novoLOG  Inject 0-9 Units into the skin every 4 (four) hours.     magnesium oxide 400 (241.3 MG) MG tablet  Commonly known as:  MAG-OX  Take 1 tablet (400 mg total) by mouth daily.     metoprolol tartrate 12.5 mg Tabs tablet  Commonly known as:  LOPRESSOR  Take 0.5 tablets (12.5 mg total) by mouth 2 (two) times daily.      oxymetazoline 0.05 % nasal spray  Commonly known as:  AFRIN  Place 1 spray into both nostrils 2 (two) times daily as needed for congestion.     pantoprazole 40 MG tablet  Commonly known as:  PROTONIX  Take 1 tablet (40 mg total) by mouth daily.     potassium chloride SA 20 MEQ tablet  Commonly known as:  K-DUR,KLOR-CON  Take 1 tablet (20 mEq total) by mouth daily.     terazosin 10 MG capsule  Commonly known as:  HYTRIN  Take 10 mg by mouth 2 (two) times daily.     traMADol 50 MG tablet  Commonly known as:  ULTRAM  Take 1 tablet (50 mg total) by mouth every 6 (six) hours as needed for moderate  pain or severe pain.     VITAMIN D PO  Take 2 tablets by mouth daily.       Allergies  Allergen Reactions  . Baycol [Cerivastatin]   . Zocor [Simvastatin]    Follow-up Information   Follow up with Alesia Richards, MD In 2 weeks. [facility] followup)    Specialty:  Internal Medicine   Contact information:   [address] [place] [address] [phone]        The results of significant diagnostics from this hospitalization (including imaging, microbiology, ancillary and laboratory) are listed below for reference.    Significant Diagnostic Studies: Ct Head Wo Contrast  04/25/2013   CLINICAL DATA:  Fall.  Bruising to left orbit.  EXAM: CT HEAD WITHOUT CONTRAST  CT MAXILLOFACIAL WITHOUT CONTRAST  TECHNIQUE: Multidetector CT imaging of the head and maxillofacial structures were performed using the standard protocol without intravenous contrast. Multiplanar CT image reconstructions of the maxillofacial structures were also generated.  COMPARISON:  None.  FINDINGS: CT HEAD FINDINGS  Diffuse, extensive cerebral atrophy. Dilated extra-axial CSF spaces over the cerebral hemispheres bilaterally most likely related to the volume loss, or this could be related to chronic subdural hygromas. No hemorrhage. No mass lesion or acute infarction. No hydrocephalus.  No acute  calvarial abnormality. Left orbital fractures noted. See maxillofacial report below.  CT MAXILLOFACIAL FINDINGS  Fracture through the left lateral orbital wall and orbital floor. Probable nondisplaced zygomatic arch fracture on the left. Fracture also noted posteriorly within the lateral posterior orbital wall on image 68 of series 6. This is also noted on image 71 of series 6. The fracture line continues laterally into the sphenoid wing and superiorly into the squamosal portion of the left temporal bone.  Small amount of fluid noted in the left maxillary sinus. Remainder the paranasal sinuses are clear. Mastoids are clear. Globes are intact.  IMPRESSION: Diffuse cerebral atrophy. Prominent extra-axial low-density fluid likely related to the atrophy although these could represent chronic subdural hygromas.  No acute intracranial abnormality.  Complex left orbital fracture including anterior and posterior lateral walls, extending laterally into the sphenoid wing, and likely into the squamosal portion of the temporal bone. Left orbital floor fracture. Nondisplaced zygomatic arch fracture.   Electronically Signed   By: Rolm Baptise M.D.   On: 04/25/2013 17:32   Nm Gi Blood Loss  05/03/2013   CLINICAL DATA:  Bloody stool  EXAM: NUCLEAR MEDICINE GASTROINTESTINAL BLEEDING SCAN  TECHNIQUE: Sequential abdominal images were obtained following intravenous administration of Tc-54mlabeled red blood cells.  RADIOPHARMACEUTICALS:  25MILLI CURIE ULTRATAG TECHNETIUM TC 65M-LABELED RED BLOOD CELLS IV KITmCi Tc-983mn-vitro labeled red cells.  COMPARISON:  None.  FINDINGS: Dynamic imaging was carried out for 120 knee min. No abnormal focus of increased radiotracer uptake is identified to suggest active GI bleeding.  IMPRESSION: 1. Negative exam.  No evidence for active GI bleeding.   Electronically Signed   By: TaKerby Moors.D.   On: 05/03/2013 14:06   Nm Gi Blood Loss  04/28/2013   CLINICAL DATA Hematochezia  EXAM NUCLEAR  MEDICINE GASTROINTESTINAL BLEEDING SCAN  TECHNIQUE Sequential abdominal images were obtained following intravenous administration of Tc-9977mbeled red blood cells.  RADIOPHARMACEUTICALS 25 MILLI CURIE ULTRATAG TECHNETIUM TC 65M-LABELED RED BLOOD CELLS IV KIT  COMPARISON None  FINDINGS Abnormal red cell localization is identified in the right upper quadrant beginning at 31 min.  This abnormal tracer continues through multiple loops of bowel in the mid abdomen and  right upper quadrant.  This probably represents a duodenal or proximal jejunal bleeding source.  No tracer is seen along the expected course of the colon.  IMPRESSION Abnormal GI bleeding exam demonstrating what appears to be small bowel localization of tracer in the upper mid abdomen in right upper quadrant, question duodenal versus proximal jejunal origin.  No definite colonic localization of labeled erythrocytes is identified.  SIGNATURE  Electronically Signed   By: Lavonia Dana M.D.   On: 04/28/2013 13:14   Ir Angiogram Visceral Selective  04/29/2013   CLINICAL DATA:  Lower GI acute bleeding requiring blood transfusions, hypotensive, tachycardic, upper endoscopy negative, nuclear medicine bleeding scan positive for right colonic bleeding  EXAM: ULTRASOUND GUIDANCE VASCULAR ACCESS  ABDOMINAL FLUSH AORTOGRAM  SELECTIVE SMA ANGIOGRAM  SELECTIVE PERIPHERAL SMA RIGHT COLIC BRANCH MICRO CATHETERIZATION AND COIL EMBOLIZATION OF ACUTE ARTERIAL BLEED  Date:  3/12/20153/01/2014 12:19 AM  Radiologist:  Jerilynn Mages. Daryll Brod, MD  Guidance:  Ultrasound and fluoroscopic  FLUOROSCOPY TIME:  26 min 6 seconds  MEDICATIONS AND MEDICAL HISTORY: 2.5 mg Versed, 75 mcg fentanyl  ANESTHESIA/SEDATION: 110 min  CONTRAST:  169m OMNIPAQUE IOHEXOL 300 MG/ML  SOLN  COMPLICATIONS: No immediate  PROCEDURE: Informed consent was obtained from the patient following explanation of the procedure, risks, benefits and alternatives. The patient understands, agrees and consents for the  procedure. All questions were addressed. A time out was performed.  Maximal barrier sterile technique utilized including caps, mask, sterile gowns, sterile gloves, large sterile drape, hand hygiene, and Betadine.  Initially, under sterile conditions and local anesthesia, ultrasound right common femoral artery micropuncture access was performed. The micro guidewire would not advance centrally after 3 attempts. This was secondary to shelf-like plaque formation upon contrast injection. Access removed. Hemostasis obtained with manual compression. Attention directed to the left common femoral artery site. Under sterile conditions and local anesthesia, ultrasound micropuncture access performed of the left common femoral artery. Guidewire advanced easily. Five French sheath inserted over a Bentson guidewire. The pigtail catheter was inserted into the abdominal aorta. Flush aortogram performed.  Aortogram: Diffuse calcific atherosclerosis. Negative for aneurysm. Celiac, SMA, renal arteries and IMA all appear patent. On the delayed images, there is contrast extravasation in the right abdomen.  Pigtail catheter exchanged for a C2 catheter utilized to select the SMA origin. Selective SMA angiogram performed. There is active contrast extravasation compatible with arterial bleeding from a peripheral right colic branch at the level of the hepatic flexure.  A Renegade micro catheter and micro Glidewire were utilized to select peripheral right colic branches. Selective right colic angiograms performed throughout the hepatic flexure region of the colon. A peripheral right colic branch was eventually catheterize close to the area of active bleeding on previous angiograms. This area was interrogated extensively. 70 mcg nitroglycerin was also given to provoke bleeding in the region. Despite this, no definite active bleeding was demonstrated. However when correlating with the prior study this was close to the original extravasation  site. To decrease arterial pressure in the marginal branches, one 3 mm x 6 mm interlock coil was deployed within a peripheral right colic marginal branch.  Micro catheter was retracted. A second right colic branch was catheterized directed to the hepatic flexure. Angiogram of this area demonstrated collateral communication through marginal branches. From a peripheral right colic branch, active bleeding was still present.  The micro catheter was retracted and advanced into the active bleeding right colic branch over a Glidewire. Access was advanced peripheral within the right colic suspicious  branch. Selective angiogram within this third right colic branch confirmed active bleeding within the hepatic flexure of the colon with contrast opacifying the colonic lumen.  From this peripheral location, one 2 mm x 3 mm Interlock coil and two 2 mm x 4 cm interlock coils were deployed in the peripheral right colic branch. Following this, several repeat post embolization angiograms were repeated over approximately 10 min. No further active bleeding was demonstrated.  Catheter removed. Hemostasis obtained with a 6 [place] Starclose device. No immediate complication. Patient tolerated the procedure well.  IMPRESSION: Initial SMA angiogram confirms contrast extravasation from a peripheral right colic branch into the hepatic flexure of the colon compatible with arterial bleeding. This correlates with the nuclear medicine scan.  Peripheral micro catheter access was successful at accessing the peripheral SMA right colic branch to perform micro coil embolization. Post embolization, no further active bleeding could be demonstrated.   Electronically Signed   By: Daryll Brod M.D.   On: 04/29/2013 15:52   Ir Angiogram Selective Each Additional Vessel  04/29/2013   CLINICAL DATA:  Lower GI acute bleeding requiring blood transfusions, hypotensive, tachycardic, upper endoscopy negative, nuclear medicine bleeding scan positive for right  colonic bleeding  EXAM: ULTRASOUND GUIDANCE VASCULAR ACCESS  ABDOMINAL FLUSH AORTOGRAM  SELECTIVE SMA ANGIOGRAM  SELECTIVE PERIPHERAL SMA RIGHT COLIC BRANCH MICRO CATHETERIZATION AND COIL EMBOLIZATION OF ACUTE ARTERIAL BLEED  Date:  3/12/20153/01/2014 12:19 AM  Radiologist:  Jerilynn Mages. Daryll Brod, MD  Guidance:  Ultrasound and fluoroscopic  FLUOROSCOPY TIME:  26 min 6 seconds  MEDICATIONS AND MEDICAL HISTORY: 2.5 mg Versed, 75 mcg fentanyl  ANESTHESIA/SEDATION: 110 min  CONTRAST:  123m OMNIPAQUE IOHEXOL 300 MG/ML  SOLN  COMPLICATIONS: No immediate  PROCEDURE: Informed consent was obtained from the patient following explanation of the procedure, risks, benefits and alternatives. The patient understands, agrees and consents for the procedure. All questions were addressed. A time out was performed.  Maximal barrier sterile technique utilized including caps, mask, sterile gowns, sterile gloves, large sterile drape, hand hygiene, and Betadine.  Initially, under sterile conditions and local anesthesia, ultrasound right common femoral artery micropuncture access was performed. The micro guidewire would not advance centrally after 3 attempts. This was secondary to shelf-like plaque formation upon contrast injection. Access removed. Hemostasis obtained with manual compression. Attention directed to the left common femoral artery site. Under sterile conditions and local anesthesia, ultrasound micropuncture access performed of the left common femoral artery. Guidewire advanced easily. Five French sheath inserted over a Bentson guidewire. The pigtail catheter was inserted into the abdominal aorta. Flush aortogram performed.  Aortogram: Diffuse calcific atherosclerosis. Negative for aneurysm. Celiac, SMA, renal arteries and IMA all appear patent. On the delayed images, there is contrast extravasation in the right abdomen.  Pigtail catheter exchanged for a C2 catheter utilized to select the SMA origin. Selective SMA angiogram  performed. There is active contrast extravasation compatible with arterial bleeding from a peripheral right colic branch at the level of the hepatic flexure.  A Renegade micro catheter and micro Glidewire were utilized to select peripheral right colic branches. Selective right colic angiograms performed throughout the hepatic flexure region of the colon. A peripheral right colic branch was eventually catheterize close to the area of active bleeding on previous angiograms. This area was interrogated extensively. 70 mcg nitroglycerin was also given to provoke bleeding in the region. Despite this, no definite active bleeding was demonstrated. However when correlating with the prior study this was close to the original extravasation site. To decrease  arterial pressure in the marginal branches, one 3 mm x 6 mm interlock coil was deployed within a peripheral right colic marginal branch.  Micro catheter was retracted. A second right colic branch was catheterized directed to the hepatic flexure. Angiogram of this area demonstrated collateral communication through marginal branches. From a peripheral right colic branch, active bleeding was still present.  The micro catheter was retracted and advanced into the active bleeding right colic branch over a Glidewire. Access was advanced peripheral within the right colic suspicious branch. Selective angiogram within this third right colic branch confirmed active bleeding within the hepatic flexure of the colon with contrast opacifying the colonic lumen.  From this peripheral location, one 2 mm x 3 mm Interlock coil and two 2 mm x 4 cm interlock coils were deployed in the peripheral right colic branch. Following this, several repeat post embolization angiograms were repeated over approximately 10 min. No further active bleeding was demonstrated.  Catheter removed. Hemostasis obtained with a 6 [place] Starclose device. No immediate complication. Patient tolerated the procedure well.   IMPRESSION: Initial SMA angiogram confirms contrast extravasation from a peripheral right colic branch into the hepatic flexure of the colon compatible with arterial bleeding. This correlates with the nuclear medicine scan.  Peripheral micro catheter access was successful at accessing the peripheral SMA right colic branch to perform micro coil embolization. Post embolization, no further active bleeding could be demonstrated.   Electronically Signed   By: Daryll Brod M.D.   On: 04/29/2013 15:52   Ir Angiogram Follow Up Study  04/29/2013   CLINICAL DATA:  Lower GI acute bleeding requiring blood transfusions, hypotensive, tachycardic, upper endoscopy negative, nuclear medicine bleeding scan positive for right colonic bleeding  EXAM: ULTRASOUND GUIDANCE VASCULAR ACCESS  ABDOMINAL FLUSH AORTOGRAM  SELECTIVE SMA ANGIOGRAM  SELECTIVE PERIPHERAL SMA RIGHT COLIC BRANCH MICRO CATHETERIZATION AND COIL EMBOLIZATION OF ACUTE ARTERIAL BLEED  Date:  3/12/20153/01/2014 12:19 AM  Radiologist:  Jerilynn Mages. Daryll Brod, MD  Guidance:  Ultrasound and fluoroscopic  FLUOROSCOPY TIME:  26 min 6 seconds  MEDICATIONS AND MEDICAL HISTORY: 2.5 mg Versed, 75 mcg fentanyl  ANESTHESIA/SEDATION: 110 min  CONTRAST:  136m OMNIPAQUE IOHEXOL 300 MG/ML  SOLN  COMPLICATIONS: No immediate  PROCEDURE: Informed consent was obtained from the patient following explanation of the procedure, risks, benefits and alternatives. The patient understands, agrees and consents for the procedure. All questions were addressed. A time out was performed.  Maximal barrier sterile technique utilized including caps, mask, sterile gowns, sterile gloves, large sterile drape, hand hygiene, and Betadine.  Initially, under sterile conditions and local anesthesia, ultrasound right common femoral artery micropuncture access was performed. The micro guidewire would not advance centrally after 3 attempts. This was secondary to shelf-like plaque formation upon contrast injection.  Access removed. Hemostasis obtained with manual compression. Attention directed to the left common femoral artery site. Under sterile conditions and local anesthesia, ultrasound micropuncture access performed of the left common femoral artery. Guidewire advanced easily. Five French sheath inserted over a Bentson guidewire. The pigtail catheter was inserted into the abdominal aorta. Flush aortogram performed.  Aortogram: Diffuse calcific atherosclerosis. Negative for aneurysm. Celiac, SMA, renal arteries and IMA all appear patent. On the delayed images, there is contrast extravasation in the right abdomen.  Pigtail catheter exchanged for a C2 catheter utilized to select the SMA origin. Selective SMA angiogram performed. There is active contrast extravasation compatible with arterial bleeding from a peripheral right colic branch at the level of the hepatic flexure.  A  Renegade micro catheter and micro Glidewire were utilized to select peripheral right colic branches. Selective right colic angiograms performed throughout the hepatic flexure region of the colon. A peripheral right colic branch was eventually catheterize close to the area of active bleeding on previous angiograms. This area was interrogated extensively. 70 mcg nitroglycerin was also given to provoke bleeding in the region. Despite this, no definite active bleeding was demonstrated. However when correlating with the prior study this was close to the original extravasation site. To decrease arterial pressure in the marginal branches, one 3 mm x 6 mm interlock coil was deployed within a peripheral right colic marginal branch.  Micro catheter was retracted. A second right colic branch was catheterized directed to the hepatic flexure. Angiogram of this area demonstrated collateral communication through marginal branches. From a peripheral right colic branch, active bleeding was still present.  The micro catheter was retracted and advanced into the active  bleeding right colic branch over a Glidewire. Access was advanced peripheral within the right colic suspicious branch. Selective angiogram within this third right colic branch confirmed active bleeding within the hepatic flexure of the colon with contrast opacifying the colonic lumen.  From this peripheral location, one 2 mm x 3 mm Interlock coil and two 2 mm x 4 cm interlock coils were deployed in the peripheral right colic branch. Following this, several repeat post embolization angiograms were repeated over approximately 10 min. No further active bleeding was demonstrated.  Catheter removed. Hemostasis obtained with a 6 [place] Starclose device. No immediate complication. Patient tolerated the procedure well.  IMPRESSION: Initial SMA angiogram confirms contrast extravasation from a peripheral right colic branch into the hepatic flexure of the colon compatible with arterial bleeding. This correlates with the nuclear medicine scan.  Peripheral micro catheter access was successful at accessing the peripheral SMA right colic branch to perform micro coil embolization. Post embolization, no further active bleeding could be demonstrated.   Electronically Signed   By: Daryll Brod M.D.   On: 04/29/2013 15:52   Ir US Guide Vasc Access Left  04/29/2013   CLINICAL DATA:  Lower GI acute bleeding requiring blood transfusions, hypotensive, tachycardic, upper endoscopy negative, nuclear medicine bleeding scan positive for right colonic bleeding  EXAM: ULTRASOUND GUIDANCE VASCULAR ACCESS  ABDOMINAL FLUSH AORTOGRAM  SELECTIVE SMA ANGIOGRAM  SELECTIVE PERIPHERAL SMA RIGHT COLIC BRANCH MICRO CATHETERIZATION AND COIL EMBOLIZATION OF ACUTE ARTERIAL BLEED  Date:  3/12/20153/01/2014 12:19 AM  Radiologist:  Jerilynn Mages. Daryll Brod, MD  Guidance:  Ultrasound and fluoroscopic  FLUOROSCOPY TIME:  26 min 6 seconds  MEDICATIONS AND MEDICAL HISTORY: 2.5 mg Versed, 75 mcg fentanyl  ANESTHESIA/SEDATION: 110 min  CONTRAST:  171m OMNIPAQUE IOHEXOL  300 MG/ML  SOLN  COMPLICATIONS: No immediate  PROCEDURE: Informed consent was obtained from the patient following explanation of the procedure, risks, benefits and alternatives. The patient understands, agrees and consents for the procedure. All questions were addressed. A time out was performed.  Maximal barrier sterile technique utilized including caps, mask, sterile gowns, sterile gloves, large sterile drape, hand hygiene, and Betadine.  Initially, under sterile conditions and local anesthesia, ultrasound right common femoral artery micropuncture access was performed. The micro guidewire would not advance centrally after 3 attempts. This was secondary to shelf-like plaque formation upon contrast injection. Access removed. Hemostasis obtained with manual compression. Attention directed to the left common femoral artery site. Under sterile conditions and local anesthesia, ultrasound micropuncture access performed of the left common femoral artery. Guidewire advanced easily. Five FPakist[place]heath  inserted over a Bentson guidewire. The pigtail catheter was inserted into the abdominal aorta. Flush aortogram performed.  Aortogram: Diffuse calcific atherosclerosis. Negative for aneurysm. Celiac, SMA, renal arteries and IMA all appear patent. On the delayed images, there is contrast extravasation in the right abdomen.  Pigtail catheter exchanged for a C2 catheter utilized to select the SMA origin. Selective SMA angiogram performed. There is active contrast extravasation compatible with arterial bleeding from a peripheral right colic branch at the level of the hepatic flexure.  A Renegade micro catheter and micro Glidewire were utilized to select peripheral right colic branches. Selective right colic angiograms performed throughout the hepatic flexure region of the colon. A peripheral right colic branch was eventually catheterize close to the area of active bleeding on previous angiograms. This area was interrogated  extensively. 70 mcg nitroglycerin was also given to provoke bleeding in the region. Despite this, no definite active bleeding was demonstrated. However when correlating with the prior study this was close to the original extravasation site. To decrease arterial pressure in the marginal branches, one 3 mm x 6 mm interlock coil was deployed within a peripheral right colic marginal branch.  Micro catheter was retracted. A second right colic branch was catheterized directed to the hepatic flexure. Angiogram of this area demonstrated collateral communication through marginal branches. From a peripheral right colic branch, active bleeding was still present.  The micro catheter was retracted and advanced into the active bleeding right colic branch over a Glidewire. Access was advanced peripheral within the right colic suspicious branch. Selective angiogram within this third right colic branch confirmed active bleeding within the hepatic flexure of the colon with contrast opacifying the colonic lumen.  From this peripheral location, one 2 mm x 3 mm Interlock coil and two 2 mm x 4 cm interlock coils were deployed in the peripheral right colic branch. Following this, several repeat post embolization angiograms were repeated over approximately 10 min. No further active bleeding was demonstrated.  Catheter removed. Hemostasis obtained with a 6 [place] Starclose device. No immediate complication. Patient tolerated the procedure well.  IMPRESSION: Initial SMA angiogram confirms contrast extravasation from a peripheral right colic branch into the hepatic flexure of the colon compatible with arterial bleeding. This correlates with the nuclear medicine scan.  Peripheral micro catheter access was successful at accessing the peripheral SMA right colic branch to perform micro coil embolization. Post embolization, no further active bleeding could be demonstrated.   Electronically Signed   By: Daryll Brod M.D.   On: 04/29/2013 15:52    Dg Chest Port 1 View  04/27/2013   CLINICAL DATA A central line.  GI bleed both wall  EXAM PORTABLE CHEST - 1 VIEW  COMPARISON 10/13/2009  FINDINGS Examination is limited by rightward rotation. This is the best obtainable exam due to patient condition.  Right IJ catheter, tip at the mid to upper SVC. No pneumothorax or hemothorax. Cardiomegaly, comparison with prior limited due to above. Interstitial coarsening with interstitial crowding from hypoaeration.  IMPRESSION Right IJ catheter without adverse features.  SIGNATURE  Electronically Signed   By: Jorje Guild M.D.   On: 04/27/2013 15:56   [place] Guide Roadmapping  04/29/2013   CLINICAL DATA:  Lower GI acute bleeding requiring blood transfusions, hypotensive, tachycardic, upper endoscopy negative, nuclear medicine bleeding scan positive for right colonic bleeding  EXAM: ULTRASOUND GUIDANCE VASCULAR ACCESS  ABDOMINAL FLUSH AORTOGRAM  SELECTIVE SMA ANGIOGRAM  SELECTIVE PERIPHERAL SMA RIGHT  COLIC BRANCH MICRO CATHETERIZATION AND COIL EMBOLIZATION OF ACUTE ARTERIAL BLEED  Date:  3/12/20153/01/2014 12:19 AM  Radiologist:  Jerilynn Mages. Daryll Brod, MD  Guidance:  Ultrasound and fluoroscopic  FLUOROSCOPY TIME:  26 min 6 seconds  MEDICATIONS AND MEDICAL HISTORY: 2.5 mg Versed, 75 mcg fentanyl  ANESTHESIA/SEDATION: 110 min  CONTRAST:  121m OMNIPAQUE IOHEXOL 300 MG/ML  SOLN  COMPLICATIONS: No immediate  PROCEDURE: Informed consent was obtained from the patient following explanation of the procedure, risks, benefits and alternatives. The patient understands, agrees and consents for the procedure. All questions were addressed. A time out was performed.  Maximal barrier sterile technique utilized including caps, mask, sterile gowns, sterile gloves, large sterile drape, hand hygiene, and Betadine.  Initially, under sterile conditions and local anesthesia, ultrasound right common femoral artery micropuncture access was performed. The micro  guidewire would not advance centrally after 3 attempts. This was secondary to shelf-like plaque formation upon contrast injection. Access removed. Hemostasis obtained with manual compression. Attention directed to the left common femoral artery site. Under sterile conditions and local anesthesia, ultrasound micropuncture access performed of the left common femoral artery. Guidewire advanced easily. Five French sheath inserted over a Bentson guidewire. The pigtail catheter was inserted into the abdominal aorta. Flush aortogram performed.  Aortogram: Diffuse calcific atherosclerosis. Negative for aneurysm. Celiac, SMA, renal arteries and IMA all appear patent. On the delayed images, there is contrast extravasation in the right abdomen.  Pigtail catheter exchanged for a C2 catheter utilized to select the SMA origin. Selective SMA angiogram performed. There is active contrast extravasation compatible with arterial bleeding from a peripheral right colic branch at the level of the hepatic flexure.  A Renegade micro catheter and micro Glidewire were utilized to select peripheral right colic branches. Selective right colic angiograms performed throughout the hepatic flexure region of the colon. A peripheral right colic branch was eventually catheterize close to the area of active bleeding on previous angiograms. This area was interrogated extensively. 70 mcg nitroglycerin was also given to provoke bleeding in the region. Despite this, no definite active bleeding was demonstrated. However when correlating with the prior study this was close to the original extravasation site. To decrease arterial pressure in the marginal branches, one 3 mm x 6 mm interlock coil was deployed within a peripheral right colic marginal branch.  Micro catheter was retracted. A second right colic branch was catheterized directed to the hepatic flexure. Angiogram of this area demonstrated collateral communication through marginal branches. From a  peripheral right colic branch, active bleeding was still present.  The micro catheter was retracted and advanced into the active bleeding right colic branch over a Glidewire. Access was advanced peripheral within the right colic suspicious branch. Selective angiogram within this third right colic branch confirmed active bleeding within the hepatic flexure of the colon with contrast opacifying the colonic lumen.  From this peripheral location, one 2 mm x 3 mm Interlock coil and two 2 mm x 4 cm interlock coils were deployed in the peripheral right colic branch. Following this, several repeat post embolization angiograms were repeated over approximately 10 min. No further active bleeding was demonstrated.  Catheter removed. Hemostasis obtained with a 6 [place] device. No immediate complication. Patient tolerated the procedure well.  IMPRESSION: Initial SMA angiogram confirms contrast extravasation from a peripheral right colic branch into the hepatic flexure of the colon compatible with arterial bleeding. This correlates with the nuclear medicine scan.  Peripheral micro catheter access was successful at accessing the peripheral SMA  right colic branch to perform micro coil embolization. Post embolization, no further active bleeding could be demonstrated.   Electronically Signed   By: Daryll Brod M.D.   On: 04/29/2013 15:52   Ct Maxillofacial Wo Cm  04/25/2013   CLINICAL DATA:  Fall.  Bruising to left orbit.  EXAM: CT HEAD WITHOUT CONTRAST  CT MAXILLOFACIAL WITHOUT CONTRAST  TECHNIQUE: Multidetector CT imaging of the head and maxillofacial structures were performed using the standard protocol without intravenous contrast. Multiplanar CT image reconstructions of the maxillofacial structures were also generated.  COMPARISON:  None.  FINDINGS: CT HEAD FINDINGS  Diffuse, extensive cerebral atrophy. Dilated extra-axial CSF spaces over the cerebral hemispheres bilaterally most likely related to the volume loss, or  this could be related to chronic subdural hygromas. No hemorrhage. No mass lesion or acute infarction. No hydrocephalus.  No acute calvarial abnormality. Left orbital fractures noted. See maxillofacial report below.  CT MAXILLOFACIAL FINDINGS  Fracture through the left lateral orbital wall and orbital floor. Probable nondisplaced zygomatic arch fracture on the left. Fracture also noted posteriorly within the lateral posterior orbital wall on image 68 of series 6. This is also noted on image 71 of series 6. The fracture line continues laterally into the sphenoid wing and superiorly into the squamosal portion of the left temporal bone.  Small amount of fluid noted in the left maxillary sinus. Remainder the paranasal sinuses are clear. Mastoids are clear. Globes are intact.  IMPRESSION: Diffuse cerebral atrophy. Prominent extra-axial low-density fluid likely related to the atrophy although these could represent chronic subdural hygromas.  No acute intracranial abnormality.  Complex left orbital fracture including anterior and posterior lateral walls, extending laterally into the sphenoid wing, and likely into the squamosal portion of the temporal bone. Left orbital floor fracture. Nondisplaced zygomatic arch fracture.   Electronically Signed   By: Rolm Baptise M.D.   On: 04/25/2013 17:32    Microbiology: Recent Results (from the past 240 hour(s))  MRSA PCR SCREENING     Status: None   Collection Time    04/27/13  2:38 PM      Result Value Ref Range Status   MRSA by PCR NEGATIVE  NEGATIVE Final   Comment:            The GeneXpert MRSA Assay (FDA     approved for NASAL specimens     only), is one component of a     comprehensive MRSA colonization     surveillance program. It is not     intended to diagnose MRSA     infection nor to guide or     monitor treatment for     MRSA infections.     Labs: Basic Metabolic Panel:  Recent Labs Lab 04/30/13 0500 05/01/13 0500 05/02/13 0340 05/03/13 0410  05/04/13 0420  NA 140 140 136* 140 138  K 3.5* 4.7 4.3 4.1 3.4*  CL 107 110 104 106 101  CO2 _0 GLUCOSE 179* 137* 166* 169* 239*  BUN 24* 25* _1 CREATININE 0.85 0.83 0.70 0.79 0.65  CALCIUM 7.2* 7.4* 7.5* 8.0* 7.9*  MG 2.1 2.3 2.0 1.9 1.7   Liver Function Tests:  Recent Labs Lab 04/30/13 0500 05/01/13 0500 05/02/13 0340 05/03/13 0410 05/04/13 0420  AST 40* _2 ALT _3 ALKPHOS 33* 35* 51 48 48  BILITOT 0.5 0.4 0.4 0.5 0.6  PROT 4.0* 4.2* 4.5* 4.8* 4.7*  ALBUMIN 2.0* 1.9* 1.9* 2.0* 1.9*   No results found for this basename: LIPASE, AMYLASE,  in the last 168 hours No results found for this basename: AMMONIA,  in the last 168 hours CBC:  Recent Labs Lab 04/30/13 0500 05/01/13 0500 05/01/13 2222 05/02/13 0340 05/03/13 0410 05/04/13 0420  WBC 10.4 7.8 10.3 10.1 9.7 9.5  NEUTROABS 8.7* 6.7  --  8.9* 8.0* 7.9*  HGB 7.7* 8.1* 10.0* 9.8* 9.9* 9.8*  HCT 21.3* 22.8* 28.7* 28.4* 28.9* 28.7*  MCV 88.4 89.1 88.9 89.6 89.5 90.0  PLT 89* 73* 91* 94* 125* 127*   Cardiac Enzymes:  Recent Labs Lab 04/28/13 1046 04/29/13 0030 04/29/13 0730  TROPONINI 15.27* 19.26* 14.25*   BNP: BNP (last 3 results)  Recent Labs  04/29/13 0730  PROBNP 4803.0*   CBG:  Recent Labs Lab 04/28/13 1818 04/28/13 2027 04/29/13 0105 04/29/13 0346 04/29/13 0810  GLUCAP 166* 155* 173* 173* 135*       Signed:  Dia Crawford, MD [facility] [phone] pager   Patient seen and evaluated.  Plan is for patient to transition to [facility] for continued rehab.  Chart reviewed and patient ok for discharge with supplemental oxygen and continued rehab.  Cebert Dettmann, [employer]

## 2013-05-04 NOTE — Progress Notes (Signed)
[facility] unable to offer a bed because they will not get [employer] authorization in time and they are already tight on beds. Pt and son decided [facility] is the second-choice for rehab. I called [facility] informing them that [facility] is the plan if pt is ready for discharge tomorrow, and called [facility] and they are working on insurance authorization today and will inform me when they get auth. I will update pt and son tomorrow when I hear back from [facility].   Ky Barban, MSW, [facility] Clinical Social Worker [phone]

## 2013-05-05 LAB — CBC WITH DIFFERENTIAL/PLATELET
BASOS PCT: 0 % (ref 0–1)
Basophils Absolute: 0 10*3/uL (ref 0.0–0.1)
Eosinophils Absolute: 0.2 10*3/uL (ref 0.0–0.7)
Eosinophils Relative: 2 % (ref 0–5)
HEMATOCRIT: 28.4 % — AB (ref 39.0–52.0)
HEMOGLOBIN: 9.6 g/dL — AB (ref 13.0–17.0)
Lymphocytes Relative: 9 % — ABNORMAL LOW (ref 12–46)
Lymphs Abs: 0.9 10*3/uL (ref 0.7–4.0)
MCH: 30.9 pg (ref 26.0–34.0)
MCHC: 33.8 g/dL (ref 30.0–36.0)
MCV: 91.3 fL (ref 78.0–100.0)
MONOS PCT: 7 % (ref 3–12)
Monocytes Absolute: 0.7 10*3/uL (ref 0.1–1.0)
NEUTROS ABS: 7.8 10*3/uL — AB (ref 1.7–7.7)
Neutrophils Relative %: 82 % — ABNORMAL HIGH (ref 43–77)
Platelets: 136 10*3/uL — ABNORMAL LOW (ref 150–400)
RBC: 3.11 MIL/uL — ABNORMAL LOW (ref 4.22–5.81)
RDW: 17.7 % — ABNORMAL HIGH (ref 11.5–15.5)
WBC: 9.6 10*3/uL (ref 4.0–10.5)

## 2013-05-05 LAB — COMPREHENSIVE METABOLIC PANEL
ALT: 22 U/L (ref 0–53)
AST: 21 U/L (ref 0–37)
Albumin: 1.9 g/dL — ABNORMAL LOW (ref 3.5–5.2)
Alkaline Phosphatase: 51 U/L (ref 39–117)
BUN: 18 mg/dL (ref 6–23)
CALCIUM: 8.1 mg/dL — AB (ref 8.4–10.5)
CO2: 25 mEq/L (ref 19–32)
Chloride: 102 mEq/L (ref 96–112)
Creatinine, Ser: 0.73 mg/dL (ref 0.50–1.35)
GFR calc Af Amer: >90 mL/min (ref >90)
GFR calc non Af Amer: 80 mL/min — ABNORMAL LOW (ref >90)
GLUCOSE: 136 mg/dL — AB (ref 70–99)
Potassium: 3.7 mEq/L (ref 3.7–5.3)
SODIUM: 139 meq/L (ref 137–147)
Total Bilirubin: 0.4 mg/dL (ref 0.3–1.2)
Total Protein: 4.9 g/dL — ABNORMAL LOW (ref 6.0–8.3)

## 2013-05-05 LAB — MAGNESIUM: MAGNESIUM: 1.9 mg/dL (ref 1.5–2.5)

## 2013-05-05 LAB — GLUCOSE, CAPILLARY
GLUCOSE-CAPILLARY: 150 mg/dL — AB (ref 70–99)
GLUCOSE-CAPILLARY: 114 mg/dL — AB (ref 70–99)
GLUCOSE-CAPILLARY: 233 mg/dL — AB (ref 70–99)
Glucose-Capillary: 124 mg/dL — ABNORMAL HIGH (ref 70–99)
Glucose-Capillary: 142 mg/dL — ABNORMAL HIGH (ref 70–99)

## 2013-05-05 NOTE — Progress Notes (Signed)
Rehab admissions - I spoke with social worker yesterday.  Patient has Psychologist, prison and probation services.  It is highly unlikely that we could get authorization for acute inpatient rehab admission.  I have advised that social worker to proceed with SNF placement in light of likelihood for [employer] denial.  Call me for questions.  #[phone]

## 2013-05-05 NOTE — Progress Notes (Signed)
Notified Md about pts back pain.  Still having after medication.  Pt states " heat helps"  Applied hot pack to back and awaiting orders from MD.  VS stable . Will continue to monitor. Saunders Revel T

## 2013-05-05 NOTE — Progress Notes (Signed)
I received a call from admissions at [facility] requesting discharge summary. I have uploaded this to administrator and asked her to call me as soon as she gets insurance auth. When she gets auth, I will schedule [facility] to pick up patient and coordinate with RN.   Ky Barban, MSW, [facility] Clinical Social Worker [phone]

## 2013-05-05 NOTE — Progress Notes (Addendum)
I received a call from pt's son Louie Casa, explaining [facility] has a private room for pt as long as he can get to the facility by 1pm to fill out paperwork. Louie Casa asked if there is any chance pt can get into [facility]; after speaking with [facility] admissions yesterday, [facility] had one bed available and [facility] stated they would not be able to get insurance authorization in one day. Louie Casa went to [employer] yesterday and spoke with administrator, who states pt can have a private room if Louie Casa can fill out paperwork by 1pm today. I called MD, who confirmed pt is discharging today. I informed MD that FL2 is on the chart to be signed, and I will compile discharge packet once this is finished. I called [facility] and left administrator a voicemail informing her pt is discharging today, and requested a call back. Louie Casa to call me back in 30 minutes, I will update him on progress.  Addendum: I went to pt's room to discuss his code status, explaining the difference between full code and DNR. Pt explained if his heart stopped in the EMS vehicle on the way to SNF, he would not want resuscitation. After re-iterating the difference between full code and DNR, pt states he wants to be a DNR. I had MD fill out DNR form, and will explain to pt that this will apply to his transport to SNF. I called pt's son Louie Casa and explained the conversation I had with pt about code status, and son expressed understanding and states he would like me to arrange for [facility] to transport pt to facility. Louie Casa is going over to [facility] to complete admissions paperwork for pt. I have discharge packet complete and I will schedule [facility] and coordinate with RN as soon as facility gives me insurance authorization.  Ky Barban, MSW, [facility] Clinical Social Worker [phone]

## 2013-05-05 NOTE — Progress Notes (Signed)
I informed pt he is discharging to [facility] today and that his son has completed paperwork and they have a room waiting for him. RN aware of discharge and [facility] scheduled for the next available pickup. RN has called report to [facility], and I said goodbye to pt. Pt thanked me for helping with rehab placement. I called his son and left a voicemail letting him know of impending discharge and that I have arranged for transport. I am signing off.   Ky Barban, MSW, [facility] Clinical Social Worker [phone]

## 2013-05-06 ENCOUNTER — Ambulatory Visit: Payer: Self-pay | Admitting: Emergency Medicine

## 2013-05-19 DEATH — deceased

## 2013-07-22 ENCOUNTER — Encounter: Payer: Self-pay | Admitting: Internal Medicine

## 2014-04-18 IMAGING — NM NM GI BLOOD LOSS
2 series · 12 of 12 positions shown · non-contrast
Comparison: None.

CLINICAL DATA: Bloody stool

EXAM:
NUCLEAR MEDICINE GASTROINTESTINAL BLEEDING SCAN
TECHNIQUE: Sequential abdominal images were obtained following intravenous
administration of 7c-SSm labeled red blood cells.
RADIOPHARMACEUTICALS:  [id number] TIGER ULTRATAG TECHNETIUM TC
[id number] RED BLOOD CELLS IV KITmCi 7c-SSm in-vitro labeled red
cells.

[gi gi bleed · 5.01mm/px · 6 of 60 frames shown (1 of 2)]
[frame 6/60]
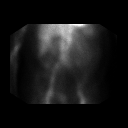
[frame 16/60]
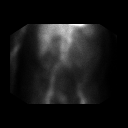
[frame 26/60]
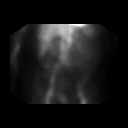
[frame 36/60]
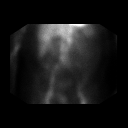
[frame 46/60]
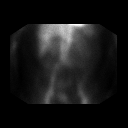
[frame 56/60]
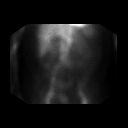

[gi gi bleed · 5.01mm/px · 6 of 60 frames shown (2 of 2)]
[frame 6/60]
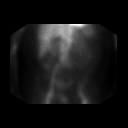
[frame 16/60]
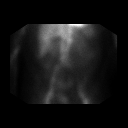
[frame 26/60]
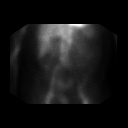
[frame 36/60]
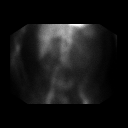
[frame 46/60]
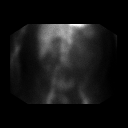
[frame 56/60]
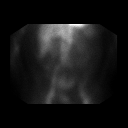

[12 of 12 positions shown; findings below may reference images not displayed]

FINDINGS: Dynamic imaging was carried [DATE] knee min. No abnormal focus
of increased radiotracer uptake is identified to suggest active GI
bleeding.
IMPRESSION: 1. Negative exam.  No evidence for active GI bleeding.
